# Patient Record
Sex: Female | Born: 1975
Health system: Southern US, Community
[De-identification: ages and names within clinical notes are randomized; demographics above are authoritative.]

## PROBLEM LIST (undated history)

## (undated) DIAGNOSIS — A159 Respiratory tuberculosis unspecified: Secondary | ICD-10-CM

## (undated) DIAGNOSIS — I10 Essential (primary) hypertension: Secondary | ICD-10-CM

## (undated) DIAGNOSIS — M1990 Unspecified osteoarthritis, unspecified site: Secondary | ICD-10-CM

## (undated) DIAGNOSIS — K219 Gastro-esophageal reflux disease without esophagitis: Secondary | ICD-10-CM

## (undated) DIAGNOSIS — D573 Sickle-cell trait: Secondary | ICD-10-CM

## (undated) DIAGNOSIS — E119 Type 2 diabetes mellitus without complications: Secondary | ICD-10-CM

## (undated) DIAGNOSIS — J45909 Unspecified asthma, uncomplicated: Secondary | ICD-10-CM

## (undated) DIAGNOSIS — G4730 Sleep apnea, unspecified: Secondary | ICD-10-CM

## (undated) DIAGNOSIS — E785 Hyperlipidemia, unspecified: Secondary | ICD-10-CM

## (undated) DIAGNOSIS — R42 Dizziness and giddiness: Secondary | ICD-10-CM

## (undated) HISTORY — DX: Sickle-cell trait: D57.3

## (undated) HISTORY — DX: Type 2 diabetes mellitus without complications: E11.9

## (undated) HISTORY — DX: Dizziness and giddiness: R42

## (undated) HISTORY — DX: Unspecified osteoarthritis, unspecified site: M19.90

## (undated) HISTORY — DX: Essential (primary) hypertension: I10

## (undated) HISTORY — PX: HAND SURGERY: SHX662

## (undated) HISTORY — DX: Unspecified asthma, uncomplicated: J45.909

## (undated) HISTORY — DX: Hyperlipidemia, unspecified: E78.5

---

## 2009-11-01 HISTORY — PX: LUMBAR DISC SURGERY: SHX700

## 2014-09-17 DIAGNOSIS — N926 Irregular menstruation, unspecified: Secondary | ICD-10-CM | POA: Insufficient documentation

## 2018-02-28 ENCOUNTER — Encounter: Payer: Medicare Other | Attending: Internal Medicine | Admitting: Dietician

## 2018-02-28 DIAGNOSIS — Z6841 Body Mass Index (BMI) 40.0 and over, adult: Secondary | ICD-10-CM | POA: Diagnosis not present

## 2018-02-28 DIAGNOSIS — E119 Type 2 diabetes mellitus without complications: Secondary | ICD-10-CM | POA: Insufficient documentation

## 2018-02-28 DIAGNOSIS — Z713 Dietary counseling and surveillance: Secondary | ICD-10-CM | POA: Insufficient documentation

## 2018-03-01 NOTE — Progress Notes (Signed)
Patient was seen on 02/28/18 for the first of a series of three diabetes self-management courses at the [facility].  Patient Education Plan per assessed needs and concerns is to attend three course education program for Diabetes Self Management Education.  The following learning objectives were met by the patient during this class:  Describe diabetes  State some common risk factors for diabetes  Defines the role of glucose and insulin  Identifies type of diabetes and pathophysiology  Describe the relationship between diabetes and cardiovascular risk  State the members of the Healthcare Team  States the rationale for glucose monitoring  State when to test glucose  State their individual Target Range  State the importance of logging glucose readings  Describe how to interpret glucose readings  Identifies A1C target  Explain the correlation between A1c and eAG values  State symptoms and treatment of high blood glucose  State symptoms and treatment of low blood glucose  Explain proper technique for glucose testing  Identifies proper sharps disposal  Handouts given during class include:  ADA Diabetes You Take Control   Carb Counting and Meal Planning book  Meal Plan Card  Meal planning worksheet  Low Sodium Flavoring Tips  Types of Fats  The diabetes portion plate  A1c to eAG Conversion Chart  Diabetes Recommended Care Schedule  Support Group  Diabetes Success Plan  Core Class Satisfaction Survey   Follow-Up Plan:  Attend core 2   

## 2018-03-02 ENCOUNTER — Encounter: Payer: Self-pay | Admitting: Dietician

## 2018-03-07 ENCOUNTER — Encounter: Payer: Medicare HMO | Attending: Internal Medicine | Admitting: Dietician

## 2018-03-07 DIAGNOSIS — Z713 Dietary counseling and surveillance: Secondary | ICD-10-CM | POA: Diagnosis not present

## 2018-03-07 DIAGNOSIS — E119 Type 2 diabetes mellitus without complications: Secondary | ICD-10-CM

## 2018-03-07 DIAGNOSIS — Z6841 Body Mass Index (BMI) 40.0 and over, adult: Secondary | ICD-10-CM | POA: Insufficient documentation

## 2018-03-07 NOTE — Progress Notes (Signed)
Patient was seen on 03/07/18 for the second of a series of three diabetes self-management courses at the [facility]. The following learning objectives were met by the patient during this class:   Describe the role of different macronutrients on glucose  Explain how carbohydrates affect blood glucose  State what foods contain the most carbohydrates  Demonstrate carbohydrate counting  Demonstrate how to read Nutrition Facts food label  Describe effects of various fats on heart health  Describe the importance of good nutrition for health and healthy eating strategies  Describe techniques for managing your shopping, cooking and meal planning  List strategies to follow meal plan when dining out  Describe the effects of alcohol on glucose and how to use it safely  Goals:  Follow Diabetes Meal Plan as instructed  Aim to spread carbs evenly throughout the day  Aim for 3 meals per day and snacks as needed Include lean protein foods to meals/snacks  Monitor glucose levels as instructed by your doctor   Follow-Up Plan:  Attend Core 3  Work towards following your personal food plan.   

## 2018-03-14 ENCOUNTER — Ambulatory Visit: Payer: Self-pay

## 2018-03-20 DIAGNOSIS — R6889 Other general symptoms and signs: Secondary | ICD-10-CM | POA: Diagnosis not present

## 2018-03-20 DIAGNOSIS — M545 Low back pain: Secondary | ICD-10-CM | POA: Diagnosis not present

## 2018-03-20 DIAGNOSIS — Z79899 Other long term (current) drug therapy: Secondary | ICD-10-CM | POA: Diagnosis not present

## 2018-03-20 DIAGNOSIS — G8929 Other chronic pain: Secondary | ICD-10-CM | POA: Diagnosis not present

## 2018-03-20 DIAGNOSIS — G9050 Complex regional pain syndrome I, unspecified: Secondary | ICD-10-CM | POA: Diagnosis not present

## 2018-03-21 DIAGNOSIS — R6889 Other general symptoms and signs: Secondary | ICD-10-CM | POA: Diagnosis not present

## 2018-03-24 DIAGNOSIS — R6889 Other general symptoms and signs: Secondary | ICD-10-CM | POA: Diagnosis not present

## 2018-03-24 DIAGNOSIS — E1165 Type 2 diabetes mellitus with hyperglycemia: Secondary | ICD-10-CM | POA: Diagnosis not present

## 2018-03-28 DIAGNOSIS — R6889 Other general symptoms and signs: Secondary | ICD-10-CM | POA: Diagnosis not present

## 2018-03-30 DIAGNOSIS — R6889 Other general symptoms and signs: Secondary | ICD-10-CM | POA: Diagnosis not present

## 2018-03-31 DIAGNOSIS — R6889 Other general symptoms and signs: Secondary | ICD-10-CM | POA: Diagnosis not present

## 2018-04-03 ENCOUNTER — Encounter: Payer: Self-pay | Admitting: Neurology

## 2018-04-03 DIAGNOSIS — G9050 Complex regional pain syndrome I, unspecified: Secondary | ICD-10-CM | POA: Diagnosis not present

## 2018-04-03 DIAGNOSIS — Z79899 Other long term (current) drug therapy: Secondary | ICD-10-CM | POA: Diagnosis not present

## 2018-04-03 DIAGNOSIS — R6889 Other general symptoms and signs: Secondary | ICD-10-CM | POA: Diagnosis not present

## 2018-04-03 DIAGNOSIS — G8929 Other chronic pain: Secondary | ICD-10-CM | POA: Diagnosis not present

## 2018-04-03 DIAGNOSIS — M545 Low back pain: Secondary | ICD-10-CM | POA: Diagnosis not present

## 2018-04-04 ENCOUNTER — Ambulatory Visit (INDEPENDENT_AMBULATORY_CARE_PROVIDER_SITE_OTHER): Payer: Medicare HMO | Admitting: Neurology

## 2018-04-04 ENCOUNTER — Encounter: Payer: Self-pay | Admitting: Neurology

## 2018-04-04 VITALS — BP 160/107 | HR 88 | Ht 63.0 in | Wt 270.0 lb

## 2018-04-04 DIAGNOSIS — T4275XA Adverse effect of unspecified antiepileptic and sedative-hypnotic drugs, initial encounter: Secondary | ICD-10-CM

## 2018-04-04 DIAGNOSIS — F513 Sleepwalking [somnambulism]: Secondary | ICD-10-CM

## 2018-04-04 DIAGNOSIS — R6889 Other general symptoms and signs: Secondary | ICD-10-CM | POA: Diagnosis not present

## 2018-04-04 DIAGNOSIS — G4719 Other hypersomnia: Secondary | ICD-10-CM

## 2018-04-04 DIAGNOSIS — Z9189 Other specified personal risk factors, not elsewhere classified: Secondary | ICD-10-CM

## 2018-04-04 DIAGNOSIS — R0683 Snoring: Secondary | ICD-10-CM | POA: Diagnosis not present

## 2018-04-04 DIAGNOSIS — F19982 Other psychoactive substance use, unspecified with psychoactive substance-induced sleep disorder: Secondary | ICD-10-CM

## 2018-04-04 DIAGNOSIS — R5383 Other fatigue: Secondary | ICD-10-CM

## 2018-04-04 DIAGNOSIS — Z6841 Body Mass Index (BMI) 40.0 and over, adult: Secondary | ICD-10-CM | POA: Diagnosis not present

## 2018-04-04 HISTORY — DX: Other fatigue: R53.83

## 2018-04-04 NOTE — Progress Notes (Signed)
[facility]INE CL[facility]IC   Provider:  Larey Seat, [place] D  Primary Care Physician:  Merrilee Seashore, MD   Referring Provider: Merrilee Seashore, MD    Chief Complaint  Patient presents with  . New Patient (Initial Visit)    Rm 11.     HPI:  Lindsay Ellis is a 42 y.o. female patient  , seen here as in a referral on 04-04-2018  from Dr. [name] for a sleep apnea work up. The patient is excessively daytime sleepy.   Lindsay Ellis presents today as a 42 year old African-American right-handed female currently wearing a hand brace wrist brace for the treatment of  RSD. She has struggled with diabetes and obesity for much of her adult life, and recently moved to [place] in October 2018.  She lived in [place] before for several years earlier she had undergone a sleep study and was diagnosed with obstructive sleep apnea, provided a CPAP machine but this got lost in the move.  Besides diabetes and morbid obesity she has problems with high cholesterol, joint pain aching muscles numbness dizziness and has been told that she snores.  She would like to resume CPAP treatment but will undergo with me to undergo a new sleep study for it.  Aside from her medical history I reviewed her medication list the patient is currently on a high dose of gabapentin she states she is taking she is taking 1500 mg 3 times a day a total of 4.5 g daily.  She is taking hydrochlorothiazide, meclizine, meloxicam as needed, Singulair as needed, baclofen 30 mg 3 times a day, Lipitor and Elavil.  Elavil is at 100 mg at bedtime.  It is supposed to help her sleep 2.  Aside from Symbicort she has also a pro-air inhaler as needed.  She reports that she has today an elevated blood pressure but this is not unusual for her.  She was diagnosed with essential hypertension, she believes that her blood pressure is elevated but in pain.  She also carries a sickle cell trait not the disease.  She has not used insulin in the past for  treatment of diabetes.  She reports frequent vertigo.  She relates her vertigo is also related to her blood pressure.  She denies any headaches associated with high blood pressures.   Sleep habits are as follows: She takes her evening medications at 8 Pm, and is asleep by 10 Pm, but has nocturia 3-5 times every night, has been woken air hungry, and frequent vomiting at night.  The patient usually retreats to her bedroom before 10 PM and with the help of medication finds herself ready to sleep soon, she sleeps on 2 pillows on a flat mattress, the bedroom is described as cool, quiet and dark.  She is often so drowsy that she even falls asleep on the commode, has no trouble to get back to sleep after her frequent bathroom breaks.  More trouble some of the arousals from air hunger weakness, the feeling of choking or suffocations. She is panicked after these arousals 2-3 times a week.  She cannot recall dreaming. She has been told she snores thunderously, she sleeps prone, but turns each night on her back- and snores and has apnea. .  She rises at 6. 45 AM to get her 2 children ready for school. She has custody of her 34 year old granddaughter, too. She is tired, she does not feel refreshed or restored in the mornings.  She denies any morning headaches,  and she has not been woken by headaches out of sleep.  She wakes with a parched, dry mouth.  She has been woken by palpitations, feeling clammy and diaphoretic, feeling panicked. She reports she takes naps in daytime as many as she can, as long as she can.  Often these are following the irresistible urge to sleep she did not schedule her naps or plan to nap.  A nap can take an hour.  She does not feel that power naps of less duration would refresher at all.  Sleep medical history and family sleep history: adopted. Was a sleep walker as a young adult and until recently.   Social history: 2 biological children and one grandchild in her custody, moved for [place] to [place]  in 10/ 2018.  She currently smokes about 5 to 6 cigarettes a day, has been a smoker since 20 years ago.  She does not drink alcohol, she does use caffeinated beverages.  She drinks 1 cup of coffee a day no sodas, no iced tea no energy drinks.   Review of Systems: Out of a complete 14 system review, the patient complains of only the following symptoms, and all other reviewed systems are negative.  How likely are you to doze in the following situations: 0 = not likely, 1 = slight chance, 2 = moderate chance, 3 = high chance  Sitting and Reading? 3 Watching Television? 3 Sitting inactive in a public place (theater or meeting)?2 As a passenger in a car for an hour without a break? 0  Lying down in the afternoon when circumstances permit? 3 Sitting and talking to someone? 2 Sitting quietly after lunch without alcohol? 2 In a car, while stopped for a few minutes in traffic? 0  Total = 15  She has no drivers licence.   Epworth score 15/ 24  , Fatigue severity score 56  , depression score 3/1 5   Social History   Socioeconomic History  . Marital status: Single    Spouse name: Not on file  . Number of children: Not on file  . Years of education: Not on file  . Highest education level: Not on file  Occupational History  . Not on file  Social Needs  . Financial resource strain: Not on file  . Food insecurity:    Worry: Not on file    Inability: Not on file  . Transportation needs:    Medical: Not on file    Non-medical: Not on file  Tobacco Use  . Smoking status: Current Every Day Smoker  . Smokeless tobacco: Never Used  Substance and Sexual Activity  . Alcohol use: Yes    Comment: occasionally  . Drug use: Never  . Sexual activity: Not on file  Lifestyle  . Physical activity:    Days per week: Not on file    Minutes per session: Not on file  . Stress: Not on file  Relationships  . Social connections:    Talks on phone: Not on file    Gets together: Not on file     Attends religious service: Not on file    Active member of club or organization: Not on file    Attends meetings of clubs or organizations: Not on file    Relationship status: Not on file  . Intimate partner violence:    Fear of current or ex partner: Not on file    Emotionally abused: Not on file    Physically abused: Not on file  Forced sexual activity: Not on file  Other Topics Concern  . Not on file  Social History Narrative  . Not on file    Family History  Problem Relation Age of Onset  . Lung cancer Mother     Past Medical History:  Diagnosis Date  . Asthma   . Diabetes mellitus without complication ([place])   . Hyperlipidemia   . Hypertension   . Osteoarthritis   . Sickle cell trait ([place])   . Vertigo     Past Surgical History:  Procedure Laterality Date  . CESAREAN SECTION    . LUMBAR DISC SURGERY  2011    Current Outpatient Medications  Medication Sig Dispense Refill  . amitriptyline (ELAVIL) 100 MG tablet Take 100 mg by mouth at bedtime.    Marland Kitchen atorvastatin (LIPITOR) 10 MG tablet Take 10 mg by mouth daily.    . baclofen (LIORESAL) 20 MG tablet Take 30 mg by mouth 3 (three) times daily.    . Calcium Carbonate-Vit D-Min (CALCIUM/VITAMIN D/MINERALS) 600-200 MG-UNIT TABS Take by mouth.    . ferrous sulfate 325 (65 FE) MG tablet Take 325 mg by mouth daily with breakfast.    . gabapentin (NEURONTIN) 800 MG tablet Take 1,500 mg by mouth 3 (three) times daily.    . hydrochlorothiazide (HYDRODIURIL) 12.5 MG tablet Take 12.5 mg by mouth daily.    . meclizine (ANTIVERT) 25 MG tablet Take 25 mg by mouth 3 (three) times daily as needed for dizziness.    . meloxicam (MOBIC) 15 MG tablet Take 15 mg by mouth daily.    . montelukast (SINGULAIR) 10 MG tablet Take 10 mg by mouth at bedtime.    . Multiple Vitamin (MULTIVITAMIN) tablet Take 1 tablet by mouth daily.    Marland Kitchen PROAIR HFA 108 (90 Base) MCG/ACT inhaler INL 2 PFS PO Q 4 H PRN  6  . SYMBICORT 160-4.5 MCG/ACT inhaler INL 2  PFS PO BID  6   No current facility-administered medications for this visit.     Allergies as of 04/04/2018 - Review Complete 04/04/2018  Allergen Reaction Noted  . Morphine and related  03/01/2018    Vitals: BP (!) 160/107   Pulse 88   Ht 5\' 3"  (1.6 m)   Wt 270 lb (122.5 kg)   BMI 47.83 kg/m  Last Weight:  Wt Readings from Last 1 Encounters:  04/04/18 270 lb (122.5 kg)   YNW:GNFA mass index is 47.83 kg/m.     Last Height:   Ht Readings from Last 1 Encounters:  04/04/18 5\' 3"  (1.6 m)    Physical exam:  General: The patient is awake, alert and appears not in acute distress. The patient is well groomed. Head: Normocephalic, atraumatic. Neck is supple. Mallampati 5  neck circumference:18. 5 - Nasal airflow . TMJ not  evident . Retrognathia is seen.  Cardiovascular:  Regular rate and rhythm , without  murmurs or carotid bruit, and without distended neck veins. Respiratory: Lungs are clear to auscultation. Skin:  Without evidence of edema, or rash Trunk: BMI is morbidly elevated.  Neurologic exam : The patient is awake and alert, oriented to place and time.   Attention span & concentration ability appears normal.  Speech is fluent,  without dysarthria, dysphonia or aphasia.  Mood and affect are appropriate.  Cranial nerves: Pupils are equal and briskly reactive to light.  Funduscopic exam without evidence of pallor or edema.  Extraocular movements  in vertical and horizontal planes intact and without nystagmus. Visual  fields by finger perimetry are intact. Hearing to finger rub intact. Facial sensation intact to fine touch.  Facial motor strength is symmetric and tongue and uvula move midline. Shoulder shrug was symmetrical.   Motor exam: Normal tone, muscle bulk and symmetric strength in all extremities. Sensory:  Fine touch, pinprick and vibration were tested in all extremities. Proprioception tested in the upper extremities was normal. Coordination: Rapid alternating  movements in the fingers/hands were deferred - RSD - dr Vira Blanco.  Gait and station: Patient walks without assistive device , wide based . Turns with 4-5 Steps.  Deep tendon reflexes: in the  upper and lower extremities are symmetric and intact.     Assessment:  After physical and neurologic examination, review of laboratory studies,  Personal review of imaging studies, reports of other /same  Imaging studies, results of polysomnography and / or neurophysiology testing and pre-existing records as far as provided in visit., my assessment is:   1) excessive daytime sleepiness,   attributed to untreated OSA, last sleep study at [facility]. Needs retesting by SPLIT study to regain another CPAP. Her other risk factor for EDS is in her medications, the patient takes a muscle relaxant medication, try cyclic antidepressants which also work on muscle relaxation and tension pain, Neurontin which helps with his chronic pain but is also sedative, and even tramadol which is a low-dose narcotic.  2) HTN, attributed to super obesity and pain. May be essential. Can improve under OSA therapy.   3) Sleep walking, often leading her to the kitchen , sleep eating. Promoted by current medications ?  By OSA related sleep fragmentation. Needs parasomnia montage. Marland Kitchen   4) DM , poorly controlled, related to BMI- DM just being diagnosed. Referred to nutrional services by Dr [name].     The patient was advised of the nature of the diagnosed disorder , the treatment options and the  risks for general health and wellness arising from not treating the condition.   I spent more than 45 minutes of face to face time with the patient.  Greater than 50% of time was spent in counseling and coordination of care. We have discussed the diagnosis and differential and I answered the patient's questions.    Plan:  Treatment plan and additional workup : risk for OSA and CSA=   Parasomnia montage -SPLIT night at [facility], 4 % .  [employer]  Sleep walking . Consider Dr. Migdalia Dk services for weight management.   Patient is not driving- Epworth 15 would preclude her forom driving.   Larey Seat, MD 0/03/1832, 58:25 AM  Certified in Neurology by ABPN Certified in [place] by [facility] [address] [place], [place] [address]

## 2018-04-05 DIAGNOSIS — R6889 Other general symptoms and signs: Secondary | ICD-10-CM | POA: Diagnosis not present

## 2018-04-13 DIAGNOSIS — M546 Pain in thoracic spine: Secondary | ICD-10-CM | POA: Diagnosis not present

## 2018-04-13 DIAGNOSIS — M545 Low back pain: Secondary | ICD-10-CM | POA: Diagnosis not present

## 2018-04-13 DIAGNOSIS — R6889 Other general symptoms and signs: Secondary | ICD-10-CM | POA: Diagnosis not present

## 2018-04-13 DIAGNOSIS — M542 Cervicalgia: Secondary | ICD-10-CM | POA: Diagnosis not present

## 2018-04-13 DIAGNOSIS — M25551 Pain in right hip: Secondary | ICD-10-CM | POA: Diagnosis not present

## 2018-04-17 DIAGNOSIS — M546 Pain in thoracic spine: Secondary | ICD-10-CM | POA: Diagnosis not present

## 2018-04-17 DIAGNOSIS — M25551 Pain in right hip: Secondary | ICD-10-CM | POA: Diagnosis not present

## 2018-04-17 DIAGNOSIS — M542 Cervicalgia: Secondary | ICD-10-CM | POA: Diagnosis not present

## 2018-04-17 DIAGNOSIS — M545 Low back pain: Secondary | ICD-10-CM | POA: Diagnosis not present

## 2018-04-17 DIAGNOSIS — R6889 Other general symptoms and signs: Secondary | ICD-10-CM | POA: Diagnosis not present

## 2018-04-19 DIAGNOSIS — Z0100 Encounter for examination of eyes and vision without abnormal findings: Secondary | ICD-10-CM | POA: Diagnosis not present

## 2018-04-20 DIAGNOSIS — R6889 Other general symptoms and signs: Secondary | ICD-10-CM | POA: Diagnosis not present

## 2018-04-21 DIAGNOSIS — M25551 Pain in right hip: Secondary | ICD-10-CM | POA: Diagnosis not present

## 2018-04-21 DIAGNOSIS — R6889 Other general symptoms and signs: Secondary | ICD-10-CM | POA: Diagnosis not present

## 2018-04-21 DIAGNOSIS — M546 Pain in thoracic spine: Secondary | ICD-10-CM | POA: Diagnosis not present

## 2018-04-21 DIAGNOSIS — M545 Low back pain: Secondary | ICD-10-CM | POA: Diagnosis not present

## 2018-04-21 DIAGNOSIS — M542 Cervicalgia: Secondary | ICD-10-CM | POA: Diagnosis not present

## 2018-04-24 DIAGNOSIS — M546 Pain in thoracic spine: Secondary | ICD-10-CM | POA: Diagnosis not present

## 2018-04-24 DIAGNOSIS — M545 Low back pain: Secondary | ICD-10-CM | POA: Diagnosis not present

## 2018-04-24 DIAGNOSIS — M542 Cervicalgia: Secondary | ICD-10-CM | POA: Diagnosis not present

## 2018-04-24 DIAGNOSIS — R6889 Other general symptoms and signs: Secondary | ICD-10-CM | POA: Diagnosis not present

## 2018-04-24 DIAGNOSIS — M25551 Pain in right hip: Secondary | ICD-10-CM | POA: Diagnosis not present

## 2018-04-25 ENCOUNTER — Encounter: Payer: Medicare HMO | Attending: Internal Medicine | Admitting: Dietician

## 2018-04-25 ENCOUNTER — Encounter: Payer: Self-pay | Admitting: Dietician

## 2018-04-25 DIAGNOSIS — Z6841 Body Mass Index (BMI) 40.0 and over, adult: Secondary | ICD-10-CM | POA: Insufficient documentation

## 2018-04-25 DIAGNOSIS — Z713 Dietary counseling and surveillance: Secondary | ICD-10-CM | POA: Insufficient documentation

## 2018-04-25 DIAGNOSIS — E119 Type 2 diabetes mellitus without complications: Secondary | ICD-10-CM | POA: Diagnosis not present

## 2018-04-25 DIAGNOSIS — R6889 Other general symptoms and signs: Secondary | ICD-10-CM | POA: Diagnosis not present

## 2018-04-25 NOTE — Progress Notes (Signed)
Patient was seen on 04/25/18 for the third of a series of three diabetes self-management courses at the [facility].   Lindsay Ellis the amount of activity recommended for healthy living . Describe activities suitable for individual needs . Identify ways to regularly incorporate activity into daily life . Identify barriers to activity and ways to over come these barriers  Identify diabetes medications being personally used and their primary action for lowering glucose and possible side effects . Describe role of stress on blood glucose and develop strategies to address psychosocial issues . Identify diabetes complications and ways to prevent them  Explain how to manage diabetes during illness . Evaluate success in meeting personal goal . Establish 2-3 goals that they will plan to diligently work on  Goals:   I will test my glucose at least 2 times a day, 5 days a week  I will look at patterns in my record book at least 15 days a month  To help manage stress I will  meditate at least 3 times a week  Your patient has identified these potential barriers to change:  Stress  Your patient has identified their diabetes self-care support plan as  Family Support   Plan:  Attend Support Group as desired

## 2018-04-26 DIAGNOSIS — M546 Pain in thoracic spine: Secondary | ICD-10-CM | POA: Diagnosis not present

## 2018-04-26 DIAGNOSIS — M542 Cervicalgia: Secondary | ICD-10-CM | POA: Diagnosis not present

## 2018-04-26 DIAGNOSIS — R6889 Other general symptoms and signs: Secondary | ICD-10-CM | POA: Diagnosis not present

## 2018-04-26 DIAGNOSIS — M25551 Pain in right hip: Secondary | ICD-10-CM | POA: Diagnosis not present

## 2018-04-26 DIAGNOSIS — M545 Low back pain: Secondary | ICD-10-CM | POA: Diagnosis not present

## 2018-04-28 DIAGNOSIS — M542 Cervicalgia: Secondary | ICD-10-CM | POA: Diagnosis not present

## 2018-04-28 DIAGNOSIS — R6889 Other general symptoms and signs: Secondary | ICD-10-CM | POA: Diagnosis not present

## 2018-04-28 DIAGNOSIS — M25551 Pain in right hip: Secondary | ICD-10-CM | POA: Diagnosis not present

## 2018-04-28 DIAGNOSIS — M545 Low back pain: Secondary | ICD-10-CM | POA: Diagnosis not present

## 2018-04-28 DIAGNOSIS — M546 Pain in thoracic spine: Secondary | ICD-10-CM | POA: Diagnosis not present

## 2018-05-01 DIAGNOSIS — M542 Cervicalgia: Secondary | ICD-10-CM | POA: Diagnosis not present

## 2018-05-01 DIAGNOSIS — M545 Low back pain: Secondary | ICD-10-CM | POA: Diagnosis not present

## 2018-05-01 DIAGNOSIS — M546 Pain in thoracic spine: Secondary | ICD-10-CM | POA: Diagnosis not present

## 2018-05-01 DIAGNOSIS — M25551 Pain in right hip: Secondary | ICD-10-CM | POA: Diagnosis not present

## 2018-05-01 DIAGNOSIS — R6889 Other general symptoms and signs: Secondary | ICD-10-CM | POA: Diagnosis not present

## 2018-05-02 DIAGNOSIS — M546 Pain in thoracic spine: Secondary | ICD-10-CM | POA: Diagnosis not present

## 2018-05-02 DIAGNOSIS — M25551 Pain in right hip: Secondary | ICD-10-CM | POA: Diagnosis not present

## 2018-05-02 DIAGNOSIS — R6889 Other general symptoms and signs: Secondary | ICD-10-CM | POA: Diagnosis not present

## 2018-05-02 DIAGNOSIS — M545 Low back pain: Secondary | ICD-10-CM | POA: Diagnosis not present

## 2018-05-02 DIAGNOSIS — M542 Cervicalgia: Secondary | ICD-10-CM | POA: Diagnosis not present

## 2018-05-05 DIAGNOSIS — M542 Cervicalgia: Secondary | ICD-10-CM | POA: Diagnosis not present

## 2018-05-05 DIAGNOSIS — R6889 Other general symptoms and signs: Secondary | ICD-10-CM | POA: Diagnosis not present

## 2018-05-05 DIAGNOSIS — M546 Pain in thoracic spine: Secondary | ICD-10-CM | POA: Diagnosis not present

## 2018-05-05 DIAGNOSIS — M545 Low back pain: Secondary | ICD-10-CM | POA: Diagnosis not present

## 2018-05-05 DIAGNOSIS — M25551 Pain in right hip: Secondary | ICD-10-CM | POA: Diagnosis not present

## 2018-05-08 ENCOUNTER — Other Ambulatory Visit: Payer: Self-pay | Admitting: Neurology

## 2018-05-08 ENCOUNTER — Telehealth: Payer: Self-pay

## 2018-05-08 DIAGNOSIS — M25551 Pain in right hip: Secondary | ICD-10-CM | POA: Diagnosis not present

## 2018-05-08 DIAGNOSIS — F19982 Other psychoactive substance use, unspecified with psychoactive substance-induced sleep disorder: Secondary | ICD-10-CM

## 2018-05-08 DIAGNOSIS — R6889 Other general symptoms and signs: Secondary | ICD-10-CM | POA: Diagnosis not present

## 2018-05-08 DIAGNOSIS — M546 Pain in thoracic spine: Secondary | ICD-10-CM | POA: Diagnosis not present

## 2018-05-08 DIAGNOSIS — R0683 Snoring: Secondary | ICD-10-CM

## 2018-05-08 DIAGNOSIS — M542 Cervicalgia: Secondary | ICD-10-CM | POA: Diagnosis not present

## 2018-05-08 DIAGNOSIS — M545 Low back pain: Secondary | ICD-10-CM | POA: Diagnosis not present

## 2018-05-08 DIAGNOSIS — G4719 Other hypersomnia: Secondary | ICD-10-CM

## 2018-05-08 NOTE — Telephone Encounter (Signed)
[employer] denied in lab sleep study, need HST order 

## 2018-05-09 DIAGNOSIS — M159 Polyosteoarthritis, unspecified: Secondary | ICD-10-CM | POA: Diagnosis not present

## 2018-05-09 DIAGNOSIS — G9050 Complex regional pain syndrome I, unspecified: Secondary | ICD-10-CM | POA: Diagnosis not present

## 2018-05-09 DIAGNOSIS — R6889 Other general symptoms and signs: Secondary | ICD-10-CM | POA: Diagnosis not present

## 2018-05-12 DIAGNOSIS — M545 Low back pain: Secondary | ICD-10-CM | POA: Diagnosis not present

## 2018-05-12 DIAGNOSIS — M25551 Pain in right hip: Secondary | ICD-10-CM | POA: Diagnosis not present

## 2018-05-12 DIAGNOSIS — M542 Cervicalgia: Secondary | ICD-10-CM | POA: Diagnosis not present

## 2018-05-12 DIAGNOSIS — R6889 Other general symptoms and signs: Secondary | ICD-10-CM | POA: Diagnosis not present

## 2018-05-12 DIAGNOSIS — M546 Pain in thoracic spine: Secondary | ICD-10-CM | POA: Diagnosis not present

## 2018-05-15 DIAGNOSIS — M25551 Pain in right hip: Secondary | ICD-10-CM | POA: Diagnosis not present

## 2018-05-15 DIAGNOSIS — M542 Cervicalgia: Secondary | ICD-10-CM | POA: Diagnosis not present

## 2018-05-15 DIAGNOSIS — R6889 Other general symptoms and signs: Secondary | ICD-10-CM | POA: Diagnosis not present

## 2018-05-15 DIAGNOSIS — M546 Pain in thoracic spine: Secondary | ICD-10-CM | POA: Diagnosis not present

## 2018-05-15 DIAGNOSIS — M545 Low back pain: Secondary | ICD-10-CM | POA: Diagnosis not present

## 2018-05-17 DIAGNOSIS — M545 Low back pain: Secondary | ICD-10-CM | POA: Diagnosis not present

## 2018-05-17 DIAGNOSIS — M25551 Pain in right hip: Secondary | ICD-10-CM | POA: Diagnosis not present

## 2018-05-17 DIAGNOSIS — M546 Pain in thoracic spine: Secondary | ICD-10-CM | POA: Diagnosis not present

## 2018-05-17 DIAGNOSIS — R6889 Other general symptoms and signs: Secondary | ICD-10-CM | POA: Diagnosis not present

## 2018-05-17 DIAGNOSIS — M542 Cervicalgia: Secondary | ICD-10-CM | POA: Diagnosis not present

## 2018-05-19 DIAGNOSIS — R6889 Other general symptoms and signs: Secondary | ICD-10-CM | POA: Diagnosis not present

## 2018-05-19 DIAGNOSIS — M545 Low back pain: Secondary | ICD-10-CM | POA: Diagnosis not present

## 2018-05-19 DIAGNOSIS — M546 Pain in thoracic spine: Secondary | ICD-10-CM | POA: Diagnosis not present

## 2018-05-19 DIAGNOSIS — M542 Cervicalgia: Secondary | ICD-10-CM | POA: Diagnosis not present

## 2018-05-19 DIAGNOSIS — M25551 Pain in right hip: Secondary | ICD-10-CM | POA: Diagnosis not present

## 2018-05-24 DIAGNOSIS — M25551 Pain in right hip: Secondary | ICD-10-CM | POA: Diagnosis not present

## 2018-05-24 DIAGNOSIS — M542 Cervicalgia: Secondary | ICD-10-CM | POA: Diagnosis not present

## 2018-05-24 DIAGNOSIS — M546 Pain in thoracic spine: Secondary | ICD-10-CM | POA: Diagnosis not present

## 2018-05-24 DIAGNOSIS — M545 Low back pain: Secondary | ICD-10-CM | POA: Diagnosis not present

## 2018-05-24 DIAGNOSIS — R6889 Other general symptoms and signs: Secondary | ICD-10-CM | POA: Diagnosis not present

## 2018-05-26 DIAGNOSIS — M546 Pain in thoracic spine: Secondary | ICD-10-CM | POA: Diagnosis not present

## 2018-05-26 DIAGNOSIS — M542 Cervicalgia: Secondary | ICD-10-CM | POA: Diagnosis not present

## 2018-05-26 DIAGNOSIS — M25551 Pain in right hip: Secondary | ICD-10-CM | POA: Diagnosis not present

## 2018-05-26 DIAGNOSIS — R6889 Other general symptoms and signs: Secondary | ICD-10-CM | POA: Diagnosis not present

## 2018-05-26 DIAGNOSIS — M545 Low back pain: Secondary | ICD-10-CM | POA: Diagnosis not present

## 2018-05-29 ENCOUNTER — Ambulatory Visit (INDEPENDENT_AMBULATORY_CARE_PROVIDER_SITE_OTHER): Payer: Medicare HMO | Admitting: Neurology

## 2018-05-29 DIAGNOSIS — F19982 Other psychoactive substance use, unspecified with psychoactive substance-induced sleep disorder: Secondary | ICD-10-CM

## 2018-05-29 DIAGNOSIS — R0683 Snoring: Secondary | ICD-10-CM

## 2018-05-29 DIAGNOSIS — G4733 Obstructive sleep apnea (adult) (pediatric): Secondary | ICD-10-CM | POA: Diagnosis not present

## 2018-05-29 DIAGNOSIS — G4750 Parasomnia, unspecified: Secondary | ICD-10-CM

## 2018-05-29 DIAGNOSIS — G4719 Other hypersomnia: Secondary | ICD-10-CM

## 2018-05-29 DIAGNOSIS — R6889 Other general symptoms and signs: Secondary | ICD-10-CM | POA: Diagnosis not present

## 2018-05-31 DIAGNOSIS — M25551 Pain in right hip: Secondary | ICD-10-CM | POA: Diagnosis not present

## 2018-05-31 DIAGNOSIS — R6889 Other general symptoms and signs: Secondary | ICD-10-CM | POA: Diagnosis not present

## 2018-05-31 DIAGNOSIS — M542 Cervicalgia: Secondary | ICD-10-CM | POA: Diagnosis not present

## 2018-05-31 DIAGNOSIS — M545 Low back pain: Secondary | ICD-10-CM | POA: Diagnosis not present

## 2018-05-31 DIAGNOSIS — M546 Pain in thoracic spine: Secondary | ICD-10-CM | POA: Diagnosis not present

## 2018-06-01 DIAGNOSIS — M542 Cervicalgia: Secondary | ICD-10-CM | POA: Diagnosis not present

## 2018-06-01 DIAGNOSIS — M545 Low back pain: Secondary | ICD-10-CM | POA: Diagnosis not present

## 2018-06-01 DIAGNOSIS — M546 Pain in thoracic spine: Secondary | ICD-10-CM | POA: Diagnosis not present

## 2018-06-01 DIAGNOSIS — R6889 Other general symptoms and signs: Secondary | ICD-10-CM | POA: Diagnosis not present

## 2018-06-01 DIAGNOSIS — M25551 Pain in right hip: Secondary | ICD-10-CM | POA: Diagnosis not present

## 2018-06-02 DIAGNOSIS — R6889 Other general symptoms and signs: Secondary | ICD-10-CM | POA: Diagnosis not present

## 2018-06-02 DIAGNOSIS — I10 Essential (primary) hypertension: Secondary | ICD-10-CM | POA: Diagnosis not present

## 2018-06-02 DIAGNOSIS — E1165 Type 2 diabetes mellitus with hyperglycemia: Secondary | ICD-10-CM | POA: Diagnosis not present

## 2018-06-06 DIAGNOSIS — G4750 Parasomnia, unspecified: Secondary | ICD-10-CM | POA: Insufficient documentation

## 2018-06-06 DIAGNOSIS — R0683 Snoring: Secondary | ICD-10-CM | POA: Insufficient documentation

## 2018-06-06 DIAGNOSIS — G4733 Obstructive sleep apnea (adult) (pediatric): Secondary | ICD-10-CM | POA: Insufficient documentation

## 2018-06-06 NOTE — Procedures (Signed)
[facility] [place], [place] [address] NAME: Lindsay Ellis                                                                 DOB: 07/14/1976 MEDICAL RECORD NUMBER  [id number]                                               DOS:  05/31/2018 REFERRING PHYSICIAN: Merrilee Seashore STUDY PERFORMED: Home Sleep Study on apnea link HISTORY:   Lindsay Ellis is reportedly excessively daytime sleepy. Lindsay Ellis presents as a 42 year old African-American right-handed female currently wearing a hand brace wrist brace for the treatment of RSD. She has struggled with diabetes and obesity for much of her adult life, and recently moved to [place] in October 2018.  She lived in [place] and there had undergone a sleep study , was diagnosed with obstructive sleep apnea, was provided a CPAP machine but lost the machine during the move. Besides diabetes and morbid obesity she has high cholesterol, joint pain, aching muscles, numbness, dizziness, and has been told that she snores.  She would like to resume CPAP treatment.  She has essential hypertension- her blood pressure is elevated when in pain. She also carries a sickle cell trait - not the disease. She reports sleep walking, sleep eating, frequent vertigo - her vertigo may also relate to her blood pressure. She denies any headaches. Epworth Sleepiness score endorsed at 15/ 24 points, Fatigue Severity Score 56/63 points.   BMI: 47.8  STUDY RESULTS:  Total Recording Time: 7 hours 34 minutes, valid test time 6h and 28 minutes. Total Apnea/Hypopnea Index (AHI):  65.5/h; RDI: 67.5/h. Average Oxygen Saturation:  91 %; Lowest Oxygen Desaturation: 77 %. Total Time in Oxygen Saturation below 89 %: 62.0 minutes =18%. Average Heart Rate:  93 bpm (between 49 and 122 bpm). IMPRESSION:  Severe Sleep apnea, Thunderous snoring, and prolonged hypoxemia with tachy bradycardia response.  RECOMMENDATION: I would prefer an attended  titration to CPAP/ BiPAP, allowing to change modality, interface, and to add oxygen if needed. This degree of apnea and hypoxemia is not treated with a dental device.  I certify that I have reviewed the raw data recording prior to the issuance of this report in accordance with the standards of the American Academy of Sleep Medicine (AASM). Larey Seat, M.D.   06-06-2018    Medical Director of [facility] at [facility], accredited by the AASM. Diplomat of the ABPN and ABSM.

## 2018-06-06 NOTE — Addendum Note (Signed)
Addended by: Larey Seat on: 06/06/2018 06:59 PM   Modules accepted: Orders

## 2018-06-07 DIAGNOSIS — M159 Polyosteoarthritis, unspecified: Secondary | ICD-10-CM | POA: Diagnosis not present

## 2018-06-07 DIAGNOSIS — R6889 Other general symptoms and signs: Secondary | ICD-10-CM | POA: Diagnosis not present

## 2018-06-07 DIAGNOSIS — G9050 Complex regional pain syndrome I, unspecified: Secondary | ICD-10-CM | POA: Diagnosis not present

## 2018-06-07 DIAGNOSIS — G8929 Other chronic pain: Secondary | ICD-10-CM | POA: Diagnosis not present

## 2018-06-07 DIAGNOSIS — Z79899 Other long term (current) drug therapy: Secondary | ICD-10-CM | POA: Diagnosis not present

## 2018-06-08 ENCOUNTER — Telehealth: Payer: Self-pay

## 2018-06-08 ENCOUNTER — Telehealth: Payer: Self-pay | Admitting: Neurology

## 2018-06-08 DIAGNOSIS — G4733 Obstructive sleep apnea (adult) (pediatric): Secondary | ICD-10-CM

## 2018-06-08 DIAGNOSIS — G4750 Parasomnia, unspecified: Secondary | ICD-10-CM

## 2018-06-08 DIAGNOSIS — T4275XA Adverse effect of unspecified antiepileptic and sedative-hypnotic drugs, initial encounter: Secondary | ICD-10-CM

## 2018-06-08 DIAGNOSIS — R5383 Other fatigue: Secondary | ICD-10-CM

## 2018-06-08 DIAGNOSIS — F513 Sleepwalking [somnambulism]: Secondary | ICD-10-CM

## 2018-06-08 NOTE — Addendum Note (Signed)
Addended by: Larey Seat on: 06/08/2018 09:45 AM   Modules accepted: Orders

## 2018-06-08 NOTE — Telephone Encounter (Signed)
-----   Message from Larey Seat, MD sent at 06/06/2018  6:58 PM EDT ----- IMPRESSION: Severe Sleep apnea, Thunderous snoring, and  prolonged hypoxemia with tachy bradycardia response.  HST can not evaluate for parasomnia.   RECOMMENDATION: I would prefer an attended titration to CPAP/  BiPAP, allowing to change modality, interface, and to add oxygen  if needed. This degree of apnea and hypoxemia is not treated with  a dental device.

## 2018-06-08 NOTE — Telephone Encounter (Signed)
Insurance will deny the CPAP request. Pt doesn't meet the criteria for in lab titration study.  You can try to bring pt back into lab after auto titration and low O2 from ONO.

## 2018-06-08 NOTE — Telephone Encounter (Signed)
Insurance states they will have to treat the apnea first. Then if patient continues to have issues, we can bring in for a in lab sleep study.

## 2018-06-08 NOTE — Telephone Encounter (Signed)
I called Lindsay Ellis. I advised Lindsay Ellis that Dr. Brett Fairy reviewed their sleep study results and found that Lindsay Ellis has severe sleep apnea. Dr. Brett Fairy recommends that Lindsay Ellis starts auto CPAP. I reviewed PAP compliance expectations with the Lindsay Ellis. Lindsay Ellis is agreeable to starting a CPAP. I advised Lindsay Ellis that an order will be sent to a DME, [facility], and [facility] will call the Lindsay Ellis within about one week after they file with the Lindsay Ellis's insurance. [facility] will show the Lindsay Ellis how to use the machine, fit for masks, and troubleshoot the CPAP if needed. A follow up appt was made for insurance purposes with Dr. Brett Fairy on Nov 13,2019. Lindsay Ellis verbalized understanding to arrive 15 minutes early and bring their CPAP. A letter with all of this information in it will be mailed to the Lindsay Ellis as a reminder. I verified with the Lindsay Ellis that the address we have on file is correct. Lindsay Ellis verbalized understanding of results. Lindsay Ellis had no questions at this time but was encouraged to call back if questions arise.

## 2018-06-09 DIAGNOSIS — E1165 Type 2 diabetes mellitus with hyperglycemia: Secondary | ICD-10-CM | POA: Diagnosis not present

## 2018-06-09 DIAGNOSIS — J4520 Mild intermittent asthma, uncomplicated: Secondary | ICD-10-CM | POA: Diagnosis not present

## 2018-06-09 DIAGNOSIS — J4540 Moderate persistent asthma, uncomplicated: Secondary | ICD-10-CM | POA: Diagnosis not present

## 2018-06-09 DIAGNOSIS — R6889 Other general symptoms and signs: Secondary | ICD-10-CM | POA: Diagnosis not present

## 2018-06-09 DIAGNOSIS — G9050 Complex regional pain syndrome I, unspecified: Secondary | ICD-10-CM | POA: Diagnosis not present

## 2018-06-15 DIAGNOSIS — M25551 Pain in right hip: Secondary | ICD-10-CM | POA: Diagnosis not present

## 2018-06-15 DIAGNOSIS — M545 Low back pain: Secondary | ICD-10-CM | POA: Diagnosis not present

## 2018-06-15 DIAGNOSIS — R6889 Other general symptoms and signs: Secondary | ICD-10-CM | POA: Diagnosis not present

## 2018-06-15 DIAGNOSIS — M546 Pain in thoracic spine: Secondary | ICD-10-CM | POA: Diagnosis not present

## 2018-06-15 DIAGNOSIS — M542 Cervicalgia: Secondary | ICD-10-CM | POA: Diagnosis not present

## 2018-06-22 DIAGNOSIS — M545 Low back pain: Secondary | ICD-10-CM | POA: Diagnosis not present

## 2018-06-22 DIAGNOSIS — M546 Pain in thoracic spine: Secondary | ICD-10-CM | POA: Diagnosis not present

## 2018-06-22 DIAGNOSIS — R6889 Other general symptoms and signs: Secondary | ICD-10-CM | POA: Diagnosis not present

## 2018-06-22 DIAGNOSIS — M25551 Pain in right hip: Secondary | ICD-10-CM | POA: Diagnosis not present

## 2018-06-22 DIAGNOSIS — M542 Cervicalgia: Secondary | ICD-10-CM | POA: Diagnosis not present

## 2018-06-23 DIAGNOSIS — M25551 Pain in right hip: Secondary | ICD-10-CM | POA: Diagnosis not present

## 2018-06-23 DIAGNOSIS — M546 Pain in thoracic spine: Secondary | ICD-10-CM | POA: Diagnosis not present

## 2018-06-23 DIAGNOSIS — M545 Low back pain: Secondary | ICD-10-CM | POA: Diagnosis not present

## 2018-06-23 DIAGNOSIS — R6889 Other general symptoms and signs: Secondary | ICD-10-CM | POA: Diagnosis not present

## 2018-06-23 DIAGNOSIS — M542 Cervicalgia: Secondary | ICD-10-CM | POA: Diagnosis not present

## 2018-06-26 DIAGNOSIS — G4733 Obstructive sleep apnea (adult) (pediatric): Secondary | ICD-10-CM | POA: Diagnosis not present

## 2018-06-29 DIAGNOSIS — M546 Pain in thoracic spine: Secondary | ICD-10-CM | POA: Diagnosis not present

## 2018-06-29 DIAGNOSIS — M25551 Pain in right hip: Secondary | ICD-10-CM | POA: Diagnosis not present

## 2018-06-29 DIAGNOSIS — M545 Low back pain: Secondary | ICD-10-CM | POA: Diagnosis not present

## 2018-06-29 DIAGNOSIS — M542 Cervicalgia: Secondary | ICD-10-CM | POA: Diagnosis not present

## 2018-06-29 DIAGNOSIS — R6889 Other general symptoms and signs: Secondary | ICD-10-CM | POA: Diagnosis not present

## 2018-07-05 DIAGNOSIS — M25551 Pain in right hip: Secondary | ICD-10-CM | POA: Diagnosis not present

## 2018-07-05 DIAGNOSIS — R6889 Other general symptoms and signs: Secondary | ICD-10-CM | POA: Diagnosis not present

## 2018-07-05 DIAGNOSIS — M545 Low back pain: Secondary | ICD-10-CM | POA: Diagnosis not present

## 2018-07-05 DIAGNOSIS — M542 Cervicalgia: Secondary | ICD-10-CM | POA: Diagnosis not present

## 2018-07-05 DIAGNOSIS — M546 Pain in thoracic spine: Secondary | ICD-10-CM | POA: Diagnosis not present

## 2018-07-06 DIAGNOSIS — M546 Pain in thoracic spine: Secondary | ICD-10-CM | POA: Diagnosis not present

## 2018-07-06 DIAGNOSIS — R6889 Other general symptoms and signs: Secondary | ICD-10-CM | POA: Diagnosis not present

## 2018-07-06 DIAGNOSIS — M25551 Pain in right hip: Secondary | ICD-10-CM | POA: Diagnosis not present

## 2018-07-06 DIAGNOSIS — M545 Low back pain: Secondary | ICD-10-CM | POA: Diagnosis not present

## 2018-07-06 DIAGNOSIS — M542 Cervicalgia: Secondary | ICD-10-CM | POA: Diagnosis not present

## 2018-07-10 DIAGNOSIS — J4520 Mild intermittent asthma, uncomplicated: Secondary | ICD-10-CM | POA: Diagnosis not present

## 2018-07-10 DIAGNOSIS — M545 Low back pain: Secondary | ICD-10-CM | POA: Diagnosis not present

## 2018-07-10 DIAGNOSIS — Z79899 Other long term (current) drug therapy: Secondary | ICD-10-CM | POA: Diagnosis not present

## 2018-07-10 DIAGNOSIS — G8929 Other chronic pain: Secondary | ICD-10-CM | POA: Diagnosis not present

## 2018-07-10 DIAGNOSIS — R6889 Other general symptoms and signs: Secondary | ICD-10-CM | POA: Diagnosis not present

## 2018-07-13 DIAGNOSIS — M25551 Pain in right hip: Secondary | ICD-10-CM | POA: Diagnosis not present

## 2018-07-13 DIAGNOSIS — M545 Low back pain: Secondary | ICD-10-CM | POA: Diagnosis not present

## 2018-07-13 DIAGNOSIS — M546 Pain in thoracic spine: Secondary | ICD-10-CM | POA: Diagnosis not present

## 2018-07-13 DIAGNOSIS — R6889 Other general symptoms and signs: Secondary | ICD-10-CM | POA: Diagnosis not present

## 2018-07-13 DIAGNOSIS — M542 Cervicalgia: Secondary | ICD-10-CM | POA: Diagnosis not present

## 2018-07-20 DIAGNOSIS — M546 Pain in thoracic spine: Secondary | ICD-10-CM | POA: Diagnosis not present

## 2018-07-20 DIAGNOSIS — M545 Low back pain: Secondary | ICD-10-CM | POA: Diagnosis not present

## 2018-07-20 DIAGNOSIS — R6889 Other general symptoms and signs: Secondary | ICD-10-CM | POA: Diagnosis not present

## 2018-07-20 DIAGNOSIS — M25551 Pain in right hip: Secondary | ICD-10-CM | POA: Diagnosis not present

## 2018-07-20 DIAGNOSIS — M542 Cervicalgia: Secondary | ICD-10-CM | POA: Diagnosis not present

## 2018-07-21 DIAGNOSIS — M546 Pain in thoracic spine: Secondary | ICD-10-CM | POA: Diagnosis not present

## 2018-07-21 DIAGNOSIS — M542 Cervicalgia: Secondary | ICD-10-CM | POA: Diagnosis not present

## 2018-07-21 DIAGNOSIS — M25551 Pain in right hip: Secondary | ICD-10-CM | POA: Diagnosis not present

## 2018-07-21 DIAGNOSIS — R6889 Other general symptoms and signs: Secondary | ICD-10-CM | POA: Diagnosis not present

## 2018-07-21 DIAGNOSIS — M545 Low back pain: Secondary | ICD-10-CM | POA: Diagnosis not present

## 2018-07-27 DIAGNOSIS — G4733 Obstructive sleep apnea (adult) (pediatric): Secondary | ICD-10-CM | POA: Diagnosis not present

## 2018-08-09 DIAGNOSIS — J4520 Mild intermittent asthma, uncomplicated: Secondary | ICD-10-CM | POA: Diagnosis not present

## 2018-08-11 DIAGNOSIS — Z23 Encounter for immunization: Secondary | ICD-10-CM | POA: Diagnosis not present

## 2018-08-11 DIAGNOSIS — E1165 Type 2 diabetes mellitus with hyperglycemia: Secondary | ICD-10-CM | POA: Diagnosis not present

## 2018-08-11 DIAGNOSIS — I10 Essential (primary) hypertension: Secondary | ICD-10-CM | POA: Diagnosis not present

## 2018-08-11 DIAGNOSIS — R6889 Other general symptoms and signs: Secondary | ICD-10-CM | POA: Diagnosis not present

## 2018-08-11 DIAGNOSIS — E782 Mixed hyperlipidemia: Secondary | ICD-10-CM | POA: Diagnosis not present

## 2018-08-26 DIAGNOSIS — G4733 Obstructive sleep apnea (adult) (pediatric): Secondary | ICD-10-CM | POA: Diagnosis not present

## 2018-08-30 DIAGNOSIS — J4520 Mild intermittent asthma, uncomplicated: Secondary | ICD-10-CM | POA: Diagnosis not present

## 2018-09-13 ENCOUNTER — Ambulatory Visit (INDEPENDENT_AMBULATORY_CARE_PROVIDER_SITE_OTHER): Payer: Medicare HMO | Admitting: Neurology

## 2018-09-13 ENCOUNTER — Encounter: Payer: Self-pay | Admitting: Neurology

## 2018-09-13 VITALS — BP 153/104 | HR 84 | Ht 63.0 in | Wt 274.0 lb

## 2018-09-13 DIAGNOSIS — G9050 Complex regional pain syndrome I, unspecified: Secondary | ICD-10-CM

## 2018-09-13 DIAGNOSIS — G4733 Obstructive sleep apnea (adult) (pediatric): Secondary | ICD-10-CM

## 2018-09-13 DIAGNOSIS — G4719 Other hypersomnia: Secondary | ICD-10-CM

## 2018-09-13 DIAGNOSIS — Z9114 Patient's other noncompliance with medication regimen: Secondary | ICD-10-CM

## 2018-09-13 DIAGNOSIS — R6889 Other general symptoms and signs: Secondary | ICD-10-CM | POA: Diagnosis not present

## 2018-09-13 DIAGNOSIS — G4734 Idiopathic sleep related nonobstructive alveolar hypoventilation: Secondary | ICD-10-CM | POA: Diagnosis not present

## 2018-09-13 NOTE — Progress Notes (Addendum)
SLEEP MEDICINE CLINIC   Provider:  Larey Seat, MD    Primary Care Physician:  Merrilee Seashore, MD   Referring Provider: Merrilee Seashore, MD    Chief Complaint  Patient presents with  . Follow-up    pt with son, rm 42. pt here for initial cpap follow up. patient finds that she falls asleep before putting the machine on and she will wake up and place it on as soon as she realizes. DME [facility]. states that when she does wear it she finds that she feels better with using it.     HPI:  Lindsay Ellis is a 42 y.o. female patient  , seen here as in a referral on 04-04-2018  from Dr. [name] for a sleep apnea work up. The patient is excessively daytime sleepy.  09-13-2018 , revisit with Lindsay Ellis bedroom, a 42 year old female patient who presented originally with excessive daytime sleepiness and was followed with a home sleep study on apnea link on 31 May 2018.  She had endorsed the Epworth sleepiness score of 15 out of 24 points, fatigue severity 56 out of 63 points BMI was 48, the home sleep study revealed severe sleep apnea with an AHI of 65.5/h RDI was even slightly higher and indicating loud snoring, she did have 62 minutes of oxygen desaturation more than 80% of the total sleep time recorded.  Heart rate varied between bradycardia and tachycardia.  Based on the responses I would have preferred to have an attended CPAP or BiPAP titration, this device tolerate this degree of apnea is usually not treatable with a dental device, her insurance denied an attended sleep study therefore we had only the options of doing a auto CPAP.  She has been 43% compliant for the last 30 days, AutoSet is encompassing a pressure window between 5 and 18 cmH2O with 3 cm EPR, residual AHI is positive at 2.6 apneas per hour no central apneas are emerging, 95th percentile pressure is 16 cmH2O. The patient reports that she is so fatigued she often falls asleep before she has put the CPAP on.  I would  very much for like for her to develop a ritual at night so that she at least uses CPAP for 4 hours, the reduced AHI is a very good outcome.       Lindsay Ellis presents today as a 41 year old African-American right-handed female currently wearing a hand brace wrist brace for the treatment of  RSD. She has struggled with diabetes and obesity for much of her adult life, and recently moved to [place] in October 2018.  She lived in [place] before for several years earlier she had undergone a sleep study and was diagnosed with obstructive sleep apnea, provided a CPAP machine but this got lost in the move.  Besides diabetes and morbid obesity she has problems with high cholesterol, joint pain aching muscles numbness dizziness and has been told that she snores.  She would like to resume CPAP treatment but will undergo with me to undergo a new sleep study for it.  Aside from her medical history I reviewed her medication list the patient is currently on a high dose of gabapentin she states she is taking she is taking 1500 mg 3 times a day a total of 4.5 g daily.  She is taking hydrochlorothiazide, meclizine, meloxicam as needed, Singulair as needed, baclofen 30 mg 3 times a day, Lipitor and Elavil.  Elavil is at 100 mg at bedtime.  It is supposed to  help her sleep 2.  Aside from Symbicort she has also a pro-air inhaler as needed.  She reports that she has today an elevated blood pressure but this is not unusual for her.  She was diagnosed with essential hypertension, she believes that her blood pressure is elevated but in pain.  She also carries a sickle cell trait not the disease.  She has not used insulin in the past for treatment of diabetes.  She reports frequent vertigo.  She relates her vertigo is also related to her blood pressure.  She denies any headaches associated with high blood pressures.   Sleep habits are as follows: She takes her evening medications at 8 Pm, and is asleep by 10 Pm, but has  nocturia 3-5 times every night, has been woken air hungry, and frequent vomiting at night.  The patient usually retreats to her bedroom before 10 PM and with the help of medication finds herself ready to sleep soon, she sleeps on 2 pillows on a flat mattress, the bedroom is described as cool, quiet and dark.  She is often so drowsy that she even falls asleep on the commode, has no trouble to get back to sleep after her frequent bathroom breaks.  More trouble some of the arousals from air hunger weakness, the feeling of choking or suffocations. She is panicked after these arousals 2-3 times a week.  She cannot recall dreaming. She has been told she snores thunderously, she sleeps prone, but turns each night on her back- and snores and has apnea. .  She rises at 6. 45 AM to get her 2 children ready for school. She has custody of her 78 year old granddaughter, too. She is tired, she does not feel refreshed or restored in the mornings.  She denies any morning headaches, and she has not been woken by headaches out of sleep.  She wakes with a parched, dry mouth.  She has been woken by palpitations, feeling clammy and diaphoretic, feeling panicked. She reports she takes naps in daytime as many as she can, as long as she can.  Often these are following the irresistible urge to sleep she did not schedule her naps or plan to nap.  A nap can take an hour.  She does not feel that power naps of less duration would refresher at all.  Sleep medical history and family sleep history: adopted. Was a sleep walker as a young adult and until recently.   Social history: 2 biological children and one grandchild in her custody, moved for [place] to [place] in 10/ 2018.  She currently smokes about 5 to 6 cigarettes a day, has been a smoker since 20 years ago.  She does not drink alcohol, she does use caffeinated beverages.  She drinks 1 cup of coffee a day no sodas, no iced tea no energy drinks.   Review of Systems: Out of a complete 14  system review, the patient complains of only the following symptoms, and all other reviewed systems are negative.  How likely are you to doze in the following situations: 0 = not likely, 1 = slight chance, 2 = moderate chance, 3 = high chance  Sitting and Reading? 3 Watching Television? 3 Sitting inactive in a public place (theater or meeting)?2 As a passenger in a car for an hour without a break? 0  Lying down in the afternoon when circumstances permit? 3 Sitting and talking to someone? 2 Sitting quietly after lunch without alcohol? 2 In a car, while  stopped for a few minutes in traffic? 0  Total = 15  She has no drivers licence.   Epworth score 15/ 24  , Fatigue severity score 56  , depression score 3/1 5   Social History   Socioeconomic History  . Marital status: Single    Spouse name: Not on file  . Number of children: Not on file  . Years of education: Not on file  . Highest education level: Not on file  Occupational History  . Not on file  Social Needs  . Financial resource strain: Not on file  . Food insecurity:    Worry: Not on file    Inability: Not on file  . Transportation needs:    Medical: Not on file    Non-medical: Not on file  Tobacco Use  . Smoking status: Current Every Day Smoker  . Smokeless tobacco: Never Used  Substance and Sexual Activity  . Alcohol use: Yes    Comment: occasionally  . Drug use: Never  . Sexual activity: Not on file  Lifestyle  . Physical activity:    Days per week: Not on file    Minutes per session: Not on file  . Stress: Not on file  Relationships  . Social connections:    Talks on phone: Not on file    Gets together: Not on file    Attends religious service: Not on file    Active member of club or organization: Not on file    Attends meetings of clubs or organizations: Not on file    Relationship status: Not on file  . Intimate partner violence:    Fear of current or ex partner: Not on file    Emotionally abused:  Not on file    Physically abused: Not on file    Forced sexual activity: Not on file  Other Topics Concern  . Not on file  Social History Narrative  . Not on file    Family History  Problem Relation Age of Onset  . Lung cancer Mother     Past Medical History:  Diagnosis Date  . Asthma   . Diabetes mellitus without complication ([place])   . Hyperlipidemia   . Hypertension   . Osteoarthritis   . Sickle cell trait ([place])   . Vertigo     Past Surgical History:  Procedure Laterality Date  . CESAREAN SECTION    . LUMBAR DISC SURGERY  2011    Current Outpatient Medications  Medication Sig Dispense Refill  . amitriptyline (ELAVIL) 100 MG tablet Take 100 mg by mouth at bedtime.    Marland Kitchen atorvastatin (LIPITOR) 10 MG tablet Take 10 mg by mouth daily.    . baclofen (LIORESAL) 20 MG tablet Take 30 mg by mouth 3 (three) times daily.    . Calcium Carbonate-Vit D-Min (CALCIUM/VITAMIN D/MINERALS) 600-200 MG-UNIT TABS Take by mouth.    . ferrous sulfate 325 (65 FE) MG tablet Take 325 mg by mouth daily with breakfast.    . gabapentin (NEURONTIN) 800 MG tablet Take 1,500 mg by mouth 3 (three) times daily.    . hydrochlorothiazide (HYDRODIURIL) 12.5 MG tablet Take 12.5 mg by mouth daily.    . meclizine (ANTIVERT) 25 MG tablet Take 25 mg by mouth 3 (three) times daily as needed for dizziness.    . meloxicam (MOBIC) 15 MG tablet Take 15 mg by mouth daily.    . metFORMIN (GLUCOPHAGE-XR) 500 MG 24 hr tablet 2 tablets 2 (two) times daily.    Marland Kitchen  montelukast (SINGULAIR) 10 MG tablet Take 10 mg by mouth at bedtime.    . Multiple Vitamin (MULTIVITAMIN) tablet Take 1 tablet by mouth daily.    Marland Kitchen PROAIR HFA 108 (90 Base) MCG/ACT inhaler INL 2 PFS PO Q 4 H PRN  6  . SYMBICORT 160-4.5 MCG/ACT inhaler INL 2 PFS PO BID  6   No current facility-administered medications for this visit.     Allergies as of 09/13/2018 - Review Complete 09/13/2018  Allergen Reaction Noted  . Morphine and related  03/01/2018     Vitals: BP (!) 153/104   Pulse 84   Ht 5\' 3"  (1.6 m)   Wt 274 lb (124.3 kg)   BMI 48.54 kg/m  Last Weight:  Wt Readings from Last 1 Encounters:  09/13/18 274 lb (124.3 kg)   KNL:ZJQB mass index is 48.54 kg/m.     Last Height:   Ht Readings from Last 1 Encounters:  09/13/18 5\' 3"  (1.6 m)    Physical exam:  General: The patient is awake, alert and appears not in acute distress. The patient is well groomed. Head: Normocephalic, atraumatic. Neck is supple. Mallampati 5  neck circumference:18. 5 - Nasal airflow . TMJ not  evident . Retrognathia is seen.  Cardiovascular:  Regular rate and rhythm , without  murmurs or carotid bruit, and without distended neck veins. Respiratory: Lungs are clear to auscultation. Skin:  Without evidence of edema, or rash Trunk: BMI is morbidly elevated.  Neurologic exam : The patient is awake and alert, oriented to place and time.  Speech is fluent,  without dysarthria, dysphonia or aphasia.  Mood and affect are appropriate.  Cranial nerves: Pupils are equal and briskly reactive to light.  Visual fields by finger perimetry are intact. Facial sensation intact to fine touch. Facial motor strength is symmetric and tongue and uvula move midline. Shoulder shrug was symmetrical.   Motor exam: Normal tone, muscle bulk and symmetric strength in all extremities. Sensory:  Fine touch, pinprick and vibration were tested in all extremities. Proprioception tested in the upper extremities was normal. Coordination: Rapid alternating movements in the fingers/hands were deferred - RSD - dr Vira Blanco.  Gait and station: Patient walks without assistive device , Deep tendon reflexes: in the  upper and lower extremities are symmetric and intact.     Assessment:  After physical and neurologic examination, review of laboratory studies,  Personal review of imaging studies, reports of other /same  Imaging studies, results of polysomnography and / or neurophysiology  testing and pre-existing records as far as provided in visit., my assessment is:   Severe OSA - and hypoxemia, on non-narcoioc pain mediation. what I would like for her to do is to see my nurse practitioner after another 30 days of compliant CPAP use if she then still maintains a very high degree of daytime sleepiness we may have to do additional testing.  Her Epworth score today was again 15 points but given the low compliance with CPAP I cannot state that the CPAP therapy has failed.   1) excessive daytime sleepiness,   attributed to untreated OSA, last sleep study at [facility]. Needs retesting by SPLIT study to regain another CPAP.   Her other risk factor for EDS is in her medications, the patient takes a muscle relaxant medication, try cyclic antidepressants which also work on muscle relaxation and tension pain, Neurontin which helps with his chronic pain but is also sedative- tramadol is d/c/.    The patient was advised  of the nature of the diagnosed disorder , the treatment options and the  risks for general health and wellness arising from not treating the condition.   I spent more than 15 minutes of face to face time with the patient.  Greater than 50% of time was spent in counseling and coordination of care. We have discussed the diagnosis and differential and I answered the patient's questions.    Plan:  Treatment plan and additional workup :    Needs to improve compliance, CPAP works, but needs to be used daily. what I would like for her to do is to see my nurse practitioner after another 30 days of compliant CPAP use if she then still maintains a very high degree of daytime sleepiness we may have to do additional testing.    Her Epworth score today was again 15 points but given the low compliance with CPAP I cannot state that the CPAP therapy has failed. Patient is not driving.  Rv with Np in 4-8 weeks for new compliance evaluation and Epworth.    Addendum 09-20-2018   "This  patient wants to restart CPAP compliantly but will not qualify for supplies based on poor compliance.  DME asked [place] to repeat sleep study, this time attended sleep study with titration. I ordered an attended sleep study, hope to get insurance approval.   Larey Seat, MD    Larey Seat, MD 11/94/1740, 8:14 PM  Certified in Neurology by ABPN Certified in Sleep Medicine by [facility] [address], [place], [place] [address]

## 2018-09-13 NOTE — Patient Instructions (Signed)
CPAP Hypersomnia Hypersomnia is when you feel extremely tired during the day even though you're getting plenty of sleep at night. You may need to take naps during the day, and you may also be extremely difficult to wake up when you are sleeping. What are the causes? The cause of your hypersomnia may not be known. Hypersomnia may be caused by:  Medicines.  Sleep disorders, such as narcolepsy.  Trauma or injury to your head or nervous system.  Using drugs or alcohol.  Tumors.  Medical conditions, such as depression or hypothyroidism.  Genetics.  What are the signs or symptoms? The main symptoms of hypersomnia include:  Feeling extremely tired throughout the day.  Being very difficult to wake up.  Sleeping for longer and longer periods.  Taking naps throughout the day.  Other symptoms may include:  Feeling: ? Restless. ? Annoyed. ? Anxious. ? Low energy.  Having difficulty: ? Remembering. ? Speaking. ? Thinking.  Losing your appetite.  Experiencing hallucinations.  How is this diagnosed? Hypersomnia may be diagnosed by:  Medical history and physical exam. This will include a sleep history.  Completing sleep logs.  Tests may also be done, such as: ? Polysomnography. ? Multiple sleep latency test (MSLT).  How is this treated? There is no cure for hypersomnia, but treatment can be very effective in helping manage the condition. Treatment may include:  Lifestyle and sleeping strategies to help cope with the condition.  Stimulant medicines.  Treating any underlying causes of hypersomnia.  Follow these instructions at home:  Take medicines only as directed by your health care provider.  Schedule short naps for when you feel sleepiest during the day. Tell your employer or teachers that you have hypersomnia. You may be able to adjust your schedule to include time for naps.  Avoid drinking alcohol or caffeinated beverages.  Do not eat a heavy meal before  bedtime. Eat at about the same times every day.  Do not drive or operate heavy machinery if you are sleepy.  Do not swim or go out on the water without a life jacket.  If possible, adjust your schedule so that you do not have to work or be active at night.  Keep all follow-up visits as directed by your health care provider. This is important. Contact a health care provider if:  You have new symptoms.  Your symptoms get worse. Get help right away if: You have serious thoughts of hurting yourself or someone else. This information is not intended to replace advice given to you by your health care provider. Make sure you discuss any questions you have with your health care provider. Document Released: 10/08/2002 Document Revised: 03/25/2016 Document Reviewed: 05/23/2014 Elsevier Interactive Patient Education  2018 [employer].  CPAP and BiPAP Information CPAP and BiPAP are methods of helping a person breathe with the use of air pressure. CPAP stands for "continuous positive airway pressure." BiPAP stands for "bi-level positive airway pressure." In both methods, air is blown through your nose or mouth and into your air passages to help you breathe well. CPAP and BiPAP use different amounts of pressure to blow air. With CPAP, the amount of pressure stays the same while you breathe in and out. With BiPAP, the amount of pressure is increased when you breathe in (inhale) so that you can take larger breaths. Your health care provider will recommend whether CPAP or BiPAP would be more helpful for you. Why are CPAP and BiPAP treatments used? CPAP or BiPAP can be  helpful if you have:  Sleep apnea.  Chronic obstructive pulmonary disease (COPD).  Heart failure.  Medical conditions that weaken the muscles of the chest including muscular dystrophy, or neurological diseases such as amyotrophic lateral sclerosis (ALS).  Other problems that cause breathing to be weak, abnormal, or difficult.  CPAP  is most commonly used for obstructive sleep apnea (OSA) to keep the airways from collapsing when the muscles relax during sleep. How is CPAP or BiPAP administered? Both CPAP and BiPAP are provided by a small machine with a flexible plastic tube that attaches to a plastic mask. You wear the mask. Air is blown through the mask into your nose or mouth. The amount of pressure that is used to blow the air can be adjusted on the machine. Your health care provider will determine the pressure setting that should be used based on your individual needs. When should CPAP or BiPAP be used? In most cases, the mask only needs to be worn during sleep. Generally, the mask needs to be worn throughout the night and during any daytime naps. People with certain medical conditions may also need to wear the mask at other times when they are awake. Follow instructions from your health care provider about when to use the machine. What are some tips for using the mask?  Because the mask needs to be snug, some people feel trapped or closed-in (claustrophobic) when first using the mask. If you feel this way, you may need to get used to the mask. One way to do this is by holding the mask loosely over your nose or mouth and then gradually applying the mask more snugly. You can also gradually increase the amount of time that you use the mask.  Masks are available in various types and sizes. Some fit over your mouth and nose while others fit over just your nose. If your mask does not fit well, talk with your health care provider about getting a different one.  If you are using a mask that fits over your nose and you tend to breathe through your mouth, a chin strap may be applied to help keep your mouth closed.  The CPAP and BiPAP machines have alarms that may sound if the mask comes off or develops a leak.  If you have trouble with the mask, it is very important that you talk with your health care provider about finding a way to  make the mask easier to tolerate. Do not stop using the mask. Stopping the use of the mask could have a negative impact on your health. What are some tips for using the machine?  Place your CPAP or BiPAP machine on a secure table or stand near an electrical outlet.  Know where the on/off switch is located on the machine.  Follow instructions from your health care provider about how to set the pressure on your machine and when you should use it.  Do not eat or drink while the CPAP or BiPAP machine is on. Food or fluids could get pushed into your lungs by the pressure of the CPAP or BiPAP.  Do not smoke. Tobacco smoke residue can damage the machine.  For home use, CPAP and BiPAP machines can be rented or purchased through home health care companies. Many different brands of machines are available. Renting a machine before purchasing may help you find out which particular machine works well for you.  Keep the CPAP or BiPAP machine and attachments clean. Ask your health care provider  for specific instructions. Get help right away if:  You have redness or open areas around your nose or mouth where the mask fits.  You have trouble using the CPAP or BiPAP machine.  You cannot tolerate wearing the CPAP or BiPAP mask.  You have pain, discomfort, and bloating in your abdomen. Summary  CPAP and BiPAP are methods of helping a person breathe with the use of air pressure.  Both CPAP and BiPAP are provided by a small machine with a flexible plastic tube that attaches to a plastic mask.  If you have trouble with the mask, it is very important that you talk with your health care provider about finding a way to make the mask easier to tolerate. This information is not intended to replace advice given to you by your health care provider. Make sure you discuss any questions you have with your health care provider. Document Released: 07/16/2004 Document Revised: 09/06/2016 Document Reviewed:  09/06/2016 Elsevier Interactive Patient Education  2017 [employer].

## 2018-09-13 NOTE — Addendum Note (Signed)
Addended by: Larey Seat on: 09/13/2018 03:38 PM   Modules accepted: Orders

## 2018-09-19 ENCOUNTER — Telehealth: Payer: Self-pay | Admitting: Neurology

## 2018-09-19 DIAGNOSIS — R0683 Snoring: Secondary | ICD-10-CM

## 2018-09-19 DIAGNOSIS — G4733 Obstructive sleep apnea (adult) (pediatric): Secondary | ICD-10-CM

## 2018-09-19 DIAGNOSIS — Z9189 Other specified personal risk factors, not elsewhere classified: Secondary | ICD-10-CM

## 2018-09-19 DIAGNOSIS — G4734 Idiopathic sleep related nonobstructive alveolar hypoventilation: Secondary | ICD-10-CM

## 2018-09-19 DIAGNOSIS — G4719 Other hypersomnia: Secondary | ICD-10-CM

## 2018-09-19 DIAGNOSIS — F19982 Other psychoactive substance use, unspecified with psychoactive substance-induced sleep disorder: Secondary | ICD-10-CM

## 2018-09-19 NOTE — Telephone Encounter (Signed)
Received a notification from [place] stating the patient will have to repeat OV and sleep studiy in lab to restart the process for the machine and for medicare to pay for machine since she was non compliant.   Dr Dohmeier can you addend office note from 09/13/18 stating repeating the sleep study to requalify the patient for CPAP per medicare guidelines.  Order placed for split night for the patient. Once the patient is scheduled [facility] is asking that we notify them.

## 2018-09-20 ENCOUNTER — Other Ambulatory Visit: Payer: Self-pay | Admitting: Neurology

## 2018-09-20 ENCOUNTER — Telehealth: Payer: Self-pay

## 2018-09-20 DIAGNOSIS — F19982 Other psychoactive substance use, unspecified with psychoactive substance-induced sleep disorder: Secondary | ICD-10-CM

## 2018-09-20 DIAGNOSIS — G4719 Other hypersomnia: Secondary | ICD-10-CM

## 2018-09-20 DIAGNOSIS — R0683 Snoring: Secondary | ICD-10-CM

## 2018-09-20 DIAGNOSIS — G4733 Obstructive sleep apnea (adult) (pediatric): Secondary | ICD-10-CM

## 2018-09-20 NOTE — Telephone Encounter (Signed)
Addendum written, 09-13-2018 , Larey Seat, MD

## 2018-09-20 NOTE — Telephone Encounter (Signed)
Called the patient just to make sure she was aware that insurance is requiring we restart that process since she was considered noncompliant at the first inial office visit with using the CPAP. Advised the patient that we will use the novemeber visit as the office visit and will mention that we have to repeat sleep study for insurance purposes. Advised the patient that we were being told by [facility] that in lab study was required however the insurance is denying that at this time. Informed her that we are working on that for her and will get her scheduled for which study insurance approves. informed her she would complete the study again and then at that time is restarts the 31-90 day mark of starting the machine. Informed the patient that jan 7 th  Would need to be pushed out to allow time to complete the process. Informed her we would reschedule once she has completed the sleep study again. Pt verbalized understanding.

## 2018-09-20 NOTE — Addendum Note (Signed)
Addended by: Larey Seat on: 09/20/2018 05:31 PM   Modules accepted: Orders

## 2018-09-20 NOTE — Telephone Encounter (Signed)
Can we get in lab.

## 2018-09-20 NOTE — Telephone Encounter (Signed)
[employer] Denied in lab sleep study, need HST order

## 2018-09-20 NOTE — Telephone Encounter (Signed)
Reached out to [facility] and verifying if this would be accepted because when they informed us of having to repeat the testing they informed us that it would have to be in lab not HST. Will wait to hear back from [facility]

## 2018-09-26 DIAGNOSIS — G4733 Obstructive sleep apnea (adult) (pediatric): Secondary | ICD-10-CM | POA: Diagnosis not present

## 2018-09-30 DIAGNOSIS — J4520 Mild intermittent asthma, uncomplicated: Secondary | ICD-10-CM | POA: Diagnosis not present

## 2018-10-16 ENCOUNTER — Ambulatory Visit (INDEPENDENT_AMBULATORY_CARE_PROVIDER_SITE_OTHER): Payer: Medicare HMO | Admitting: Neurology

## 2018-10-16 DIAGNOSIS — F19982 Other psychoactive substance use, unspecified with psychoactive substance-induced sleep disorder: Secondary | ICD-10-CM

## 2018-10-16 DIAGNOSIS — G4733 Obstructive sleep apnea (adult) (pediatric): Secondary | ICD-10-CM

## 2018-10-16 DIAGNOSIS — R6889 Other general symptoms and signs: Secondary | ICD-10-CM | POA: Diagnosis not present

## 2018-10-16 DIAGNOSIS — G4719 Other hypersomnia: Secondary | ICD-10-CM

## 2018-10-16 DIAGNOSIS — R0683 Snoring: Secondary | ICD-10-CM

## 2018-10-16 DIAGNOSIS — I495 Sick sinus syndrome: Secondary | ICD-10-CM

## 2018-10-17 DIAGNOSIS — R6889 Other general symptoms and signs: Secondary | ICD-10-CM | POA: Diagnosis not present

## 2018-10-19 ENCOUNTER — Encounter (INDEPENDENT_AMBULATORY_CARE_PROVIDER_SITE_OTHER): Payer: Medicare HMO

## 2018-10-20 DIAGNOSIS — I495 Sick sinus syndrome: Secondary | ICD-10-CM | POA: Insufficient documentation

## 2018-10-20 NOTE — Addendum Note (Signed)
Addended by: Larey Seat on: 10/20/2018 12:07 PM   Modules accepted: Orders

## 2018-10-20 NOTE — Procedures (Signed)
[place], [place] [address] NAME:   Lindsay Ellis                                                            DOB: [birth year] MEDICAL RECORD No:    [id number]                                    DOS:  10/16/2018 REFERRING PHYSICIAN: Merrilee Seashore, MD STUDY PERFORMED: Home Sleep Test on Gweneth Dimitri 09-13-2018 Revisit with [name], a 42 year old female patient, who presented originally with excessive daytime sleepiness and was followed with a home sleep study on apnea link on 31 May 2018.  She had endorsed the Epworth sleepiness score of 15 out of 24 points, fatigue severity 56 out of 63 points; BMI was 48. The home sleep study revealed severe sleep apnea with an AHI of 65.5/h RDI was even slightly higher and indicating loud snoring, she did have 62 minutes of oxygen desaturation more than 80% of the total sleep time recorded.  Heart rate varied between bradycardia and tachycardia. Based on the responses, I would have preferred to have an attended CPAP or BiPAP titration, this degree of apnea is usually not treatable with a dental device, but her insurance denied an attended sleep study therefore we had only the options of ordering an auto CPAP.  She has been only 43% compliant for the last 30 days, AutoSet is encompassing a pressure window between 5 and 18 cmH2O with 3 cm EPR, residual AHI is positive at 2.6 apneas per hour no central apneas are emerging, 95th percentile pressure is 16 cmH2O. The patient reports that she is so fatigued she often falls asleep before she has put the CPAP on.  I would very much for like for her to develop a routine at night so that she at least uses CPAP compliantly for 4 hours, the reduced AHI is encouraging. Epworth Sleepiness Score endorsed at 15/24 points. BMI 48kg/m2   STUDY RESULTS:   Total Recording Time: 10 h 38 min: Valid Sleep Time: 6 h 29 m. Total Apnea/Hypopnea Index (AHI): 65.9 /h; RDI: 66.1 /h; REM AHI: 50.1 /h Average Oxygen  Saturation:  95 %: Lowest Oxygen Saturation: 87 %  Total Time in Oxygen Saturation below 89 %: 0.2 minutes  Average Heart Rate:  82 bpm (between 37 and 180 bpm) - tachy-bradycardia.  IMPRESSION: Severe Sleep apnea is confirmed at AHI of 65.9/h, and not REM sleep associated. The heart rate is regular but tachy-bradycardia is noted. RECOMMENDATION: I will urge the patient to use the CPAP machine, which has been successfully reducing her AHI. Once she has done so 60-90 days, we may have to look at other reasons for excessive daytime sleepiness should the high Epworth score persist.  I certify that I have reviewed the raw data recording prior to the issuance of this report in accordance with the standards of the American Academy of Sleep Medicine (AASM). Larey Seat, M.D.  10-20-2018      Medical Director of [facility] at [facility], accredited by the AASM. Diplomat of the ABPN and ABSM.

## 2018-10-23 ENCOUNTER — Telehealth: Payer: Self-pay | Admitting: Neurology

## 2018-10-23 NOTE — Telephone Encounter (Signed)
-----   Message from Larey Seat, MD sent at 10/20/2018 12:07 PM EST ----- Cc Dr [name],  IMPRESSION: Severe Sleep apnea is confirmed at AHI of 65.9/h, and the apnea degree is not REM sleep associated ( more likely a central apnea or cheyne stokes respiration) . The heart rate is regular, but tachy-bradycardia is noted. RECOMMENDATION: I will urge the patient to use the CPAP machine, which has been successfully reducing her AHI. Once she has done so 60-90 days, we may have to look at other reasons for excessive daytime sleepiness should the high Epworth score persist.  I certify that I have reviewed the raw data recording prior to the issuance of this report in accordance with the standards of the American Academy of Sleep Medicine (AASM). Larey Seat, M.D.  10-20-2018

## 2018-10-23 NOTE — Telephone Encounter (Signed)
Called patient to discuss sleep study results. No answer at this time. LVM for the patient to call back.   

## 2018-10-26 DIAGNOSIS — G4733 Obstructive sleep apnea (adult) (pediatric): Secondary | ICD-10-CM | POA: Diagnosis not present

## 2018-10-26 NOTE — Telephone Encounter (Signed)
Called the patient back and advised the SSR. Advised this was all repeated for insurance purposes starting that process over. Patient currently still has the CPAP machine. Will send order to [facility] advising that sleep study has been completed and make sure the settings are where they are needed. Encouraged the patient to make sure to use the CPAP compliantly over 4 hrs each night and scheduled the patient for a follow up office visit March 10,2020 at 12:45 pm. Pt verbalized understanding. Pt had no questions at this time but was encouraged to call back if questions arise.

## 2018-10-26 NOTE — Telephone Encounter (Signed)
Made 2nd attempt to contact the patient. No answer. LVM

## 2018-10-26 NOTE — Telephone Encounter (Signed)
Pt returned Rn's call °

## 2018-10-30 DIAGNOSIS — J4520 Mild intermittent asthma, uncomplicated: Secondary | ICD-10-CM | POA: Diagnosis not present

## 2018-11-07 ENCOUNTER — Ambulatory Visit: Payer: Medicare HMO | Admitting: Neurology

## 2018-11-13 DIAGNOSIS — G4733 Obstructive sleep apnea (adult) (pediatric): Secondary | ICD-10-CM | POA: Diagnosis not present

## 2018-11-26 DIAGNOSIS — G4733 Obstructive sleep apnea (adult) (pediatric): Secondary | ICD-10-CM | POA: Diagnosis not present

## 2018-11-30 DIAGNOSIS — J4520 Mild intermittent asthma, uncomplicated: Secondary | ICD-10-CM | POA: Diagnosis not present

## 2018-12-27 DIAGNOSIS — G4733 Obstructive sleep apnea (adult) (pediatric): Secondary | ICD-10-CM | POA: Diagnosis not present

## 2018-12-30 DIAGNOSIS — J4520 Mild intermittent asthma, uncomplicated: Secondary | ICD-10-CM | POA: Diagnosis not present

## 2019-01-08 NOTE — Progress Notes (Deleted)
[facility]  PATIENT: Lindsay Ellis DOB: [birth year]   REASON FOR VISIT: Follow-up for severe obstructive sleep apnea confirmed by sleep test 10/06/2018 here for initial CPAP HISTORY FROM:    HISTORY OF PRESENT ILLNESS: 11/13/19CDNatalie Ellis is a 43 y.o. female patient  , seen here as in a referral on 04-04-2018  from Dr. [name] for a sleep apnea work up. The patient is excessively daytime sleepy.  09-13-2018 , revisit with [name] bedroom, a 43 year old female patient who presented originally with excessive daytime sleepiness and was followed with a home sleep study on apnea link on 31 May 2018.  She had endorsed the Epworth sleepiness score of 15 out of 24 points, fatigue severity 56 out of 63 points BMI was 48, the home sleep study revealed severe sleep apnea with an AHI of 65.5/h RDI was even slightly higher and indicating loud snoring, she did have 62 minutes of oxygen desaturation more than 80% of the total sleep time recorded.  Heart rate varied between bradycardia and tachycardia.  Based on the responses I would have preferred to have an attended CPAP or BiPAP titration, this device tolerate this degree of apnea is usually not treatable with a dental device, her insurance denied an attended sleep study therefore we had only the options of doing a auto CPAP.  She has been 43% compliant for the last 30 days, AutoSet is encompassing a pressure window between 5 and 18 cmH2O with 3 cm EPR, residual AHI is positive at 2.6 apneas per hour no central apneas are emerging, 95th percentile pressure is 16 cmH2O. The patient reports that she is so fatigued she often falls asleep before she has put the CPAP on.  I would very much for like for her to develop a ritual at night so that she at least uses CPAP for 4 hours, the reduced AHI is a very good outcome.      REVIEW OF SYSTEMS: Full 14 system review of systems performed and notable only for those listed, all others  are neg:  Constitutional: neg  Cardiovascular: neg Ear/Nose/Throat: neg  Skin: neg Eyes: neg Respiratory: neg Gastroitestinal: neg  Hematology/Lymphatic: neg  Endocrine: neg Musculoskeletal:neg Allergy/Immunology: neg Neurological: neg Psychiatric: neg Sleep : neg   ALLERGIES: Allergies  Allergen Reactions  . Morphine And Related     HOME MEDICATIONS: Outpatient Medications Prior to Visit  Medication Sig Dispense Refill  . amitriptyline (ELAVIL) 100 MG tablet Take 100 mg by mouth at bedtime.    Marland Kitchen atorvastatin (LIPITOR) 10 MG tablet Take 10 mg by mouth daily.    . baclofen (LIORESAL) 20 MG tablet Take 30 mg by mouth 3 (three) times daily.    . Calcium Carbonate-Vit D-Min (CALCIUM/VITAMIN D/MINERALS) 600-200 MG-UNIT TABS Take by mouth.    . ferrous sulfate 325 (65 FE) MG tablet Take 325 mg by mouth daily with breakfast.    . gabapentin (NEURONTIN) 800 MG tablet Take 1,500 mg by mouth 3 (three) times daily.    . hydrochlorothiazide (HYDRODIURIL) 12.5 MG tablet Take 12.5 mg by mouth daily.    . meclizine (ANTIVERT) 25 MG tablet Take 25 mg by mouth 3 (three) times daily as needed for dizziness.    . meloxicam (MOBIC) 15 MG tablet Take 15 mg by mouth daily.    . metFORMIN (GLUCOPHAGE-XR) 500 MG 24 hr tablet 2 tablets 2 (two) times daily.    . montelukast (SINGULAIR) 10 MG tablet Take 10 mg by mouth at bedtime.    Marland Kitchen  Multiple Vitamin (MULTIVITAMIN) tablet Take 1 tablet by mouth daily.    Marland Kitchen PROAIR HFA 108 (90 Base) MCG/ACT inhaler INL 2 PFS PO Q 4 H PRN  6  . SYMBICORT 160-4.5 MCG/ACT inhaler INL 2 PFS PO BID  6   No facility-administered medications prior to visit.     PAST MEDICAL HISTORY: Past Medical History:  Diagnosis Date  . Asthma   . Diabetes mellitus without complication ([place])   . Hyperlipidemia   . Hypertension   . Osteoarthritis   . Sickle cell trait ([place])   . Vertigo     PAST SURGICAL HISTORY: Past Surgical History:  Procedure Laterality Date  . CESAREAN  SECTION    . LUMBAR DISC SURGERY  2011    FAMILY HISTORY: Family History  Problem Relation Age of Onset  . Lung cancer Mother     SOCIAL HISTORY: Social History   Socioeconomic History  . Marital status: Single    Spouse name: Not on file  . Number of children: Not on file  . Years of education: Not on file  . Highest education level: Not on file  Occupational History  . Not on file  Social Needs  . Financial resource strain: Not on file  . Food insecurity:    Worry: Not on file    Inability: Not on file  . Transportation needs:    Medical: Not on file    Non-medical: Not on file  Tobacco Use  . Smoking status: Current Every Day Smoker  . Smokeless tobacco: Never Used  Substance and Sexual Activity  . Alcohol use: Yes    Comment: occasionally  . Drug use: Never  . Sexual activity: Not on file  Lifestyle  . Physical activity:    Days per week: Not on file    Minutes per session: Not on file  . Stress: Not on file  Relationships  . Social connections:    Talks on phone: Not on file    Gets together: Not on file    Attends religious service: Not on file    Active member of club or organization: Not on file    Attends meetings of clubs or organizations: Not on file    Relationship status: Not on file  . Intimate partner violence:    Fear of current or ex partner: Not on file    Emotionally abused: Not on file    Physically abused: Not on file    Forced sexual activity: Not on file  Other Topics Concern  . Not on file  Social History Narrative  . Not on file     PHYSICAL EXAM  There were no vitals filed for this visit. There is no height or weight on file to calculate BMI.  Generalized: Well developed, in no acute distress  Head: normocephalic and atraumatic,. Oropharynx benign  Neck: Supple, no carotid bruits  Cardiac: Regular rate rhythm, no murmur  Musculoskeletal: No deformity   Neurological examination   Mentation: Alert oriented to time,  place, history taking. Attention span and concentration appropriate. Recent and remote memory intact.  Follows all commands speech and language fluent.   Cranial nerve II-XII: Fundoscopic exam reveals sharp disc margins.Pupils were equal round reactive to light extraocular movements were full, visual field were full on confrontational test. Facial sensation and strength were normal. hearing was intact to finger rubbing bilaterally. Uvula tongue midline. head turning and shoulder shrug were normal and symmetric.Tongue protrusion into cheek strength was normal. Motor: normal bulk  and tone, full strength in the BUE, BLE, fine finger movements normal, no pronator drift. No focal weakness Sensory: normal and symmetric to light touch, pinprick, and  Vibration, proprioception  Coordination: finger-nose-finger, heel-to-shin bilaterally, no dysmetria Reflexes: Brachioradialis 2/2, biceps 2/2, triceps 2/2, patellar 2/2, Achilles 2/2, plantar responses were flexor bilaterally. Gait and Station: Rising up from seated position without assistance, normal stance,  moderate stride, good arm swing, smooth turning, able to perform tiptoe, and heel walking without difficulty. Tandem gait is steady  DIAGNOSTIC DATA (LABS, IMAGING, TESTING) - I reviewed patient records, labs, notes, testing and imaging myself where available.  No results found for: WBC, HGB, HCT, MCV, PLT No results found for: NA, K, CL, CO2, GLUCOSE, BUN, CREATININE, CALCIUM, PROT, ALBUMIN, AST, ALT, ALKPHOS, BILITOT, GFRNONAA, GFRAA No results found for: CHOL, HDL, LDLCALC, LDLDIRECT, TRIG, CHOLHDL No results found for: HGBA1C No results found for: VITAMINB12 No results found for: TSH  ***  ASSESSMENT AND PLAN  43 y.o. year old female  has a past medical history of Asthma, Diabetes mellitus without complication ([place]), Hyperlipidemia, Hypertension, Osteoarthritis, Sickle cell trait ([place]), and Vertigo. here with ***    Rayburn Ma, [facility], APRN  [facility] [address] [place], [place] [address] [phone]

## 2019-01-09 ENCOUNTER — Ambulatory Visit: Payer: Self-pay | Admitting: Nurse Practitioner

## 2019-01-24 ENCOUNTER — Telehealth: Payer: Self-pay

## 2019-01-24 NOTE — Telephone Encounter (Signed)
Unable to get in contact with the patient due to the number on file not working.

## 2019-01-25 ENCOUNTER — Other Ambulatory Visit: Payer: Self-pay

## 2019-01-25 ENCOUNTER — Ambulatory Visit (INDEPENDENT_AMBULATORY_CARE_PROVIDER_SITE_OTHER): Payer: Medicare HMO | Admitting: Nurse Practitioner

## 2019-01-25 ENCOUNTER — Encounter: Payer: Self-pay | Admitting: Nurse Practitioner

## 2019-01-25 DIAGNOSIS — Z9989 Dependence on other enabling machines and devices: Secondary | ICD-10-CM | POA: Diagnosis not present

## 2019-01-25 DIAGNOSIS — G4733 Obstructive sleep apnea (adult) (pediatric): Secondary | ICD-10-CM | POA: Diagnosis not present

## 2019-01-25 DIAGNOSIS — R6889 Other general symptoms and signs: Secondary | ICD-10-CM | POA: Diagnosis not present

## 2019-01-25 NOTE — Progress Notes (Signed)
Virtual Visit via Telephone Note  I connected with Lindsay Ellis on 01/25/19 at  1:15 PM EDT by telephone and verified that I am speaking with the correct person using two identifiers.   I discussed the limitations, risks, security and privacy concerns of performing an evaluation and management service by telephone and the availability of in person appointments. I also discussed with the patient that there may be a patient responsible charge related to this service. The patient expressed understanding and agreed to proceed.  Patient reached at home, provider is in the office.   History of Present Illness: [name], 43 year old female is called for telephone virtual visit with recently diagnosed severe obstructive sleep apnea AHI of 65.9 per sleep study done on 10/16/2018.Marland Kitchen  She tells me that she has a leak with her mask however otherwise she has tolerated CPAP well and she is trying to be compliant.  CPAP data dated 12/25/2018-01/23/2019 shows compliance greater than 4 hours at 77%.  Average usage 5 hours 13 minutes.  Set pressure 5 to 18 cm EPR level 3.  Leak 95th percentile 27.6.  AHI 2.7 she feels refreshed when she wakes up in the morning and she no longer has daytime drowsiness.    Observations/Objective: Alert answers questions appropriately.   Assessment and Plan: Recently diagnosed severe obstructive sleep apnea by sleep study on 10/16/2018.CPAP data dated 12/25/2018-01/23/2019 shows compliance greater than 4 hours at 77%.  Average usage 5 hours 13 minutes.  Set pressure 5 to 18 cm EPR level 3.  Leak 95th percentile 27.6.  AHI 2.7 we will ask for mask refit due to leak from [facility] her DME provider.   Follow Up Instructions: She will follow-up in 4 months for repeat compliance.    I discussed the assessment and treatment plan with the patient. The patient was provided an opportunity to ask questions and all were answered. The patient agreed with the plan and demonstrated an understanding of  the instructions.   The patient was advised to call back or seek an in-person evaluation if the symptoms worsen or if the condition fails to improve as anticipated.  I provided 15 minutes of non-face-to-face time during this encounter.   Dennie Bible, NP

## 2019-01-29 DIAGNOSIS — J4520 Mild intermittent asthma, uncomplicated: Secondary | ICD-10-CM | POA: Diagnosis not present

## 2019-02-25 DIAGNOSIS — G4733 Obstructive sleep apnea (adult) (pediatric): Secondary | ICD-10-CM | POA: Diagnosis not present

## 2019-03-01 DIAGNOSIS — J4520 Mild intermittent asthma, uncomplicated: Secondary | ICD-10-CM | POA: Diagnosis not present

## 2019-03-07 DIAGNOSIS — M79675 Pain in left toe(s): Secondary | ICD-10-CM | POA: Diagnosis not present

## 2019-03-07 DIAGNOSIS — I10 Essential (primary) hypertension: Secondary | ICD-10-CM | POA: Diagnosis not present

## 2019-03-07 DIAGNOSIS — M79672 Pain in left foot: Secondary | ICD-10-CM | POA: Diagnosis not present

## 2019-03-07 DIAGNOSIS — S99922A Unspecified injury of left foot, initial encounter: Secondary | ICD-10-CM | POA: Diagnosis not present

## 2019-03-07 DIAGNOSIS — R6889 Other general symptoms and signs: Secondary | ICD-10-CM | POA: Diagnosis not present

## 2019-03-07 DIAGNOSIS — Z7189 Other specified counseling: Secondary | ICD-10-CM | POA: Diagnosis not present

## 2019-03-07 DIAGNOSIS — E782 Mixed hyperlipidemia: Secondary | ICD-10-CM | POA: Diagnosis not present

## 2019-03-07 DIAGNOSIS — E1165 Type 2 diabetes mellitus with hyperglycemia: Secondary | ICD-10-CM | POA: Diagnosis not present

## 2019-03-27 DIAGNOSIS — G4733 Obstructive sleep apnea (adult) (pediatric): Secondary | ICD-10-CM | POA: Diagnosis not present

## 2019-03-31 DIAGNOSIS — J4520 Mild intermittent asthma, uncomplicated: Secondary | ICD-10-CM | POA: Diagnosis not present

## 2019-04-27 DIAGNOSIS — G4733 Obstructive sleep apnea (adult) (pediatric): Secondary | ICD-10-CM | POA: Diagnosis not present

## 2019-05-01 DIAGNOSIS — J4520 Mild intermittent asthma, uncomplicated: Secondary | ICD-10-CM | POA: Diagnosis not present

## 2019-05-03 DIAGNOSIS — E1165 Type 2 diabetes mellitus with hyperglycemia: Secondary | ICD-10-CM | POA: Diagnosis not present

## 2019-05-03 DIAGNOSIS — I10 Essential (primary) hypertension: Secondary | ICD-10-CM | POA: Diagnosis not present

## 2019-05-03 DIAGNOSIS — R6889 Other general symptoms and signs: Secondary | ICD-10-CM | POA: Diagnosis not present

## 2019-05-03 DIAGNOSIS — Z0000 Encounter for general adult medical examination without abnormal findings: Secondary | ICD-10-CM | POA: Diagnosis not present

## 2019-05-03 DIAGNOSIS — E782 Mixed hyperlipidemia: Secondary | ICD-10-CM | POA: Diagnosis not present

## 2019-05-03 DIAGNOSIS — Z7189 Other specified counseling: Secondary | ICD-10-CM | POA: Diagnosis not present

## 2019-05-09 DIAGNOSIS — E782 Mixed hyperlipidemia: Secondary | ICD-10-CM | POA: Diagnosis not present

## 2019-05-09 DIAGNOSIS — R6889 Other general symptoms and signs: Secondary | ICD-10-CM | POA: Diagnosis not present

## 2019-05-09 DIAGNOSIS — G9050 Complex regional pain syndrome I, unspecified: Secondary | ICD-10-CM | POA: Diagnosis not present

## 2019-05-09 DIAGNOSIS — M79675 Pain in left toe(s): Secondary | ICD-10-CM | POA: Diagnosis not present

## 2019-05-09 DIAGNOSIS — Z6841 Body Mass Index (BMI) 40.0 and over, adult: Secondary | ICD-10-CM | POA: Diagnosis not present

## 2019-05-09 DIAGNOSIS — I10 Essential (primary) hypertension: Secondary | ICD-10-CM | POA: Diagnosis not present

## 2019-05-09 DIAGNOSIS — E1165 Type 2 diabetes mellitus with hyperglycemia: Secondary | ICD-10-CM | POA: Diagnosis not present

## 2019-05-09 DIAGNOSIS — J4520 Mild intermittent asthma, uncomplicated: Secondary | ICD-10-CM | POA: Diagnosis not present

## 2019-05-09 DIAGNOSIS — D573 Sickle-cell trait: Secondary | ICD-10-CM | POA: Diagnosis not present

## 2019-05-09 DIAGNOSIS — Z0000 Encounter for general adult medical examination without abnormal findings: Secondary | ICD-10-CM | POA: Diagnosis not present

## 2019-05-21 ENCOUNTER — Ambulatory Visit (INDEPENDENT_AMBULATORY_CARE_PROVIDER_SITE_OTHER): Payer: Medicare HMO | Admitting: Podiatry

## 2019-05-21 ENCOUNTER — Other Ambulatory Visit: Payer: Self-pay

## 2019-05-21 ENCOUNTER — Encounter: Payer: Self-pay | Admitting: Podiatry

## 2019-05-21 ENCOUNTER — Other Ambulatory Visit: Payer: Self-pay | Admitting: Podiatry

## 2019-05-21 ENCOUNTER — Ambulatory Visit (INDEPENDENT_AMBULATORY_CARE_PROVIDER_SITE_OTHER): Payer: Medicare HMO

## 2019-05-21 VITALS — BP 130/87 | HR 88 | Temp 97.9°F | Resp 16

## 2019-05-21 DIAGNOSIS — S90122A Contusion of left lesser toe(s) without damage to nail, initial encounter: Secondary | ICD-10-CM | POA: Diagnosis not present

## 2019-05-21 DIAGNOSIS — Q828 Other specified congenital malformations of skin: Secondary | ICD-10-CM

## 2019-05-21 DIAGNOSIS — E1149 Type 2 diabetes mellitus with other diabetic neurological complication: Secondary | ICD-10-CM

## 2019-05-21 DIAGNOSIS — M779 Enthesopathy, unspecified: Secondary | ICD-10-CM | POA: Diagnosis not present

## 2019-05-21 DIAGNOSIS — S90129A Contusion of unspecified lesser toe(s) without damage to nail, initial encounter: Secondary | ICD-10-CM

## 2019-05-21 DIAGNOSIS — S99922A Unspecified injury of left foot, initial encounter: Secondary | ICD-10-CM

## 2019-05-21 DIAGNOSIS — B351 Tinea unguium: Secondary | ICD-10-CM | POA: Diagnosis not present

## 2019-05-21 DIAGNOSIS — M79671 Pain in right foot: Secondary | ICD-10-CM

## 2019-05-21 DIAGNOSIS — R6889 Other general symptoms and signs: Secondary | ICD-10-CM | POA: Diagnosis not present

## 2019-05-21 MED ORDER — DICLOFENAC SODIUM 1 % TD GEL: 2.0000 g | Freq: Four times a day (QID) | TRANSDERMAL | 2 refills | Status: DC

## 2019-05-21 MED ORDER — CICLOPIROX 8 % EX SOLN: Freq: Every day | CUTANEOUS | 2 refills | Status: DC

## 2019-05-21 NOTE — Patient Instructions (Signed)
Diabetes Mellitus and Foot Care Foot care is an important part of your health, especially when you have diabetes. Diabetes may cause you to have problems because of poor blood flow (circulation) to your feet and legs, which can cause your skin to:  Become thinner and drier.  Break more easily.  Heal more slowly.  Peel and crack. You may also have nerve damage (neuropathy) in your legs and feet, causing decreased feeling in them. This means that you may not notice minor injuries to your feet that could lead to more serious problems. Noticing and addressing any potential problems early is the best way to prevent future foot problems. How to care for your feet Foot hygiene  Wash your feet daily with warm water and mild soap. Do not use hot water. Then, pat your feet and the areas between your toes until they are completely dry. Do not soak your feet as this can dry your skin.  Trim your toenails straight across. Do not dig under them or around the cuticle. File the edges of your nails with an emery board or nail file.  Apply a moisturizing lotion or petroleum jelly to the skin on your feet and to dry, brittle toenails. Use lotion that does not contain alcohol and is unscented. Do not apply lotion between your toes. Shoes and socks  Wear clean socks or stockings every day. Make sure they are not too tight. Do not wear knee-high stockings since they may decrease blood flow to your legs.  Wear shoes that fit properly and have enough cushioning. Always look in your shoes before you put them on to be sure there are no objects inside.  To break in new shoes, wear them for just a few hours a day. This prevents injuries on your feet. Wounds, scrapes, corns, and calluses  Check your feet daily for blisters, cuts, bruises, sores, and redness. If you cannot see the bottom of your feet, use a mirror or ask someone for help.  Do not cut corns or calluses or try to remove them with medicine.  If you  find a minor scrape, cut, or break in the skin on your feet, keep it and the skin around it clean and dry. You may clean these areas with mild soap and water. Do not clean the area with peroxide, alcohol, or iodine.  If you have a wound, scrape, corn, or callus on your foot, look at it several times a day to make sure it is healing and not infected. Check for: ? Redness, swelling, or pain. ? Fluid or blood. ? Warmth. ? Pus or a bad smell. General instructions  Do not cross your legs. This may decrease blood flow to your feet.  Do not use heating pads or hot water bottles on your feet. They may burn your skin. If you have lost feeling in your feet or legs, you may not know this is happening until it is too late.  Protect your feet from hot and cold by wearing shoes, such as at the beach or on hot pavement.  Schedule a complete foot exam at least once a year (annually) or more often if you have foot problems. If you have foot problems, report any cuts, sores, or bruises to your health care provider immediately. Contact a health care provider if:  You have a medical condition that increases your risk of infection and you have any cuts, sores, or bruises on your feet.  You have an injury that is not   healing.  You have redness on your legs or feet.  You feel burning or tingling in your legs or feet.  You have pain or cramps in your legs and feet.  Your legs or feet are numb.  Your feet always feel cold.  You have pain around a toenail. Get help right away if:  You have a wound, scrape, corn, or callus on your foot and: ? You have pain, swelling, or redness that gets worse. ? You have fluid or blood coming from the wound, scrape, corn, or callus. ? Your wound, scrape, corn, or callus feels warm to the touch. ? You have pus or a bad smell coming from the wound, scrape, corn, or callus. ? You have a fever. ? You have a red line going up your leg. Summary  Check your feet every day  for cuts, sores, red spots, swelling, and blisters.  Moisturize feet and legs daily.  Wear shoes that fit properly and have enough cushioning.  If you have foot problems, report any cuts, sores, or bruises to your health care provider immediately.  Schedule a complete foot exam at least once a year (annually) or more often if you have foot problems. This information is not intended to replace advice given to you by your health care provider. Make sure you discuss any questions you have with your health care provider. Document Released: 10/15/2000 Document Revised: 11/30/2017 Document Reviewed: 11/19/2016 Elsevier Patient Education  2020 [employer]  

## 2019-05-21 NOTE — Progress Notes (Signed)
Subjective:    Patient ID: Lindsay Ellis, female    DOB: [birth year], 43 y.o.   MRN: [id number]  HPI 43 year old female presents the office today for concerns of bilateral foot pain.  Majority pain is to the right foot submetatarsal 1 over the area of the callus.  She does try to trim it herself.  Also she states that she had her left fifth toe about 2 months ago and she still gets some discomfort and she points to the lateral fifth metatarsal head.  She thinks the pain is worsened or still continue because she is compensating because of the right foot pain.  She states that she is diabetic she is previously been diagnosed with neuropathy.  Her last A1c she reports was 6.2.  She is also interested in nail fungus treatment.  She is tried over-the-counter treatment without any significant improvement.  No open sores.   Review of Systems  All other systems reviewed and are negative.  Past Medical History:  Diagnosis Date  . Asthma   . Diabetes mellitus without complication ([place])   . Hyperlipidemia   . Hypertension   . Osteoarthritis   . Sickle cell trait ([place])   . Vertigo     Past Surgical History:  Procedure Laterality Date  . CESAREAN SECTION    . LUMBAR DISC SURGERY  2011     Current Outpatient Medications:  .  amitriptyline (ELAVIL) 100 MG tablet, Take 100 mg by mouth at bedtime., Disp: , Rfl:  .  atorvastatin (LIPITOR) 10 MG tablet, Take 10 mg by mouth daily., Disp: , Rfl:  .  baclofen (LIORESAL) 20 MG tablet, Take 30 mg by mouth 3 (three) times daily., Disp: , Rfl:  .  Calcium Carbonate-Vit D-Min (CALCIUM/VITAMIN D/MINERALS) 600-200 MG-UNIT TABS, Take by mouth., Disp: , Rfl:  .  ferrous sulfate 325 (65 FE) MG tablet, Take 325 mg by mouth daily with breakfast., Disp: , Rfl:  .  gabapentin (NEURONTIN) 800 MG tablet, Take 1,500 mg by mouth 3 (three) times daily., Disp: , Rfl:  .  hydrochlorothiazide (HYDRODIURIL) 12.5 MG tablet, Take 12.5 mg by mouth daily., Disp: , Rfl:  .   meclizine (ANTIVERT) 25 MG tablet, Take 25 mg by mouth 3 (three) times daily as needed for dizziness., Disp: , Rfl:  .  meloxicam (MOBIC) 15 MG tablet, Take 15 mg by mouth daily., Disp: , Rfl:  .  metFORMIN (GLUCOPHAGE-XR) 500 MG 24 hr tablet, 2 tablets 2 (two) times daily., Disp: , Rfl:  .  montelukast (SINGULAIR) 10 MG tablet, Take 10 mg by mouth at bedtime., Disp: , Rfl:  .  Multiple Vitamin (MULTIVITAMIN) tablet, Take 1 tablet by mouth daily., Disp: , Rfl:  .  PROAIR HFA 108 (90 Base) MCG/ACT inhaler, INL 2 PFS PO Q 4 H PRN, Disp: , Rfl: 6 .  SYMBICORT 160-4.5 MCG/ACT inhaler, INL 2 PFS PO BID, Disp: , Rfl: 6 .  ciclopirox (PENLAC) 8 % solution, Apply topically at bedtime. Apply over nail and surrounding skin. Apply daily over previous coat. After seven (7) days, may remove with alcohol and continue cycle., Disp: 6.6 mL, Rfl: 2 .  diclofenac sodium (VOLTAREN) 1 % GEL, Apply 2 g topically 4 (four) times daily. Rub into affected area of foot 2 to 4 times daily, Disp: 100 g, Rfl: 2  Allergies  Allergen Reactions  . Morphine And Related         Objective:   Physical Exam  General: AAO x3, NAD  Dermatological: Nails are mildly hypertrophic, dystrophic with yellow-brown discoloration.  No pain in the nails there is no surrounding redness or drainage or any signs of infection.  On the right foot submetatarsal 1 is a thick hyperkeratotic lesion without any underlying ulceration drainage or signs of infection.  No open lesions.  Vascular: Dorsalis Pedis artery and Posterior Tibial artery pedal pulses are 2/4 bilateral with immedate capillary fill time.  There is no pain with calf compression, swelling, warmth, erythema.   Neruologic: Sensation decreased with Semmes Weinstein monofilament  Musculoskeletal: Mild discomfort of the lateral aspect of metatarsal head.  There is no pain to the dorsal, plantar aspect metatarsal.  No pain to the digit.  Adductovarus is present.  Hammertoes are present.   Muscular strength 5/5 in all groups tested bilateral.  Plantarflexed first ray  Gait: Unassisted, Nonantalgic.      Assessment & Plan:  43 year old female with type 2 diabetes with neuropathy; left foot injury, capsulitis fifth MPJ; hyperkeratotic lesion right foot; onychomycosis -Treatment options discussed including all alternatives, risks, and complications -Etiology of symptoms were discussed -X-rays were obtained and reviewed with the patient. No evidence of acute fracture or stress fracture of the left foot. -Debrided the hyperkeratotic lesion right foot without any complications or bleeding -Regards left foot pain dispensed a gel offloading pad.  Voltaren gel as needed.  Consider steroid injection if needed. -Prescribed Penlac for nail fungus -Discussed the importance of daily foot inspection -Measured for diabetic shoes today with Liliane Channel.   Return in about 3 months (around 08/21/2019).  Trula Slade DPM

## 2019-05-27 DIAGNOSIS — G4733 Obstructive sleep apnea (adult) (pediatric): Secondary | ICD-10-CM | POA: Diagnosis not present

## 2019-05-31 DIAGNOSIS — J4520 Mild intermittent asthma, uncomplicated: Secondary | ICD-10-CM | POA: Diagnosis not present

## 2019-06-27 DIAGNOSIS — G4733 Obstructive sleep apnea (adult) (pediatric): Secondary | ICD-10-CM | POA: Diagnosis not present

## 2019-07-01 DIAGNOSIS — J4520 Mild intermittent asthma, uncomplicated: Secondary | ICD-10-CM | POA: Diagnosis not present

## 2019-08-01 DIAGNOSIS — J4520 Mild intermittent asthma, uncomplicated: Secondary | ICD-10-CM | POA: Diagnosis not present

## 2019-08-21 ENCOUNTER — Ambulatory Visit (INDEPENDENT_AMBULATORY_CARE_PROVIDER_SITE_OTHER): Payer: Medicare HMO | Admitting: Podiatry

## 2019-08-21 ENCOUNTER — Other Ambulatory Visit: Payer: Self-pay

## 2019-08-21 DIAGNOSIS — M79675 Pain in left toe(s): Secondary | ICD-10-CM

## 2019-08-21 DIAGNOSIS — Q828 Other specified congenital malformations of skin: Secondary | ICD-10-CM | POA: Diagnosis not present

## 2019-08-21 DIAGNOSIS — R6889 Other general symptoms and signs: Secondary | ICD-10-CM | POA: Diagnosis not present

## 2019-08-21 DIAGNOSIS — B351 Tinea unguium: Secondary | ICD-10-CM

## 2019-08-21 DIAGNOSIS — M79674 Pain in right toe(s): Secondary | ICD-10-CM

## 2019-08-21 DIAGNOSIS — E1149 Type 2 diabetes mellitus with other diabetic neurological complication: Secondary | ICD-10-CM | POA: Diagnosis not present

## 2019-08-21 NOTE — Patient Instructions (Signed)
Keep the bandage on for 24 hours. At that time, remove and clean with soap and water. If it hurts or burns before 24 hours go ahead and remove the bandage and wash with soap and water. Keep the area clean. If there is any blistering cover with antibiotic ointment and a bandage. Monitor for any redness, drainage, or other signs of infection. Call the office if any are to occur. If you have any questions, please call the office at [phone].  

## 2019-08-21 NOTE — Progress Notes (Signed)
Subjective: 43 y.o. returns the office today for painful, elongated, thickened toenails which she cannot trim herself as well as for callus on the right foot submetatarsal 1. Denies any redness or drainage around the nails. Denies any acute changes since last appointment and no new complaints today. Denies any systemic complaints such as fevers, chills, nausea, vomiting.   Awaiting diabetic shoes.  They are apparently on backorder.  Last morning blood sugar she reports of 115.  Last A1c 6.5.  PCP: Merrilee Seashore, MD  Objective: AAO 3, NAD DP/PT pulses palpable, CRT less than 3 seconds Nails hypertrophic, dystrophic, elongated, brittle, discolored 10. There is tenderness overlying the nails 1-5 bilaterally. There is no surrounding erythema or drainage along the nail sites. Thick hyperkeratotic lesion right foot submetatarsal 1.  No ongoing ulceration or signs of infection. Plantarflexed first ray No open lesions or pre-ulcerative lesions are identified. No other areas of tenderness bilateral lower extremities. No overlying edema, erythema, increased warmth. No pain with calf compression, swelling, warmth, erythema.  Assessment: Patient presents with symptomatic onychomycosis; hyperkeratotic lesion  Plan: -Treatment options including alternatives, risks, complications were discussed -Nails sharply debrided 10 without complication/bleeding. -Discussed daily foot inspection. If there are any changes, to call the office immediately.  -Hyperkeratotic lesion sharp debrided x1 without any complications or bleeding.  Areas cleaned with alcohol. Pad was placed followed by salicylic acid and a bandage.  Post procedure instructions discussed.  Monitor for any signs or symptoms of infection. -Follow-up in 3 months or sooner if any problems are to arise. In the meantime, encouraged to call the office with any questions, concerns, changes symptoms.  Celesta Gentile, DPM

## 2019-08-31 DIAGNOSIS — J4520 Mild intermittent asthma, uncomplicated: Secondary | ICD-10-CM | POA: Diagnosis not present

## 2019-09-18 DIAGNOSIS — R6889 Other general symptoms and signs: Secondary | ICD-10-CM | POA: Diagnosis not present

## 2019-09-18 DIAGNOSIS — H04123 Dry eye syndrome of bilateral lacrimal glands: Secondary | ICD-10-CM | POA: Diagnosis not present

## 2019-09-18 DIAGNOSIS — E119 Type 2 diabetes mellitus without complications: Secondary | ICD-10-CM | POA: Diagnosis not present

## 2019-09-18 DIAGNOSIS — H5203 Hypermetropia, bilateral: Secondary | ICD-10-CM | POA: Diagnosis not present

## 2019-09-18 DIAGNOSIS — Z7984 Long term (current) use of oral hypoglycemic drugs: Secondary | ICD-10-CM | POA: Diagnosis not present

## 2019-09-18 DIAGNOSIS — H524 Presbyopia: Secondary | ICD-10-CM | POA: Diagnosis not present

## 2019-09-18 DIAGNOSIS — H52223 Regular astigmatism, bilateral: Secondary | ICD-10-CM | POA: Diagnosis not present

## 2019-10-01 DIAGNOSIS — J4520 Mild intermittent asthma, uncomplicated: Secondary | ICD-10-CM | POA: Diagnosis not present

## 2019-10-01 DIAGNOSIS — Z23 Encounter for immunization: Secondary | ICD-10-CM | POA: Diagnosis not present

## 2019-10-01 DIAGNOSIS — R6889 Other general symptoms and signs: Secondary | ICD-10-CM | POA: Diagnosis not present

## 2019-10-31 DIAGNOSIS — J4520 Mild intermittent asthma, uncomplicated: Secondary | ICD-10-CM | POA: Diagnosis not present

## 2019-11-14 DIAGNOSIS — R6889 Other general symptoms and signs: Secondary | ICD-10-CM | POA: Diagnosis not present

## 2019-11-14 DIAGNOSIS — E782 Mixed hyperlipidemia: Secondary | ICD-10-CM | POA: Diagnosis not present

## 2019-11-14 DIAGNOSIS — I10 Essential (primary) hypertension: Secondary | ICD-10-CM | POA: Diagnosis not present

## 2019-11-14 DIAGNOSIS — E1165 Type 2 diabetes mellitus with hyperglycemia: Secondary | ICD-10-CM | POA: Diagnosis not present

## 2019-11-21 DIAGNOSIS — Z23 Encounter for immunization: Secondary | ICD-10-CM | POA: Diagnosis not present

## 2019-11-21 DIAGNOSIS — R6889 Other general symptoms and signs: Secondary | ICD-10-CM | POA: Diagnosis not present

## 2019-11-21 DIAGNOSIS — E1165 Type 2 diabetes mellitus with hyperglycemia: Secondary | ICD-10-CM | POA: Diagnosis not present

## 2019-11-21 DIAGNOSIS — D573 Sickle-cell trait: Secondary | ICD-10-CM | POA: Diagnosis not present

## 2019-11-21 DIAGNOSIS — Z6841 Body Mass Index (BMI) 40.0 and over, adult: Secondary | ICD-10-CM | POA: Diagnosis not present

## 2019-11-21 DIAGNOSIS — M545 Low back pain: Secondary | ICD-10-CM | POA: Diagnosis not present

## 2019-11-21 DIAGNOSIS — I10 Essential (primary) hypertension: Secondary | ICD-10-CM | POA: Diagnosis not present

## 2019-11-21 DIAGNOSIS — G9050 Complex regional pain syndrome I, unspecified: Secondary | ICD-10-CM | POA: Diagnosis not present

## 2019-11-21 DIAGNOSIS — E782 Mixed hyperlipidemia: Secondary | ICD-10-CM | POA: Diagnosis not present

## 2019-11-22 ENCOUNTER — Other Ambulatory Visit: Payer: Self-pay

## 2019-11-22 ENCOUNTER — Encounter: Payer: Self-pay | Admitting: Podiatry

## 2019-11-22 ENCOUNTER — Ambulatory Visit (INDEPENDENT_AMBULATORY_CARE_PROVIDER_SITE_OTHER): Payer: Medicare HMO | Admitting: Orthotics

## 2019-11-22 ENCOUNTER — Ambulatory Visit (INDEPENDENT_AMBULATORY_CARE_PROVIDER_SITE_OTHER): Payer: Medicare HMO | Admitting: Podiatry

## 2019-11-22 DIAGNOSIS — B351 Tinea unguium: Secondary | ICD-10-CM

## 2019-11-22 DIAGNOSIS — M7751 Other enthesopathy of right foot: Secondary | ICD-10-CM | POA: Diagnosis not present

## 2019-11-22 DIAGNOSIS — E1149 Type 2 diabetes mellitus with other diabetic neurological complication: Secondary | ICD-10-CM | POA: Diagnosis not present

## 2019-11-22 DIAGNOSIS — M79675 Pain in left toe(s): Secondary | ICD-10-CM | POA: Diagnosis not present

## 2019-11-22 DIAGNOSIS — R6889 Other general symptoms and signs: Secondary | ICD-10-CM | POA: Diagnosis not present

## 2019-11-22 DIAGNOSIS — M79674 Pain in right toe(s): Secondary | ICD-10-CM

## 2019-11-22 DIAGNOSIS — M7752 Other enthesopathy of left foot: Secondary | ICD-10-CM | POA: Diagnosis not present

## 2019-11-22 DIAGNOSIS — Q828 Other specified congenital malformations of skin: Secondary | ICD-10-CM

## 2019-11-22 DIAGNOSIS — M779 Enthesopathy, unspecified: Secondary | ICD-10-CM

## 2019-11-22 MED ORDER — CICLOPIROX 8 % EX SOLN: Freq: Every day | CUTANEOUS | 2 refills | Status: DC

## 2019-11-22 MED ORDER — DICLOFENAC SODIUM 1 % EX GEL: 2.0000 g | Freq: Four times a day (QID) | CUTANEOUS | 2 refills | Status: DC

## 2019-11-22 NOTE — Progress Notes (Signed)

## 2019-11-23 NOTE — Progress Notes (Signed)
Subjective: 44 y.o. returns the office today for painful, elongated, thickened toenails which she cannot trim herself as well as for callus on the right foot submetatarsal 1. Denies any redness or drainage around the nails.  Also presents to pick up diabetic shoes.  Denies any acute changes since last appointment and no new complaints today. Denies any systemic complaints such as fevers, chills, nausea, vomiting.   PCP: Merrilee Seashore, MD-last seen May 09, 2019  Objective: AAO 3, NAD DP/PT pulses palpable, CRT less than 3 seconds Nails hypertrophic, dystrophic, elongated, brittle, discolored 10. There is tenderness overlying the nails 1-5 bilaterally. There is no surrounding erythema or drainage along the nail sites. Thick hyperkeratotic lesion right foot submetatarsal 1.  No ongoing ulceration or signs of infection. Plantarflexed first ray No open lesions or pre-ulcerative lesions are identified. No other areas of tenderness bilateral lower extremities. No overlying edema, erythema, increased warmth. No pain with calf compression, swelling, warmth, erythema.  Assessment: Patient presents with symptomatic onychomycosis; hyperkeratotic lesion  Plan: -Treatment options including alternatives, risks, complications were discussed -Nails sharply debrided 10 without complication/bleeding. -Discussed daily foot inspection. If there are any changes, to call the office immediately.  -Hyperkeratotic lesion sharp debrided x1 without any complications or bleeding. -Diabetic shoes were dispensed with Liliane Channel. -Follow-up in 3 months or sooner if any problems are to arise. In the meantime, encouraged to call the office with any questions, concerns, changes symptoms.  Celesta Gentile, DPM

## 2020-02-21 ENCOUNTER — Ambulatory Visit: Payer: Medicare HMO | Admitting: Podiatry

## 2020-05-08 DIAGNOSIS — E1165 Type 2 diabetes mellitus with hyperglycemia: Secondary | ICD-10-CM | POA: Diagnosis not present

## 2020-05-08 DIAGNOSIS — I10 Essential (primary) hypertension: Secondary | ICD-10-CM | POA: Diagnosis not present

## 2020-05-08 DIAGNOSIS — R6889 Other general symptoms and signs: Secondary | ICD-10-CM | POA: Diagnosis not present

## 2020-05-08 DIAGNOSIS — Z0000 Encounter for general adult medical examination without abnormal findings: Secondary | ICD-10-CM | POA: Diagnosis not present

## 2020-05-15 DIAGNOSIS — R6889 Other general symptoms and signs: Secondary | ICD-10-CM | POA: Diagnosis not present

## 2020-05-15 DIAGNOSIS — E782 Mixed hyperlipidemia: Secondary | ICD-10-CM | POA: Diagnosis not present

## 2020-05-15 DIAGNOSIS — D573 Sickle-cell trait: Secondary | ICD-10-CM | POA: Diagnosis not present

## 2020-05-15 DIAGNOSIS — E1165 Type 2 diabetes mellitus with hyperglycemia: Secondary | ICD-10-CM | POA: Diagnosis not present

## 2020-05-15 DIAGNOSIS — Z0000 Encounter for general adult medical examination without abnormal findings: Secondary | ICD-10-CM | POA: Diagnosis not present

## 2020-05-15 DIAGNOSIS — M5441 Lumbago with sciatica, right side: Secondary | ICD-10-CM | POA: Diagnosis not present

## 2020-05-30 DIAGNOSIS — J4520 Mild intermittent asthma, uncomplicated: Secondary | ICD-10-CM | POA: Diagnosis not present

## 2020-10-02 ENCOUNTER — Other Ambulatory Visit: Payer: Self-pay

## 2020-10-02 ENCOUNTER — Encounter: Payer: Self-pay | Admitting: Neurology

## 2020-10-02 ENCOUNTER — Ambulatory Visit (INDEPENDENT_AMBULATORY_CARE_PROVIDER_SITE_OTHER): Payer: Medicare HMO | Admitting: Neurology

## 2020-10-02 VITALS — BP 132/81 | HR 86 | Ht 62.0 in | Wt 242.0 lb

## 2020-10-02 DIAGNOSIS — Z91199 Patient's noncompliance with other medical treatment and regimen due to unspecified reason: Secondary | ICD-10-CM

## 2020-10-02 DIAGNOSIS — R438 Other disturbances of smell and taste: Secondary | ICD-10-CM | POA: Insufficient documentation

## 2020-10-02 DIAGNOSIS — U099 Post covid-19 condition, unspecified: Secondary | ICD-10-CM | POA: Insufficient documentation

## 2020-10-02 DIAGNOSIS — Z9114 Patient's other noncompliance with medication regimen: Secondary | ICD-10-CM | POA: Diagnosis not present

## 2020-10-02 DIAGNOSIS — R439 Unspecified disturbances of smell and taste: Secondary | ICD-10-CM | POA: Diagnosis not present

## 2020-10-02 DIAGNOSIS — R053 Chronic cough: Secondary | ICD-10-CM | POA: Diagnosis not present

## 2020-10-02 DIAGNOSIS — G4719 Other hypersomnia: Secondary | ICD-10-CM | POA: Diagnosis not present

## 2020-10-02 DIAGNOSIS — G4733 Obstructive sleep apnea (adult) (pediatric): Secondary | ICD-10-CM

## 2020-10-02 HISTORY — DX: Patient's noncompliance with other medical treatment and regimen due to unspecified reason: Z91.199

## 2020-10-02 NOTE — Patient Instructions (Signed)
COVID-19 Loss of smell, also known as ANOSMIA, is one of the most common symptoms of COVID-19.   [facility] Professor of Otolaryngology Daphane Shepherd, MD, and Assistant Professor Annitta Jersey, MD, have investigated several treatments for persistent anosmia (loss of smell), with a special interest in viral-related anosmia.     As the number of total, confirmed COVID-19 cases increases so does the number of people suffering from disease-related anosmia, making anosmia a significant public health problem.     Rehabilitation of anosmia :   Smell daily one sample of the following categories; and look at a picture of the object - f. Example at a picture of a lemon while smelling a lemon oil.   A floral scent  - such as rose, lavender, or vanilla - all would qualify for these.  A spice scent - nutmeg, anise, coffee, cinnamon-  A citrus smell - lemon, lime, orange  A menthol or minty scent, or eucalyptus.   Goal is to re-associate scent ( and eventually taste ) to the image.  The success is gradual, and this form of rehabilitation may take 12 month to succeed.   Another complication is PAROSMIA - smelling something that is not present, often an unpleasant smell of burning rubber or spoiled , foul smells.   This can be reduced by using a carbamazepine at 100 mg po bid. This antiepileptic medication slows the nerve conduction and allows the reduction in abnormal smell sensation.    Larey Seat, MD         COVID-19 is a respiratory infection that is caused by a virus called severe acute respiratory syndrome coronavirus 2 (SARS-CoV-2). The disease is also known as coronavirus disease or novel coronavirus. In some people, the virus may not cause any symptoms. In others, it may cause a serious infection. The infection can get worse quickly and can lead to complications, such as:  Pneumonia, or infection of the lungs.  Acute respiratory distress syndrome or ARDS. This  is a condition in which fluid build-up in the lungs prevents the lungs from filling with air and passing oxygen into the blood.  Acute respiratory failure. This is a condition in which there is not enough oxygen passing from the lungs to the body or when carbon dioxide is not passing from the lungs out of the body.  Sepsis or septic shock. This is a serious bodily reaction to an infection.  Blood clotting problems.  Secondary infections due to bacteria or fungus.  Organ failure. This is when your body's organs stop working. The virus that causes COVID-19 is contagious. This means that it can spread from person to person through droplets from coughs and sneezes (respiratory secretions). What are the causes? This illness is caused by a virus. You may catch the virus by:  Breathing in droplets from an infected person. Droplets can be spread by a person breathing, speaking, singing, coughing, or sneezing.  Touching something, like a table or a doorknob, that was exposed to the virus (contaminated) and then touching your mouth, nose, or eyes. What increases the risk? Risk for infection You are more likely to be infected with this virus if you:  Are within 6 feet (2 meters) of a person with COVID-19.  Provide care for or live with a person who is infected with COVID-19.  Spend time in crowded indoor spaces or live in shared housing. Risk for serious illness You are more likely to become seriously ill from the virus if you:  Are 44 years of age or older. The higher your age, the more you are at risk for serious illness.  Live in a nursing home or long-term care facility.  Have cancer.  Have a long-term (chronic) disease such as: ? Chronic lung disease, including chronic obstructive pulmonary disease or asthma. ? A long-term disease that lowers your body's ability to fight infection (immunocompromised). ? Heart disease, including heart failure, a condition in which the arteries that  lead to the heart become narrow or blocked (coronary artery disease), a disease which makes the heart muscle thick, weak, or stiff (cardiomyopathy). ? Diabetes. ? Chronic kidney disease. ? Sickle cell disease, a condition in which red blood cells have an abnormal "sickle" shape. ? Liver disease.  Are obese. What are the signs or symptoms? Symptoms of this condition can range from mild to severe. Symptoms may appear any time from 2 to 14 days after being exposed to the virus. They include:  A fever or chills.  A cough.  Difficulty breathing.  Headaches, body aches, or muscle aches.  Runny or stuffy (congested) nose.  A sore throat.  New loss of taste or smell. Some people may also have stomach problems, such as nausea, vomiting, or diarrhea. Other people may not have any symptoms of COVID-19. How is this diagnosed? This condition may be diagnosed based on:  Your signs and symptoms, especially if: ? You live in an area with a COVID-19 outbreak. ? You recently traveled to or from an area where the virus is common. ? You provide care for or live with a person who was diagnosed with COVID-19. ? You were exposed to a person who was diagnosed with COVID-19.  A physical exam.  Lab tests, which may include: ? Taking a sample of fluid from the back of your nose and throat (nasopharyngeal fluid), your nose, or your throat using a swab. ? A sample of mucus from your lungs (sputum). ? Blood tests.  Imaging tests, which may include, X-rays, CT scan, or ultrasound. How is this treated? At present, there is no medicine to treat COVID-19. Medicines that treat other diseases are being used on a trial basis to see if they are effective against COVID-19. Your health care provider will talk with you about ways to treat your symptoms. For most people, the infection is mild and can be managed at home with rest, fluids, and over-the-counter medicines. Treatment for a serious infection usually  takes places in a hospital intensive care unit (ICU). It may include one or more of the following treatments. These treatments are given until your symptoms improve.  Receiving fluids and medicines through an IV.  Supplemental oxygen. Extra oxygen is given through a tube in the nose, a face mask, or a hood.  Positioning you to lie on your stomach (prone position). This makes it easier for oxygen to get into the lungs.  Continuous positive airway pressure (CPAP) or bi-level positive airway pressure (BPAP) machine. This treatment uses mild air pressure to keep the airways open. A tube that is connected to a motor delivers oxygen to the body.  Ventilator. This treatment moves air into and out of the lungs by using a tube that is placed in your windpipe.  Tracheostomy. This is a procedure to create a hole in the neck so that a breathing tube can be inserted.  Extracorporeal membrane oxygenation (ECMO). This procedure gives the lungs a chance to recover by taking over the functions of the heart and lungs. It  supplies oxygen to the body and removes carbon dioxide. Follow these instructions at home: Lifestyle  If you are sick, stay home except to get medical care. Your health care provider will tell you how long to stay home. Call your health care provider before you go for medical care.  Rest at home as told by your health care provider.  Do not use any products that contain nicotine or tobacco, such as cigarettes, e-cigarettes, and chewing tobacco. If you need help quitting, ask your health care provider.  Return to your normal activities as told by your health care provider. Ask your health care provider what activities are safe for you. General instructions  Take over-the-counter and prescription medicines only as told by your health care provider.  Drink enough fluid to keep your urine pale yellow.  Keep all follow-up visits as told by your health care provider. This is important. How  is this prevented?  There is no vaccine to help prevent COVID-19 infection. However, there are steps you can take to protect yourself and others from this virus. To protect yourself:   Do not travel to areas where COVID-19 is a risk. The areas where COVID-19 is reported change often. To identify high-risk areas and travel restrictions, check the CDC travel website: FatFares.com.br  If you live in, or must travel to, an area where COVID-19 is a risk, take precautions to avoid infection. ? Stay away from people who are sick. ? Wash your hands often with soap and water for 20 seconds. If soap and water are not available, use an alcohol-based hand sanitizer. ? Avoid touching your mouth, face, eyes, or nose. ? Avoid going out in public, follow guidance from your state and local health authorities. ? If you must go out in public, wear a cloth face covering or face mask. Make sure your mask covers your nose and mouth. ? Avoid crowded indoor spaces. Stay at least 6 feet (2 meters) away from others. ? Disinfect objects and surfaces that are frequently touched every day. This may include:  Counters and tables.  Doorknobs and light switches.  Sinks and faucets.  Electronics, such as phones, remote controls, keyboards, computers, and tablets. To protect others: If you have symptoms of COVID-19, take steps to prevent the virus from spreading to others.  If you think you have a COVID-19 infection, contact your health care provider right away. Tell your health care team that you think you may have a COVID-19 infection.  Stay home. Leave your house only to seek medical care. Do not use public transport.  Do not travel while you are sick.  Wash your hands often with soap and water for 20 seconds. If soap and water are not available, use alcohol-based hand sanitizer.  Stay away from other members of your household. Let healthy household members care for children and pets, if possible. If  you have to care for children or pets, wash your hands often and wear a mask. If possible, stay in your own room, separate from others. Use a different bathroom.  Make sure that all people in your household wash their hands well and often.  Cough or sneeze into a tissue or your sleeve or elbow. Do not cough or sneeze into your hand or into the air.  Wear a cloth face covering or face mask. Make sure your mask covers your nose and mouth. Where to find more information  Centers for Disease Control and Prevention: PurpleGadgets.be  World Health Organization: https://www.castaneda.info/ Contact a  health care provider if:  You live in or have traveled to an area where COVID-19 is a risk and you have symptoms of the infection.  You have had contact with someone who has COVID-19 and you have symptoms of the infection. Get help right away if:  You have trouble breathing.  You have pain or pressure in your chest.  You have confusion.  You have bluish lips and fingernails.  You have difficulty waking from sleep.  You have symptoms that get worse. These symptoms may represent a serious problem that is an emergency. Do not wait to see if the symptoms will go away. Get medical help right away. Call your local emergency services (911 in the [place] Do not drive yourself to the hospital. Let the emergency medical personnel know if you think you have COVID-19. Summary  COVID-19 is a respiratory infection that is caused by a virus. It is also known as coronavirus disease or novel coronavirus. It can cause serious infections, such as pneumonia, acute respiratory distress syndrome, acute respiratory failure, or sepsis.  The virus that causes COVID-19 is contagious. This means that it can spread from person to person through droplets from breathing, speaking, singing, coughing, or sneezing.  You are more likely to develop a serious illness if you are 82 years of  age or older, have a weak immune system, live in a nursing home, or have chronic disease.  There is no medicine to treat COVID-19. Your health care provider will talk with you about ways to treat your symptoms.  Take steps to protect yourself and others from infection. Wash your hands often and disinfect objects and surfaces that are frequently touched every day. Stay away from people who are sick and wear a mask if you are sick. This information is not intended to replace advice given to you by your health care provider. Make sure you discuss any questions you have with your health care provider. Document Revised: 08/17/2019 Document Reviewed: 11/23/2018 Elsevier Patient Education  [place]. CPAP and BPAP Information CPAP and BPAP are methods of helping a person breathe with the use of air pressure. CPAP stands for "continuous positive airway pressure." BPAP stands for "bi-level positive airway pressure." In both methods, air is blown through your nose or mouth and into your air passages to help you breathe well. CPAP and BPAP use different amounts of pressure to blow air. With CPAP, the amount of pressure stays the same while you breathe in and out. With BPAP, the amount of pressure is increased when you breathe in (inhale) so that you can take larger breaths. Your health care provider will recommend whether CPAP or BPAP would be more helpful for you. Why are CPAP and BPAP treatments used? CPAP or BPAP can be helpful if you have:  Sleep apnea.  Chronic obstructive pulmonary disease (COPD).  Heart failure.  Medical conditions that weaken the muscles of the chest including muscular dystrophy, or neurological diseases such as amyotrophic lateral sclerosis (ALS).  Other problems that cause breathing to be weak, abnormal, or difficult. CPAP is most commonly used for obstructive sleep apnea (OSA) to keep the airways from collapsing when the muscles relax during sleep. How is CPAP or  BPAP administered? Both CPAP and BPAP are provided by a small machine with a flexible plastic tube that attaches to a plastic mask. You wear the mask. Air is blown through the mask into your nose or mouth. The amount of pressure that is used to blow  the air can be adjusted on the machine. Your health care provider will determine the pressure setting that should be used based on your individual needs. When should CPAP or BPAP be used? In most cases, the mask only needs to be worn during sleep. Generally, the mask needs to be worn throughout the night and during any daytime naps. People with certain medical conditions may also need to wear the mask at other times when they are awake. Follow instructions from your health care provider about when to use the machine. What are some tips for using the mask?   Because the mask needs to be snug, some people feel trapped or closed-in (claustrophobic) when first using the mask. If you feel this way, you may need to get used to the mask. One way to do this is by holding the mask loosely over your nose or mouth and then gradually applying the mask more snugly. You can also gradually increase the amount of time that you use the mask.  Masks are available in various types and sizes. Some fit over your mouth and nose while others fit over just your nose. If your mask does not fit well, talk with your health care provider about getting a different one.  If you are using a mask that fits over your nose and you tend to breathe through your mouth, a chin strap may be applied to help keep your mouth closed.  The CPAP and BPAP machines have alarms that may sound if the mask comes off or develops a leak.  If you have trouble with the mask, it is very important that you talk with your health care provider about finding a way to make the mask easier to tolerate. Do not stop using the mask. Stopping the use of the mask could have a negative impact on your health. What are  some tips for using the machine?  Place your CPAP or BPAP machine on a secure table or stand near an electrical outlet.  Know where the on/off switch is located on the machine.  Follow instructions from your health care provider about how to set the pressure on your machine and when you should use it.  Do not eat or drink while the CPAP or BPAP machine is on. Food or fluids could get pushed into your lungs by the pressure of the CPAP or BPAP.  Do not smoke. Tobacco smoke residue can damage the machine.  For home use, CPAP and BPAP machines can be rented or purchased through home health care companies. Many different brands of machines are available. Renting a machine before purchasing may help you find out which particular machine works well for you.  Keep the CPAP or BPAP machine and attachments clean. Ask your health care provider for specific instructions. Get help right away if:  You have redness or open areas around your nose or mouth where the mask fits.  You have trouble using the CPAP or BPAP machine.  You cannot tolerate wearing the CPAP or BPAP mask.  You have pain, discomfort, and bloating in your abdomen. Summary  CPAP and BPAP are methods of helping a person breathe with the use of air pressure.  Both CPAP and BPAP are provided by a small machine with a flexible plastic tube that attaches to a plastic mask.  If you have trouble with the mask, it is very important that you talk with your health care provider about finding a way to make the mask easier to  tolerate. This information is not intended to replace advice given to you by your health care provider. Make sure you discuss any questions you have with your health care provider. Document Revised: 02/07/2019 Document Reviewed: 09/06/2016 Elsevier Patient Education  [place].

## 2020-10-02 NOTE — Progress Notes (Signed)
SLEEP MEDICINE CLINIC   Provider:  Larey Seat, MD    Primary Care Physician:  Merrilee Seashore, MD   Referring Provider: Merrilee Seashore, MD    Chief Complaint  Patient presents with  . Follow-up    pt alone, rm 10. presents today as a follow up visit. she had not been using machine until recently. she received new mask and filters and restarted recently and states she tolerates using the CPAP. DME [employer] [facility]. last SS 2019. She was able to get supplies and has started using the machine with no problems. she will need to remain compliant for insurance to continue to cover supplies in future.     HPI: this is a delayed Rv or thsi establsihed patient, last seen in January 2020 by Np Cecille Rubin, who is now retired.   Lindsay Ellis is a 44 y.o. female patient ,was  seen here as upon referral on 04-04-2018  from Dr. [name] for a sleep apnea work up. The patient was  excessively daytime sleepy. She is an Electrical engineer patient. She presents today also having undergone 2 sleep tests home sleep test on her watch pat was initially done on 05/29/2018 at the time her AHI was 65.5/h lowest point of oxygen saturation was 77% and her time in oxygen saturation was almost 20% of the night 62 minutes.  The impression was that of severe sleep apnea overlapping with hypoxemia and family was snoring she also had tachybradycardia.  The home sleep test was repeated in December due to low compliance with CPAP and she could not get supplies I did use the machine only 43% of the time.  For the time she had used the machine however with an AutoSet pressure between 5 minimum and 18 cm maximum pressure with 3 cm EPR her residual AHI was only 2.6/h which speaks for a very good control so again this home sleep test was repeated In Order to Get Her Compliance Reinstated and to get supplies the AHI was again 65.9/h and that night oxygen saturation at the lowest was now 87% so that seems to have been an  improvement and she did have much less long oxygen desats.  She states she contracted COVID-19 in March 2020, leading to difficulties again with CPAP use, chronic dry hacking cough, exacerbation of wheezing and shortness of breath.  In short she became noncompliant again.  Her risk factors are also ongoing tobacco use and a BMI of 44.26.   I have no doubt that she still has sleep apnea but according to Medicare guidelines I will yet have to do another sleep test because it can be older than 6 months in order to give her new supplies.  She did not use her CPAP machine for the last week or so and it showed an AHI of 1.6 for that short.  Of use 10 out of 14 days average user time was 5 hours 1 minute, the pressure was still between 5 and 18 with 3 cm EPR there were no central apneas emerging and her 95th percentile pressure need was 13.7 cm water.  There are some large air leaks intermittently this may be related to losing contact with the mask which is now over 70-year-old female just not feeling well.  She is using a nasal mask and 30.  The patient has changed modifications of her diet further and she has implemented an exercise regimen as much as she can do with shortness of breath.  She lost approximately  15 pounds over the autumn months.  Her current medication list was updated.  Her blood pressure is well controlled and in the office her oxygen saturation were 96% at rest.  She does have conditions of diabetes type 2 with hyperglycemia and without long-term use of insulin, she does have diabetic nephropathy associated with diabetes type 2 she does have severe obstructive sleep apnea overlapping with would be perceived as COPD, essential hypertension, mixed hyperlipidemia and also intermittent asthma.  Addendum the patient is now fully vaccinated.  She takes cyclobenzaprine this has helped her to sleep helps for tension and muscle spasms.  She has not had persistent vertigo complaints.  These were present when she  initially recovered from covid.  She has lost part of her senses of smell and taste, remaining dysphonic.              09-13-2018 , revisit with Lindsay Ellis, a 44 year old female patient who presented originally with excessive daytime sleepiness and was followed with a home sleep study on apnea link on 31 May 2018.  She had endorsed the Epworth sleepiness score of 15 out of 24 points, fatigue severity 56 out of 63 points BMI was 48, the home sleep study revealed severe sleep apnea with an AHI of 65.5/h RDI was even slightly higher and indicating loud snoring, she did have 62 minutes of oxygen desaturation more than 80% of the total sleep time recorded.  Heart rate varied between bradycardia and tachycardia.  Based on the responses I would have preferred to have an attended CPAP or BiPAP titration, this device tolerate this degree of apnea is usually not treatable with a dental device, her insurance denied an attended sleep study therefore we had only the options of doing a auto CPAP.  She has been 43% compliant for the last 30 days, AutoSet is encompassing a pressure window between 5 and 18 cmH2O with 3 cm EPR, residual AHI is positive at 2.6 apneas per hour no central apneas are emerging, 95th percentile pressure is 16 cmH2O. The patient reports that she is so fatigued she often falls asleep before she has put the CPAP on.  I would very much for like for her to develop a ritual at night so that she at least uses CPAP for 4 hours, the reduced AHI is a very good outcome.       Lindsay Ellis presents today as a 44 year old African-American right-handed female currently wearing a hand brace wrist brace for the treatment of  RSD. She has struggled with diabetes and obesity for much of her adult life, and recently moved to [place] in October 2018.  She lived in [place] before for several years earlier she had undergone a sleep study and was diagnosed with obstructive sleep apnea,  provided a CPAP machine but this got lost in the move.  Besides diabetes and morbid obesity she has problems with high cholesterol, joint pain aching muscles numbness dizziness and has been told that she snores.  She would like to resume CPAP treatment but will undergo with me to undergo a new sleep study for it.  Aside from her medical history I reviewed her medication list the patient is currently on a high dose of gabapentin she states she is taking she is taking 1500 mg 3 times a day a total of 4.5 g daily.  She is taking hydrochlorothiazide, meclizine, meloxicam as needed, Singulair as needed, baclofen 30 mg 3 times a day, Lipitor and Elavil.  Elavil  is at 100 mg at bedtime.  It is supposed to help her sleep 2.  Aside from Symbicort she has also a pro-air inhaler as needed.  She reports that she has today an elevated blood pressure but this is not unusual for her.  She was diagnosed with essential hypertension, she believes that her blood pressure is elevated but in pain.  She also carries a sickle cell trait not the disease.  She has not used insulin in the past for treatment of diabetes.  She reports frequent vertigo.  She relates her vertigo is also related to her blood pressure.  She denies any headaches associated with high blood pressures.   Sleep habits are as follows: She takes her evening medications at 8 Pm, and is asleep by 10 Pm, but has nocturia 3-5 times every night, has been woken air hungry, and frequent vomiting at night.  The patient usually retreats to her Ellis before 10 PM and with the help of medication finds herself ready to sleep soon, she sleeps on 2 pillows on a flat mattress, the Ellis is described as cool, quiet and dark.  She is often so drowsy that she even falls asleep on the commode, has no trouble to get back to sleep after her frequent bathroom breaks.  More trouble some of the arousals from air hunger weakness, the feeling of choking or suffocations. She is panicked  after these arousals 2-3 times a week.  She cannot recall dreaming. She has been told she snores thunderously, she sleeps prone, but turns each night on her back- and snores and has apnea. .  She rises at 6. 45 AM to get her 2 children ready for school. She has custody of her 55 year old granddaughter, too. She is tired, she does not feel refreshed or restored in the mornings.  She denies any morning headaches, and she has not been woken by headaches out of sleep.  She wakes with a parched, dry mouth.  She has been woken by palpitations, feeling clammy and diaphoretic, feeling panicked. She reports she takes naps in daytime as many as she can, as long as she can.  Often these are following the irresistible urge to sleep she did not schedule her naps or plan to nap.  A nap can take an hour.  She does not feel that power naps of less duration would refresher at all.  Sleep medical history and family sleep history: adopted. Was a sleep walker as a young adult and until recently.   Social history: 2 biological children and one grandchild in her custody, moved for [place] to [place] in 10/ 2018.  She currently smokes about 5 to 6 cigarettes a day, has been a smoker since 20 years ago.  She does not drink alcohol, she does use caffeinated beverages.  She drinks 1 cup of coffee a day no sodas, no iced tea no energy drinks.   Review of Systems: Out of a complete 14 system review, the patient complains of only the following symptoms, and all other reviewed systems are negative.  How likely are you to doze in the following situations: 0 = not likely, 1 = slight chance, 2 = moderate chance, 3 = high chance  Sitting and Reading? 3 Watching Television? 3 Sitting inactive in a public place (theater or meeting)?2 As a passenger in a car for an hour without a break? 0  Lying down in the afternoon when circumstances permit? 3 Sitting and talking to someone? 2 Sitting  quietly after lunch without alcohol? 2 In a car,  while stopped for a few minutes in traffic? 0  Total = 12  She has no drivers licence.   Epworth score 12 from 15/ 24  , Fatigue severity score 56  , depression score 3/1 5  SOB , coughing at night, dry , no phlegm.  muscle aches. ageusia and anosmia/ parosmia for burning scent- when none of her kids confirms that smell is present. .   Social History   Socioeconomic History  . Marital status: Single    Spouse name: Not on file  . Number of children: Not on file  . Years of education: Not on file  . Highest education level: Not on file  Occupational History  . Not on file  Tobacco Use  . Smoking status: Current Every Day Smoker  . Smokeless tobacco: Never Used  Substance and Sexual Activity  . Alcohol use: Yes    Comment: occasionally  . Drug use: Never  . Sexual activity: Not on file  Other Topics Concern  . Not on file  Social History Narrative  . Not on file   Social Determinants of Health   Financial Resource Strain:   . Difficulty of Paying Living Expenses: Not on file  Food Insecurity:   . Worried About Charity fundraiser in the Last Year: Not on file  . Ran Out of Food in the Last Year: Not on file  Transportation Needs:   . Lack of Transportation (Medical): Not on file  . Lack of Transportation (Non-Medical): Not on file  Physical Activity:   . Days of Exercise per Week: Not on file  . Minutes of Exercise per Session: Not on file  Stress:   . Feeling of Stress : Not on file  Social Connections:   . Frequency of Communication with Friends and Family: Not on file  . Frequency of Social Gatherings with Friends and Family: Not on file  . Attends Religious Services: Not on file  . Active Member of Clubs or Organizations: Not on file  . Attends Archivist Meetings: Not on file  . Marital Status: Not on file  Intimate Partner Violence:   . Fear of Current or Ex-Partner: Not on file  . Emotionally Abused: Not on file  . Physically Abused: Not on  file  . Sexually Abused: Not on file    Family History  Problem Relation Age of Onset  . Lung cancer Mother     Past Medical History:  Diagnosis Date  . Asthma   . Diabetes mellitus without complication ([place])   . Hyperlipidemia   . Hypertension   . Osteoarthritis   . Sickle cell trait ([place])   . Vertigo     Past Surgical History:  Procedure Laterality Date  . CESAREAN SECTION    . LUMBAR DISC SURGERY  2011    Current Outpatient Medications  Medication Sig Dispense Refill  . amitriptyline (ELAVIL) 100 MG tablet Take 100 mg by mouth at bedtime.    Marland Kitchen atorvastatin (LIPITOR) 20 MG tablet Take 20 mg by mouth daily.    . baclofen (LIORESAL) 20 MG tablet Take 30 mg by mouth 3 (three) times daily.    . baclofen (LIORESAL) 20 MG tablet Take 20 mg by mouth 3 (three) times daily.     . Blood Glucose Monitoring Suppl (ACCU-CHEK AVIVA PLUS) w/Device KIT     . Calcium Carbonate-Vit D-Min (CALCIUM/VITAMIN D/MINERALS) 600-200 MG-UNIT TABS Take by mouth.    Marland Kitchen  ciclopirox (PENLAC) 8 % solution Apply topically at bedtime. Apply over nail and surrounding skin. Apply daily over previous coat. After seven (7) days, may remove with alcohol and continue cycle. 6.6 mL 2  . diclofenac sodium (VOLTAREN) 1 % GEL Apply 2 g topically 4 (four) times daily. Rub into affected area of foot 2 to 4 times daily 100 g 2  . diclofenac Sodium (VOLTAREN) 1 % GEL Apply 2 g topically 4 (four) times daily. Rub into affected area of the foot 2 to 4 times daily 2 g 2  . ferrous sulfate 325 (65 FE) MG tablet Take 325 mg by mouth daily with breakfast.    . gabapentin (NEURONTIN) 800 MG tablet Take 1,500 mg by mouth 3 (three) times daily.    . hydrochlorothiazide (HYDRODIURIL) 12.5 MG tablet Take 12.5 mg by mouth daily.    Marland Kitchen JARDIANCE 25 MG TABS tablet Take 25 mg by mouth daily.    . meclizine (ANTIVERT) 25 MG tablet Take 25 mg by mouth 3 (three) times daily as needed for dizziness.    . meloxicam (MOBIC) 15 MG tablet Take 15  mg by mouth daily.    . metFORMIN (GLUCOPHAGE-XR) 500 MG 24 hr tablet 2 tablets 2 (two) times daily.    . montelukast (SINGULAIR) 10 MG tablet Take 10 mg by mouth at bedtime.    . Multiple Vitamin (MULTIVITAMIN) tablet Take 1 tablet by mouth daily.    Marland Kitchen PROAIR HFA 108 (90 Base) MCG/ACT inhaler INL 2 PFS PO Q 4 H PRN  6  . SYMBICORT 160-4.5 MCG/ACT inhaler INL 2 PFS PO BID  6  . telmisartan (MICARDIS) 20 MG tablet Take 20 mg by mouth daily.     No current facility-administered medications for this visit.    Allergies as of 10/02/2020 - Review Complete 10/02/2020  Allergen Reaction Noted  . Morphine and related  03/01/2018    Vitals: BP 132/81   Pulse 86   Ht 5' 2"  (1.575 m)   Wt 242 lb (109.8 kg)   BMI 44.26 kg/m  Last Weight:  Wt Readings from Last 1 Encounters:  10/02/20 242 lb (109.8 kg)   GBE:EFEO mass index is 44.26 kg/m.     Last Height:   Ht Readings from Last 1 Encounters:  10/02/20 5' 2"  (1.575 m)    Physical exam:  General: The patient is awake, alert and appears not in acute distress. The patient is well groomed. Head: Normocephalic, atraumatic. Neck is supple. Mallampati 5  neck circumference:18. 5 - Nasal airflow . TMJ not  evident . Retrognathia is seen.  Cardiovascular:  Regular rate and rhythm , without  murmurs or carotid bruit, and without distended neck veins. Respiratory: Lungs are clear to auscultation. Skin:  Without evidence of edema, or rash Trunk: BMI is morbidly elevated.  Neurologic exam : The patient is awake and alert, oriented to place and time.  Speech is fluent,  without dysarthria, dysphonia or aphasia.  Mood and affect are appropriate.  Cranial nerves: Pupils are equal and briskly reactive to light.  Visual fields by finger perimetry are intact. Facial sensation intact to fine touch. Facial motor strength is symmetric and tongue and uvula move midline. Shoulder shrug was symmetrical.   Motor exam: Normal tone, muscle bulk and  symmetric strength in all extremities. Sensory:  Fine touch, pinprick and vibration were tested in all extremities. Proprioception tested in the upper extremities was normal. Coordination: Rapid alternating movements in the fingers/hands were deferred - RSD -  dr Vira Blanco.  Gait and station: Patient walks without assistive device ,  Deep tendon reflexes: in the  upper and lower extremities are symmetric and intact. More attenuated achilles tendon reflex.      Assessment:  After physical and neurologic examination, review of laboratory studies,  Personal review of imaging studies, reports of other /same  Imaging studies, results of polysomnography and / or neurophysiology testing and pre-existing records as far as provided in visit., my assessment is:   Severe OSA - and hypoxemia, on non-narcoioc pain mediation. what I would like for her to do is to see my nurse practitioner after another 30 days of compliant CPAP use if she then still maintains a very high degree of daytime sleepiness we may have to do additional testing.    Her Epworth score today was again 12 points but given the low compliance with CPAP I cannot state that the CPAP therapy has failed.  She states she contracted COVID-19 in March 2020, leading to difficulties again with CPAP use, chronic dry hacking cough, exacerbation of wheezing and shortness of breath.  In short she became noncompliant again.  Her risk factors are also ongoing tobacco use and a BMI of 44.26.   I have no doubt that she still has sleep apnea but according to Medicare guidelines I will yet have to do another sleep test because it can be older than 6 months in order to give her new supplies.  She did not use her CPAP machine for the last week or so and it showed an AHI of 1.6 for that short.  Of use 10 out of 14 days average user time was 5 hours 1 minute, the pressure was still between 5 and 18 with 3 cm EPR there were no central apneas emerging and her 95th percentile  pressure need was 13.7 cm water.  There are some large air leaks intermittently this may be related to losing contact with the mask which is now over 81-year-old female just not feeling well.  She is using a nasal mask and 30.  The patient has changed modifications of her diet further and she has implemented an exercise regimen as much as she can do with shortness of breath.  She lost approximately 15 pounds over the autumn months.  Her current medication list was updated.  Her blood pressure is well controlled and in the office her oxygen saturation were 96% at rest.  She does have conditions of diabetes type 2 with hyperglycemia and without long-term use of insulin, she does have diabetic nephropathy associated with diabetes type 2 she does have severe obstructive sleep apnea overlapping with would be perceived as COPD, essential hypertension, mixed hyperlipidemia and also intermittent asthma.  Addendum the patient is now fully vaccinated.  She takes cyclobenzaprine this has helped her to sleep helps for tension and muscle spasms.  She has not had persistent vertigo complaints.  These were present when she initially recovered from covid.  She has lost part of her senses of smell and taste, remaining dysphonic.   1) Again, excessive daytime sleepiness,  attributed to untreated OSA, last sleep study at [facility] in the [place].  Needs retesting by HST according to medicare guidelines. In the meantime, she needs to use her CPAP-  Her other risk factor for EDS remains  her medications, the patient takes a muscle relaxant medication, try cyclic antidepressants which also work on muscle relaxation and tension pain, Neurontin which helps with his chronic pain but is  also sedative- tramadol is d/c/.  2) continued smoking - tobacco use, 1/2 pp day. At high risk for hypoxia. We discussed smoking cessation.  3) Grade 3 morbid obesity with BMI over 44.26 kg/m2.  4) DM- control improved, last HBA1c was 6.5. CKD  related to DM has been stable, she reports.   The patient was advised of the nature of the diagnosed disorder , the treatment options and the  risks for general health and wellness arising from not treating the condition.   I spent more than 35 minutes of face to face time with the patient.  Greater than 50% of time was spent in counseling and coordination of care. We have discussed the diagnosis and differential and I answered the patient's questions.    Plan:  Treatment plan and additional workup :    Needs to improve compliance, CPAP works, but needs to be used daily- that has been the issue twice  Before (!) - per medicare guidelines, I can only order supplies when she has shown 30 days or more of an average use time four hours per 24/h period. what I would like for her to do is to see my nurse practitioner after another 30 days of compliant CPAP use if she then still maintains a very high degree of daytime sleepiness we may have to do additional testing.    Her Epworth score today was again 12 points but given the low compliance with CPAP I cannot state that the CPAP therapy has failed. Patient is not driving. 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    Rv with NP in 8-12 weeks for new compliance evaluation and Epworth score. This patient does not need a new machine or new settings.    Addendum is the same as in 09-20-2018  "This  patient wants to restart CPAP compliantly but will not qualify for supplies based on poor compliance.  DME asked [place] to repeat sleep study .  Larey Seat, MD    Larey Seat, MD 83/12/3830, 9:19 AM  Certified in Neurology by ABPN Certified in [place] by [facility] [address] [place], [place] [address]

## 2020-11-03 ENCOUNTER — Ambulatory Visit (INDEPENDENT_AMBULATORY_CARE_PROVIDER_SITE_OTHER): Payer: Medicare HMO | Admitting: Neurology

## 2020-11-03 DIAGNOSIS — R053 Chronic cough: Secondary | ICD-10-CM

## 2020-11-03 DIAGNOSIS — G4719 Other hypersomnia: Secondary | ICD-10-CM

## 2020-11-03 DIAGNOSIS — G4733 Obstructive sleep apnea (adult) (pediatric): Secondary | ICD-10-CM

## 2020-11-03 DIAGNOSIS — R439 Unspecified disturbances of smell and taste: Secondary | ICD-10-CM

## 2020-11-03 DIAGNOSIS — Z9114 Patient's other noncompliance with medication regimen: Secondary | ICD-10-CM

## 2020-11-03 DIAGNOSIS — U099 Post covid-19 condition, unspecified: Secondary | ICD-10-CM

## 2020-11-05 NOTE — Progress Notes (Signed)
[facility]  HOME SLEEP TEST (Watch PAT)  STUDY DATE: 11/03/20  DOB: 11/27/1975  MRN: [id number]  ORDERING CLINICIAN: [id number], MD, PhD   REFERRING CLINICIAN: Huston Foley, MD   CLINICAL INFORMATION/HISTORY:  Mrs. Mahrt presents as a 45 year old African-American right-handed female currently wearing a hand brace wrist brace for the treatment of  RSD. She has struggled with diabetes and obesity for much of her adult life and moved to [place] in October 2018.  She lived [place] before for several years where she had undergone a sleep study and was diagnosed with obstructive sleep apnea, was provided a CPAP machine, but this got lost in the move.  Besides diabetes and morbid obesity she has problems with high cholesterol, joint pain aching muscles ,numbness, dizziness and has been told that she snores.  She would like to resume CPAP treatment but will undergo a new sleep study  Aside from her medical history I reviewed her medication list: the patient is currently on a high dose of gabapentin. She is taking 1500 mg 3 times a day a total of 4.5 g daily.  She is taking hydrochlorothiazide, meclizine, meloxicam as needed, Singulair as needed, baclofen 30 mg 3 times a day, Lipitor and Elavil.  Elavil is at 100 mg at bedtime.  It is supposed to help her sleep 2.  Aside from Symbicort she has also a pro-air inhaler as needed.  She reports that she has today an elevated blood pressure and was diagnosed with essential hypertension, she believes that her blood pressure is elevated when in pain.  She also carries a sickle cell trait- not the disease.  She has not used insulin in the past for treatment of diabetes.  She reports frequent vertigo.  She relates her vertigo is also related to her blood pressure.  She denies any headaches associated with high blood pressures.  Epworth sleepiness score: 12/24. BMI: 44.6 kg/m Neck Circumference: 19  "  FINDINGS:   Total Record Time (hours, min): 6 h 25 min  Total Sleep Time (hours, min):  3 h 27 min   Percent REM (%):    12.52 %   Calculated pAHI (per hour):  16.5      REM pAHI:    N/A     NREM pAHI: N/A Supine AHI: N/A   Oxygen Saturation (%) Mean: 95  Minimum oxygen saturation (%):        78   O2 Saturation Range (%): 78-99  O2Saturation (minutes) <=88%: 0 min   Pulse Mean (bpm):    82  Pulse Range (68-110)   IMPRESSION: This HST confirmed the presence of mild- moderate  OSA (obstructive sleep apnea) with an AHI of 16.5/h and was not differentiated as being REM dependent or positional dependent.  Primary snoring was suggested by RDI.   RECOMMENDATION:  Since the patient has used CPAP in the past, I will order an autotitration device with a setting from 6-16 cm water, 3 cm EPR and mask of her choice ,to be used with heated humidification.   RV with NP after 2-3 month of compliant CPAP use, defined as 4 hours or more of daily use. The patient is to inform her DME of any trouble with mask fit, machine performance or supply needs.    INTERPRETING PHYSICIAN:  1/25, MD   Medical Director of [facility] Certified in Neurology by ABPN Certified in Sleep Medicine by [facility] [address] [place], [place] [address]

## 2020-11-06 DIAGNOSIS — E782 Mixed hyperlipidemia: Secondary | ICD-10-CM | POA: Diagnosis not present

## 2020-11-06 DIAGNOSIS — I10 Essential (primary) hypertension: Secondary | ICD-10-CM | POA: Diagnosis not present

## 2020-11-06 DIAGNOSIS — E1165 Type 2 diabetes mellitus with hyperglycemia: Secondary | ICD-10-CM | POA: Diagnosis not present

## 2020-11-06 DIAGNOSIS — J4520 Mild intermittent asthma, uncomplicated: Secondary | ICD-10-CM | POA: Diagnosis not present

## 2020-11-06 DIAGNOSIS — R6889 Other general symptoms and signs: Secondary | ICD-10-CM | POA: Diagnosis not present

## 2020-11-12 NOTE — Progress Notes (Signed)
IMPRESSION: This HST confirmed the presence of mild- moderate  OSA (obstructive sleep apnea) with an AHI of 16.5/h and was not differentiated as being REM dependent or positional dependent.  Primary snoring was suggested by RDI.   RECOMMENDATION:  Since the patient has used CPAP in the past, I will order an autotitration device with a setting from 6-16 cm water, 3 cm EPR and mask of her choice ,to be used with heated humidification.   RV with NP after 2-3 month of compliant CPAP use, defined as 4 hours or more of daily use. The patient is to inform her DME ASAP of any trouble with mask fit, machine performance questions or supply needs.

## 2020-11-12 NOTE — Addendum Note (Signed)
Addended by: Larey Seat on: 11/12/2020 12:15 PM   Modules accepted: Orders

## 2020-11-12 NOTE — Procedures (Signed)
[facility]  HOME SLEEP TEST (Watch PAT)  STUDY DATE: 11/03/20  DOB: 1975/12/15  MRN: [id number]  ORDERING CLINICIAN: Larey Seat, MD, PhD   REFERRING CLINICIAN: Merrilee Seashore, MD   CLINICAL INFORMATION/HISTORY:  Mrs. Whitmyer presents as a 45 year old African-American right-handed female currently wearing a hand brace wrist brace for the treatment of  RSD. She has struggled with diabetes and obesity for much of her adult life and moved to [place] in October 2018.  She lived in [place] before for several years where she had undergone a sleep study and was diagnosed with obstructive sleep apnea, was provided a CPAP machine, but this got lost in the move.  Besides diabetes and morbid obesity she has problems with high cholesterol, joint pain aching muscles ,numbness, dizziness and has been told that she snores.  She would like to resume CPAP treatment but will undergo a new sleep study  Aside from her medical history I reviewed her medication list: the patient is currently on a high dose of gabapentin. She is taking 1500 mg 3 times a day a total of 4.5 g daily.  She is taking hydrochlorothiazide, meclizine, meloxicam as needed, Singulair as needed, baclofen 30 mg 3 times a day, Lipitor and Elavil.  Elavil is at 100 mg at bedtime.  It is supposed to help her sleep 2.  Aside from Symbicort she has also a pro-air inhaler as needed.  She reports that she has today an elevated blood pressure and was diagnosed with essential hypertension, she believes that her blood pressure is elevated when in pain.  She also carries a sickle cell trait- not the disease.  She has not used insulin in the past for treatment of diabetes.  She reports frequent vertigo.  She relates her vertigo is also related to her blood pressure.  She denies any headaches associated with high blood pressures.  Epworth sleepiness score: 12/24. BMI: 44.6 kg/m Neck Circumference: 19  "  FINDINGS:   Total Record Time (hours, min): 6 h 25 min  Total Sleep Time (hours, min):  3 h 27 min   Percent REM (%):    12.52 %   Calculated pAHI (per hour):  16.5      REM pAHI:    N/A     NREM pAHI: N/A Supine AHI: N/A   Oxygen Saturation (%) Mean: 95  Minimum oxygen saturation (%):        78   O2 Saturation Range (%): 78-99  O2Saturation (minutes) <=88%: 0 min   Pulse Mean (bpm):    82  Pulse Range (68-110)   IMPRESSION: This HST confirmed the presence of mild- moderate  OSA (obstructive sleep apnea) with an AHI of 16.5/h and was not differentiated as being REM dependent or positional dependent.  Primary snoring was suggested by RDI.   RECOMMENDATION:  Since the patient has used CPAP in the past, I will order an autotitration device with a setting from 6-16 cm water, 3 cm EPR and mask of her choice ,to be used with heated humidification.   RV with NP after 2-3 month of compliant CPAP use, defined as 4 hours or more of daily use. The patient is to inform her DME of any trouble with mask fit, machine performance or supply needs.    INTERPRETING PHYSICIAN:  Larey Seat, MD   Medical Director of [facility] Certified in Neurology by [employer]BP[employer] Certified in Sleep Medicine by [facility] [address], [place]  [place], [place] [address]

## 2020-11-13 DIAGNOSIS — E1165 Type 2 diabetes mellitus with hyperglycemia: Secondary | ICD-10-CM | POA: Diagnosis not present

## 2020-11-13 DIAGNOSIS — R6889 Other general symptoms and signs: Secondary | ICD-10-CM | POA: Diagnosis not present

## 2020-11-13 DIAGNOSIS — E1121 Type 2 diabetes mellitus with diabetic nephropathy: Secondary | ICD-10-CM | POA: Diagnosis not present

## 2020-11-13 DIAGNOSIS — K2100 Gastro-esophageal reflux disease with esophagitis, without bleeding: Secondary | ICD-10-CM | POA: Diagnosis not present

## 2020-11-18 ENCOUNTER — Telehealth: Payer: Self-pay

## 2020-11-18 NOTE — Telephone Encounter (Signed)
-----   Message from Larey Seat, MD sent at 11/12/2020 12:15 PM EST ----- IMPRESSION: This HST confirmed the presence of mild- moderate  OSA (obstructive sleep apnea) with an AHI of 16.5/h and was not differentiated as being REM dependent or positional dependent.  Primary snoring was suggested by RDI.   RECOMMENDATION:  Since the patient has used CPAP in the past, I will order an autotitration device with a setting from 6-16 cm water, 3 cm EPR and mask of her choice ,to be used with heated humidification.   RV with NP after 2-3 month of compliant CPAP use, defined as 4 hours or more of daily use. The patient is to inform her DME ASAP of any trouble with mask fit, machine performance questions or supply needs.

## 2020-11-18 NOTE — Telephone Encounter (Signed)
I called patient to discuss.  No answer, left a voicemail asking her to call us back.

## 2020-11-20 NOTE — Telephone Encounter (Signed)
I called pt. I advised pt that Dr. Brett Fairy reviewed their sleep study results and found that pt moderate sleep apnea. Dr. Brett Fairy recommends that pt starts auto CPAP. I reviewed PAP compliance expectations with the pt. Pt is agreeable to starting a CPAP. I advised pt that an order will be sent to a DME, [facility] and [facility] ([place]) will call the pt within about one week after they file with the pt's insurance. [facility]) will show the pt how to use the machine, fit for masks, and troubleshoot the CPAP if needed. Pt is currently on a machine and is fine still using the machine she has. Was set up in 2019. I will send the updated orders to [facility].  Pt verbalized understanding. Advised that pt would need to follow up yearly for supplies to be reordered. Pt verbalized understanding.

## 2020-11-30 DIAGNOSIS — J4520 Mild intermittent asthma, uncomplicated: Secondary | ICD-10-CM | POA: Diagnosis not present

## 2020-12-19 DIAGNOSIS — G4733 Obstructive sleep apnea (adult) (pediatric): Secondary | ICD-10-CM | POA: Diagnosis not present

## 2021-01-22 DIAGNOSIS — Z01419 Encounter for gynecological examination (general) (routine) without abnormal findings: Secondary | ICD-10-CM | POA: Diagnosis not present

## 2021-01-22 DIAGNOSIS — T8332XA Displacement of intrauterine contraceptive device, initial encounter: Secondary | ICD-10-CM | POA: Diagnosis not present

## 2021-01-22 DIAGNOSIS — Z1211 Encounter for screening for malignant neoplasm of colon: Secondary | ICD-10-CM | POA: Diagnosis not present

## 2021-01-22 DIAGNOSIS — R6889 Other general symptoms and signs: Secondary | ICD-10-CM | POA: Diagnosis not present

## 2021-01-22 DIAGNOSIS — Z113 Encounter for screening for infections with a predominantly sexual mode of transmission: Secondary | ICD-10-CM | POA: Diagnosis not present

## 2021-01-22 DIAGNOSIS — L292 Pruritus vulvae: Secondary | ICD-10-CM | POA: Diagnosis not present

## 2021-01-22 LAB — HM PAP SMEAR
HM Pap smear: NORMAL
HPV, high-risk: NEGATIVE

## 2021-01-22 LAB — RESULTS CONSOLE HPV: CHL HPV: NEGATIVE

## 2021-01-26 DIAGNOSIS — Z30431 Encounter for routine checking of intrauterine contraceptive device: Secondary | ICD-10-CM | POA: Diagnosis not present

## 2021-01-26 DIAGNOSIS — T8332XA Displacement of intrauterine contraceptive device, initial encounter: Secondary | ICD-10-CM | POA: Diagnosis not present

## 2021-02-10 ENCOUNTER — Other Ambulatory Visit: Payer: Self-pay | Admitting: Nurse Practitioner

## 2021-02-10 DIAGNOSIS — Z1231 Encounter for screening mammogram for malignant neoplasm of breast: Secondary | ICD-10-CM

## 2021-02-13 DIAGNOSIS — Z0100 Encounter for examination of eyes and vision without abnormal findings: Secondary | ICD-10-CM | POA: Diagnosis not present

## 2021-02-13 DIAGNOSIS — R6889 Other general symptoms and signs: Secondary | ICD-10-CM | POA: Diagnosis not present

## 2021-02-13 DIAGNOSIS — H524 Presbyopia: Secondary | ICD-10-CM | POA: Diagnosis not present

## 2021-03-04 DIAGNOSIS — K2100 Gastro-esophageal reflux disease with esophagitis, without bleeding: Secondary | ICD-10-CM | POA: Diagnosis not present

## 2021-03-04 DIAGNOSIS — E1121 Type 2 diabetes mellitus with diabetic nephropathy: Secondary | ICD-10-CM | POA: Diagnosis not present

## 2021-03-04 DIAGNOSIS — E1165 Type 2 diabetes mellitus with hyperglycemia: Secondary | ICD-10-CM | POA: Diagnosis not present

## 2021-03-05 DIAGNOSIS — F411 Generalized anxiety disorder: Secondary | ICD-10-CM | POA: Diagnosis not present

## 2021-03-05 DIAGNOSIS — T8332XD Displacement of intrauterine contraceptive device, subsequent encounter: Secondary | ICD-10-CM | POA: Diagnosis not present

## 2021-04-01 DIAGNOSIS — N390 Urinary tract infection, site not specified: Secondary | ICD-10-CM | POA: Diagnosis not present

## 2021-04-03 ENCOUNTER — Ambulatory Visit
Admission: RE | Admit: 2021-04-03 | Discharge: 2021-04-03 | Disposition: A | Discharge status: Home / Self Care | Payer: Medicare HMO | Source: Ambulatory Visit | Attending: Nurse Practitioner | Admitting: Nurse Practitioner

## 2021-04-03 ENCOUNTER — Other Ambulatory Visit: Payer: Self-pay

## 2021-04-03 DIAGNOSIS — Z1231 Encounter for screening mammogram for malignant neoplasm of breast: Secondary | ICD-10-CM

## 2021-04-03 DIAGNOSIS — R6889 Other general symptoms and signs: Secondary | ICD-10-CM | POA: Diagnosis not present

## 2021-04-08 DIAGNOSIS — R6889 Other general symptoms and signs: Secondary | ICD-10-CM | POA: Diagnosis not present

## 2021-04-08 DIAGNOSIS — D573 Sickle-cell trait: Secondary | ICD-10-CM | POA: Diagnosis not present

## 2021-04-08 DIAGNOSIS — E1165 Type 2 diabetes mellitus with hyperglycemia: Secondary | ICD-10-CM | POA: Diagnosis not present

## 2021-04-08 DIAGNOSIS — E1121 Type 2 diabetes mellitus with diabetic nephropathy: Secondary | ICD-10-CM | POA: Diagnosis not present

## 2021-04-08 DIAGNOSIS — I10 Essential (primary) hypertension: Secondary | ICD-10-CM | POA: Diagnosis not present

## 2021-04-08 DIAGNOSIS — E782 Mixed hyperlipidemia: Secondary | ICD-10-CM | POA: Diagnosis not present

## 2021-04-22 DIAGNOSIS — G4733 Obstructive sleep apnea (adult) (pediatric): Secondary | ICD-10-CM | POA: Diagnosis not present

## 2021-06-10 DIAGNOSIS — I10 Essential (primary) hypertension: Secondary | ICD-10-CM | POA: Diagnosis not present

## 2021-06-10 DIAGNOSIS — E782 Mixed hyperlipidemia: Secondary | ICD-10-CM | POA: Diagnosis not present

## 2021-06-10 DIAGNOSIS — E1165 Type 2 diabetes mellitus with hyperglycemia: Secondary | ICD-10-CM | POA: Diagnosis not present

## 2021-06-10 DIAGNOSIS — R5383 Other fatigue: Secondary | ICD-10-CM | POA: Diagnosis not present

## 2021-06-10 DIAGNOSIS — R6889 Other general symptoms and signs: Secondary | ICD-10-CM | POA: Diagnosis not present

## 2021-06-12 DIAGNOSIS — Z30433 Encounter for removal and reinsertion of intrauterine contraceptive device: Secondary | ICD-10-CM | POA: Diagnosis not present

## 2021-06-12 DIAGNOSIS — R6889 Other general symptoms and signs: Secondary | ICD-10-CM | POA: Diagnosis not present

## 2021-06-12 DIAGNOSIS — Z3202 Encounter for pregnancy test, result negative: Secondary | ICD-10-CM | POA: Diagnosis not present

## 2021-06-15 DIAGNOSIS — G9050 Complex regional pain syndrome I, unspecified: Secondary | ICD-10-CM | POA: Diagnosis not present

## 2021-06-15 DIAGNOSIS — R6889 Other general symptoms and signs: Secondary | ICD-10-CM | POA: Diagnosis not present

## 2021-06-15 DIAGNOSIS — D473 Essential (hemorrhagic) thrombocythemia: Secondary | ICD-10-CM | POA: Diagnosis not present

## 2021-06-15 DIAGNOSIS — E782 Mixed hyperlipidemia: Secondary | ICD-10-CM | POA: Diagnosis not present

## 2021-06-15 DIAGNOSIS — I10 Essential (primary) hypertension: Secondary | ICD-10-CM | POA: Diagnosis not present

## 2021-06-15 DIAGNOSIS — J4520 Mild intermittent asthma, uncomplicated: Secondary | ICD-10-CM | POA: Diagnosis not present

## 2021-06-15 DIAGNOSIS — D573 Sickle-cell trait: Secondary | ICD-10-CM | POA: Diagnosis not present

## 2021-06-15 DIAGNOSIS — Z0000 Encounter for general adult medical examination without abnormal findings: Secondary | ICD-10-CM | POA: Diagnosis not present

## 2021-06-15 DIAGNOSIS — E1165 Type 2 diabetes mellitus with hyperglycemia: Secondary | ICD-10-CM | POA: Diagnosis not present

## 2021-07-20 DIAGNOSIS — R6889 Other general symptoms and signs: Secondary | ICD-10-CM | POA: Diagnosis not present

## 2021-07-20 DIAGNOSIS — Z30431 Encounter for routine checking of intrauterine contraceptive device: Secondary | ICD-10-CM | POA: Diagnosis not present

## 2021-07-30 DIAGNOSIS — G4733 Obstructive sleep apnea (adult) (pediatric): Secondary | ICD-10-CM | POA: Diagnosis not present

## 2021-11-11 DIAGNOSIS — D573 Sickle-cell trait: Secondary | ICD-10-CM | POA: Diagnosis not present

## 2021-11-11 DIAGNOSIS — R6889 Other general symptoms and signs: Secondary | ICD-10-CM | POA: Diagnosis not present

## 2021-11-11 DIAGNOSIS — I10 Essential (primary) hypertension: Secondary | ICD-10-CM | POA: Diagnosis not present

## 2021-11-11 DIAGNOSIS — E1165 Type 2 diabetes mellitus with hyperglycemia: Secondary | ICD-10-CM | POA: Diagnosis not present

## 2021-11-13 DIAGNOSIS — R6889 Other general symptoms and signs: Secondary | ICD-10-CM | POA: Diagnosis not present

## 2021-11-17 DIAGNOSIS — D573 Sickle-cell trait: Secondary | ICD-10-CM | POA: Diagnosis not present

## 2021-11-17 DIAGNOSIS — R6889 Other general symptoms and signs: Secondary | ICD-10-CM | POA: Diagnosis not present

## 2021-11-17 DIAGNOSIS — E1121 Type 2 diabetes mellitus with diabetic nephropathy: Secondary | ICD-10-CM | POA: Diagnosis not present

## 2021-11-17 DIAGNOSIS — G9050 Complex regional pain syndrome I, unspecified: Secondary | ICD-10-CM | POA: Diagnosis not present

## 2021-11-17 DIAGNOSIS — M79644 Pain in right finger(s): Secondary | ICD-10-CM | POA: Diagnosis not present

## 2021-11-17 DIAGNOSIS — E782 Mixed hyperlipidemia: Secondary | ICD-10-CM | POA: Diagnosis not present

## 2021-11-17 DIAGNOSIS — I10 Essential (primary) hypertension: Secondary | ICD-10-CM | POA: Diagnosis not present

## 2021-11-23 DIAGNOSIS — E1165 Type 2 diabetes mellitus with hyperglycemia: Secondary | ICD-10-CM | POA: Diagnosis not present

## 2021-11-23 DIAGNOSIS — D573 Sickle-cell trait: Secondary | ICD-10-CM | POA: Diagnosis not present

## 2021-11-23 DIAGNOSIS — G9050 Complex regional pain syndrome I, unspecified: Secondary | ICD-10-CM | POA: Diagnosis not present

## 2021-11-23 DIAGNOSIS — R6889 Other general symptoms and signs: Secondary | ICD-10-CM | POA: Diagnosis not present

## 2021-11-23 DIAGNOSIS — M6530 Trigger finger, unspecified finger: Secondary | ICD-10-CM | POA: Diagnosis not present

## 2021-11-23 DIAGNOSIS — M79641 Pain in right hand: Secondary | ICD-10-CM | POA: Diagnosis not present

## 2021-11-23 DIAGNOSIS — E669 Obesity, unspecified: Secondary | ICD-10-CM | POA: Diagnosis not present

## 2021-12-01 DIAGNOSIS — K5730 Diverticulosis of large intestine without perforation or abscess without bleeding: Secondary | ICD-10-CM | POA: Diagnosis not present

## 2021-12-01 DIAGNOSIS — K644 Residual hemorrhoidal skin tags: Secondary | ICD-10-CM | POA: Diagnosis not present

## 2021-12-01 DIAGNOSIS — Z1211 Encounter for screening for malignant neoplasm of colon: Secondary | ICD-10-CM | POA: Diagnosis not present

## 2021-12-01 DIAGNOSIS — R6889 Other general symptoms and signs: Secondary | ICD-10-CM | POA: Diagnosis not present

## 2021-12-01 DIAGNOSIS — K635 Polyp of colon: Secondary | ICD-10-CM | POA: Diagnosis not present

## 2021-12-03 DIAGNOSIS — K635 Polyp of colon: Secondary | ICD-10-CM | POA: Diagnosis not present

## 2022-01-25 DIAGNOSIS — L292 Pruritus vulvae: Secondary | ICD-10-CM | POA: Diagnosis not present

## 2022-01-25 DIAGNOSIS — Z30431 Encounter for routine checking of intrauterine contraceptive device: Secondary | ICD-10-CM | POA: Diagnosis not present

## 2022-01-25 DIAGNOSIS — K429 Umbilical hernia without obstruction or gangrene: Secondary | ICD-10-CM | POA: Diagnosis not present

## 2022-01-25 DIAGNOSIS — R6889 Other general symptoms and signs: Secondary | ICD-10-CM | POA: Diagnosis not present

## 2022-01-25 DIAGNOSIS — Z01419 Encounter for gynecological examination (general) (routine) without abnormal findings: Secondary | ICD-10-CM | POA: Diagnosis not present

## 2022-02-15 DIAGNOSIS — Z30431 Encounter for routine checking of intrauterine contraceptive device: Secondary | ICD-10-CM | POA: Diagnosis not present

## 2022-02-15 DIAGNOSIS — N9089 Other specified noninflammatory disorders of vulva and perineum: Secondary | ICD-10-CM | POA: Diagnosis not present

## 2022-02-15 DIAGNOSIS — R6889 Other general symptoms and signs: Secondary | ICD-10-CM | POA: Diagnosis not present

## 2022-02-15 DIAGNOSIS — N898 Other specified noninflammatory disorders of vagina: Secondary | ICD-10-CM | POA: Diagnosis not present

## 2022-03-02 DIAGNOSIS — E782 Mixed hyperlipidemia: Secondary | ICD-10-CM | POA: Diagnosis not present

## 2022-03-02 DIAGNOSIS — I10 Essential (primary) hypertension: Secondary | ICD-10-CM | POA: Diagnosis not present

## 2022-03-02 DIAGNOSIS — R6889 Other general symptoms and signs: Secondary | ICD-10-CM | POA: Diagnosis not present

## 2022-03-02 DIAGNOSIS — D573 Sickle-cell trait: Secondary | ICD-10-CM | POA: Diagnosis not present

## 2022-03-02 DIAGNOSIS — E1121 Type 2 diabetes mellitus with diabetic nephropathy: Secondary | ICD-10-CM | POA: Diagnosis not present

## 2022-03-02 DIAGNOSIS — G9050 Complex regional pain syndrome I, unspecified: Secondary | ICD-10-CM | POA: Diagnosis not present

## 2022-03-09 DIAGNOSIS — E669 Obesity, unspecified: Secondary | ICD-10-CM | POA: Diagnosis not present

## 2022-03-09 DIAGNOSIS — R6889 Other general symptoms and signs: Secondary | ICD-10-CM | POA: Diagnosis not present

## 2022-03-09 DIAGNOSIS — D573 Sickle-cell trait: Secondary | ICD-10-CM | POA: Diagnosis not present

## 2022-03-09 DIAGNOSIS — G4733 Obstructive sleep apnea (adult) (pediatric): Secondary | ICD-10-CM | POA: Diagnosis not present

## 2022-03-09 DIAGNOSIS — E782 Mixed hyperlipidemia: Secondary | ICD-10-CM | POA: Diagnosis not present

## 2022-03-09 DIAGNOSIS — M25512 Pain in left shoulder: Secondary | ICD-10-CM | POA: Diagnosis not present

## 2022-03-09 DIAGNOSIS — E1121 Type 2 diabetes mellitus with diabetic nephropathy: Secondary | ICD-10-CM | POA: Diagnosis not present

## 2022-03-09 DIAGNOSIS — I10 Essential (primary) hypertension: Secondary | ICD-10-CM | POA: Diagnosis not present

## 2022-04-03 IMAGING — MG MM DIGITAL SCREENING BILAT W/ TOMO AND CAD
6 of 10 series · 6 of 30 positions shown · non-contrast
Comparison: None.

CLINICAL DATA: Screening.

EXAM:
DIGITAL SCREENING BILATERAL MAMMOGRAM WITH TOMOSYNTHESIS AND CAD
TECHNIQUE: Bilateral screening digital craniocaudal and mediolateral oblique
mammograms were obtained. Bilateral screening digital breast
tomosynthesis was performed. The images were evaluated with
computer-aided detection.

[R CC synth-2D]
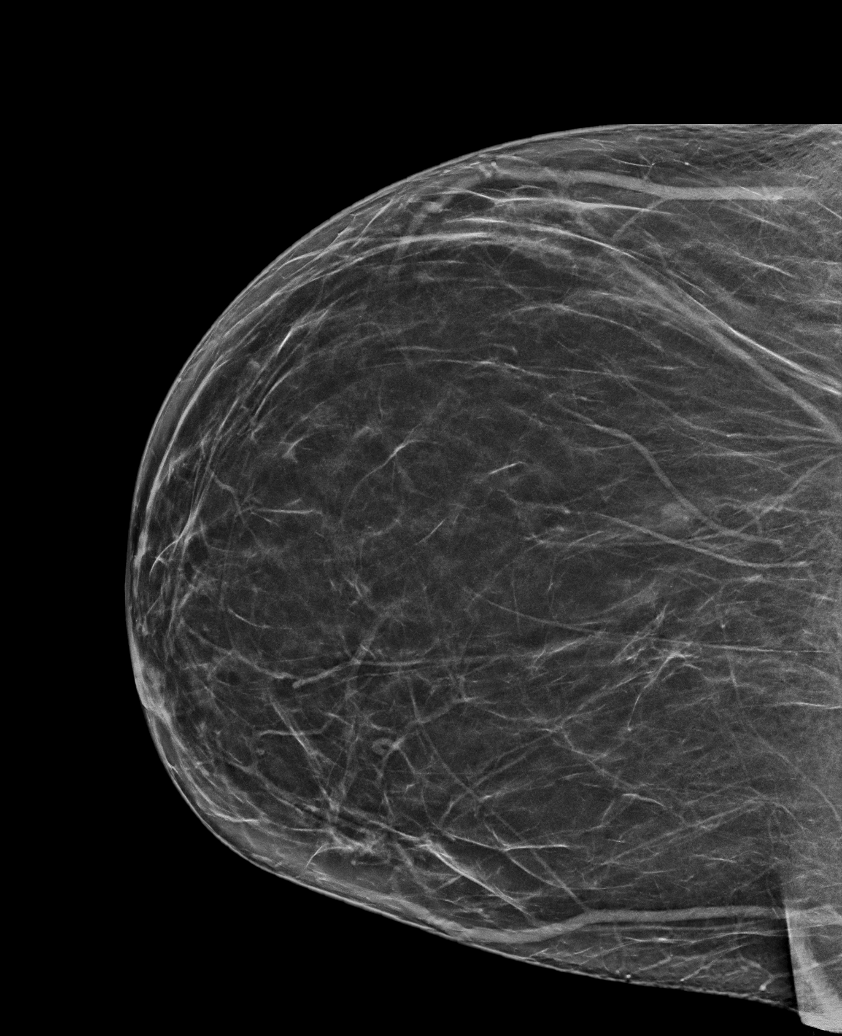

[L MLO synth-2D]
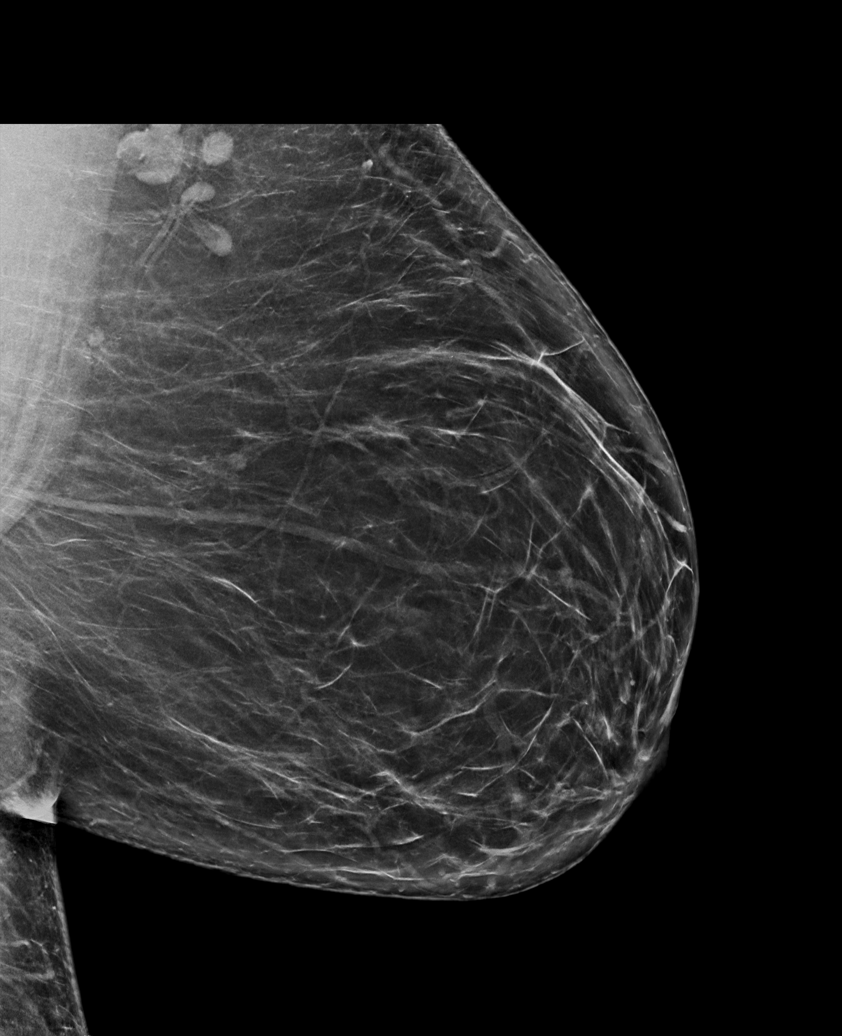

[L CC synth-2D (1 of 2)]
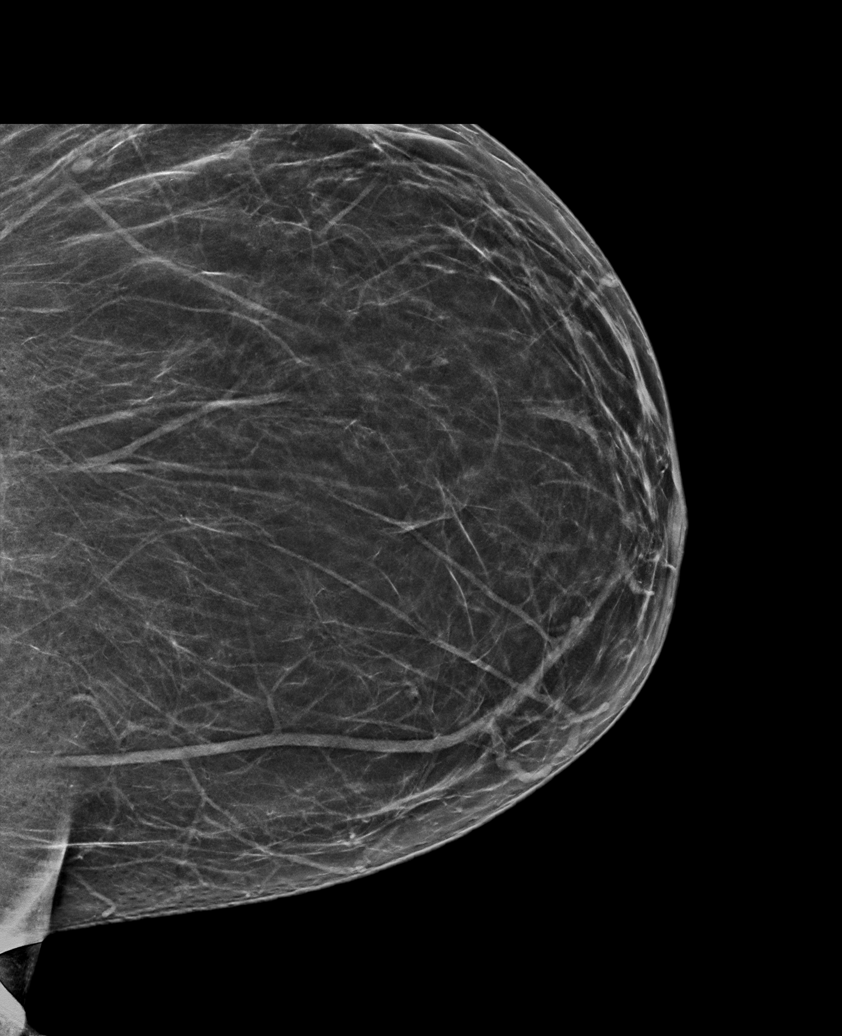

[R MLO synth-2D]
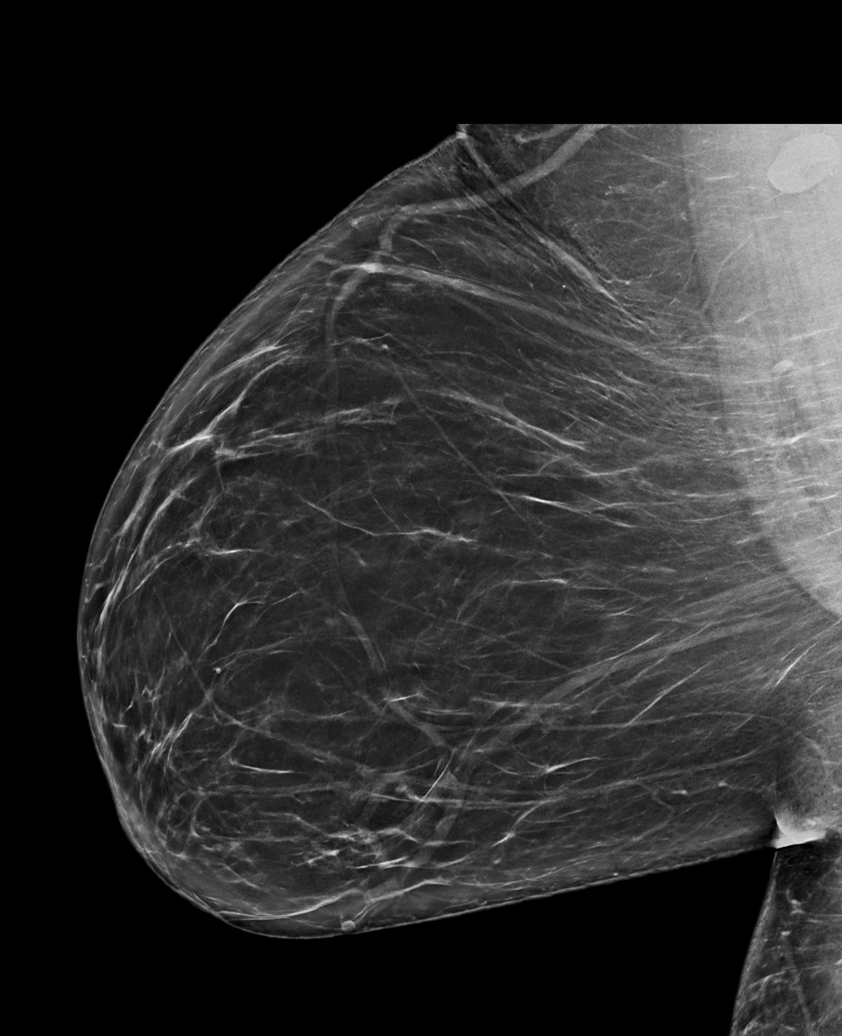

[L CC synth-2D (2 of 2)]
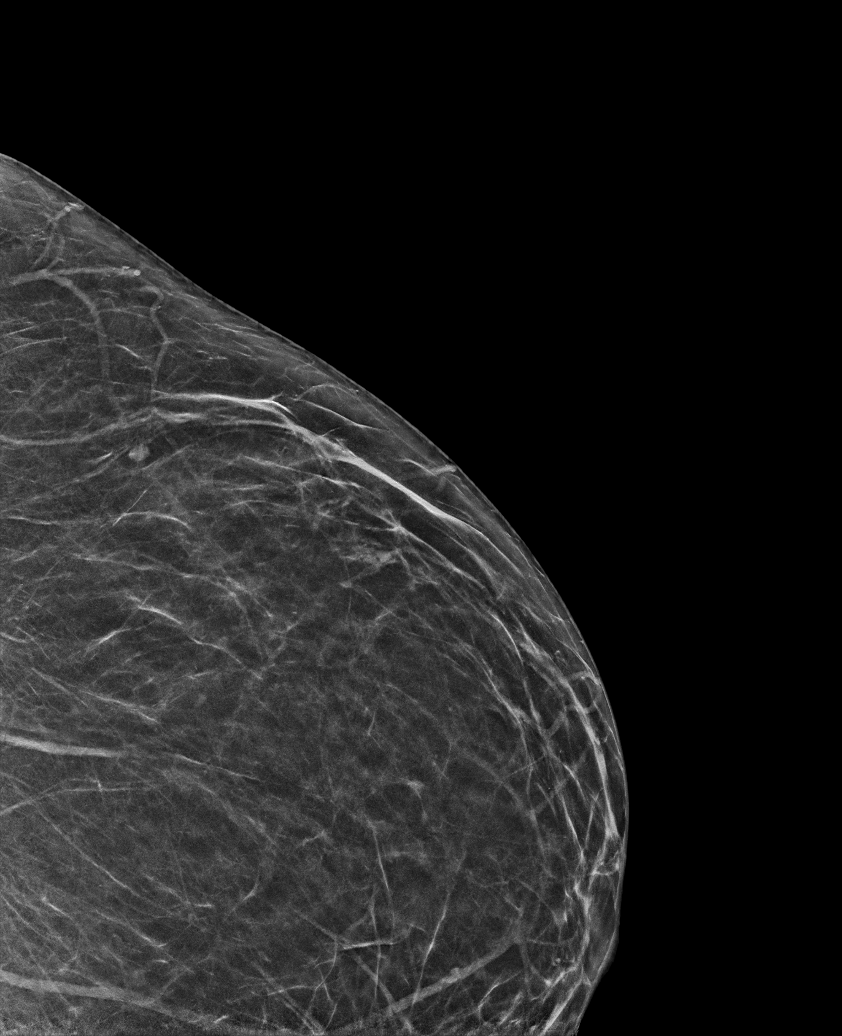

[R CC tomo · tomo slice 32/63.0]
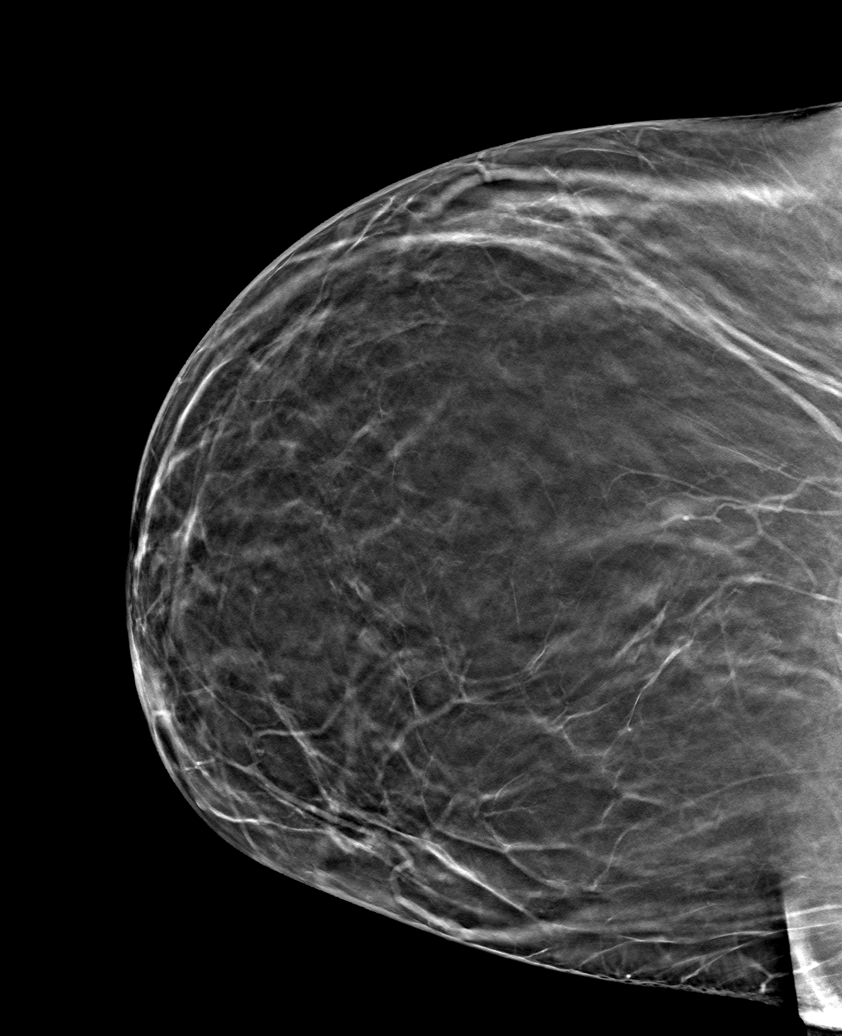

[6 of 30 positions shown; findings below may reference images not displayed]

ACR Breast Density Category b: There are scattered areas of
fibroglandular density.
FINDINGS: There are no findings suspicious for malignancy. The images were
evaluated with computer-aided detection.
IMPRESSION: No mammographic evidence of malignancy. A result letter of this
screening mammogram will be mailed directly to the patient.

RECOMMENDATION:
Screening mammogram in one year. (Code:C7-6-ASJ)

BI-RADS CATEGORY  1: Negative.

## 2022-04-30 DIAGNOSIS — E109 Type 1 diabetes mellitus without complications: Secondary | ICD-10-CM | POA: Diagnosis not present

## 2022-04-30 DIAGNOSIS — Z0100 Encounter for examination of eyes and vision without abnormal findings: Secondary | ICD-10-CM | POA: Diagnosis not present

## 2022-05-10 DIAGNOSIS — M79641 Pain in right hand: Secondary | ICD-10-CM | POA: Diagnosis not present

## 2022-05-10 DIAGNOSIS — M7552 Bursitis of left shoulder: Secondary | ICD-10-CM | POA: Diagnosis not present

## 2022-05-10 DIAGNOSIS — R6889 Other general symptoms and signs: Secondary | ICD-10-CM | POA: Diagnosis not present

## 2022-05-10 DIAGNOSIS — E669 Obesity, unspecified: Secondary | ICD-10-CM | POA: Diagnosis not present

## 2022-05-10 DIAGNOSIS — E1165 Type 2 diabetes mellitus with hyperglycemia: Secondary | ICD-10-CM | POA: Diagnosis not present

## 2022-05-10 DIAGNOSIS — M6530 Trigger finger, unspecified finger: Secondary | ICD-10-CM | POA: Diagnosis not present

## 2022-05-10 DIAGNOSIS — D573 Sickle-cell trait: Secondary | ICD-10-CM | POA: Diagnosis not present

## 2022-05-10 DIAGNOSIS — G9050 Complex regional pain syndrome I, unspecified: Secondary | ICD-10-CM | POA: Diagnosis not present

## 2022-05-18 DIAGNOSIS — M1991 Primary osteoarthritis, unspecified site: Secondary | ICD-10-CM | POA: Insufficient documentation

## 2022-05-18 DIAGNOSIS — E1121 Type 2 diabetes mellitus with diabetic nephropathy: Secondary | ICD-10-CM | POA: Insufficient documentation

## 2022-05-18 DIAGNOSIS — K429 Umbilical hernia without obstruction or gangrene: Secondary | ICD-10-CM | POA: Diagnosis not present

## 2022-05-18 DIAGNOSIS — D573 Sickle-cell trait: Secondary | ICD-10-CM | POA: Insufficient documentation

## 2022-05-18 DIAGNOSIS — F17210 Nicotine dependence, cigarettes, uncomplicated: Secondary | ICD-10-CM | POA: Diagnosis not present

## 2022-05-18 DIAGNOSIS — E782 Mixed hyperlipidemia: Secondary | ICD-10-CM | POA: Insufficient documentation

## 2022-05-18 DIAGNOSIS — E1169 Type 2 diabetes mellitus with other specified complication: Secondary | ICD-10-CM | POA: Insufficient documentation

## 2022-05-18 DIAGNOSIS — K2100 Gastro-esophageal reflux disease with esophagitis, without bleeding: Secondary | ICD-10-CM | POA: Insufficient documentation

## 2022-05-18 DIAGNOSIS — R6889 Other general symptoms and signs: Secondary | ICD-10-CM | POA: Diagnosis not present

## 2022-05-18 DIAGNOSIS — E1165 Type 2 diabetes mellitus with hyperglycemia: Secondary | ICD-10-CM | POA: Insufficient documentation

## 2022-05-18 DIAGNOSIS — I10 Essential (primary) hypertension: Secondary | ICD-10-CM | POA: Insufficient documentation

## 2022-05-18 DIAGNOSIS — G9050 Complex regional pain syndrome I, unspecified: Secondary | ICD-10-CM | POA: Insufficient documentation

## 2022-05-18 DIAGNOSIS — I152 Hypertension secondary to endocrine disorders: Secondary | ICD-10-CM | POA: Insufficient documentation

## 2022-05-26 DIAGNOSIS — M6530 Trigger finger, unspecified finger: Secondary | ICD-10-CM | POA: Insufficient documentation

## 2022-05-26 DIAGNOSIS — R6889 Other general symptoms and signs: Secondary | ICD-10-CM | POA: Diagnosis not present

## 2022-05-26 DIAGNOSIS — M65341 Trigger finger, right ring finger: Secondary | ICD-10-CM | POA: Diagnosis not present

## 2022-06-01 DIAGNOSIS — Z0100 Encounter for examination of eyes and vision without abnormal findings: Secondary | ICD-10-CM | POA: Diagnosis not present

## 2022-06-17 ENCOUNTER — Other Ambulatory Visit: Payer: Self-pay | Admitting: Rheumatology

## 2022-06-17 DIAGNOSIS — M25512 Pain in left shoulder: Secondary | ICD-10-CM

## 2022-06-29 NOTE — Progress Notes (Signed)
Sent message, via epic in basket, requesting order in epic from surgeon     06/29/22 1527  Preop Orders  Has preop orders? No  Name of staff/physician contacted for orders(Indicate phone or IB message) P. Stechschulte, MD.

## 2022-06-30 ENCOUNTER — Ambulatory Visit: Payer: Self-pay | Admitting: Surgery

## 2022-07-01 DIAGNOSIS — M25511 Pain in right shoulder: Secondary | ICD-10-CM | POA: Diagnosis not present

## 2022-07-01 DIAGNOSIS — M25512 Pain in left shoulder: Secondary | ICD-10-CM | POA: Diagnosis not present

## 2022-07-04 ENCOUNTER — Ambulatory Visit
Admission: RE | Admit: 2022-07-04 | Discharge: 2022-07-04 | Disposition: A | Discharge status: Home / Self Care | Payer: Medicare HMO | Source: Ambulatory Visit | Attending: Rheumatology | Admitting: Rheumatology

## 2022-07-04 DIAGNOSIS — R6889 Other general symptoms and signs: Secondary | ICD-10-CM | POA: Diagnosis not present

## 2022-07-04 DIAGNOSIS — M25512 Pain in left shoulder: Secondary | ICD-10-CM | POA: Diagnosis not present

## 2022-07-06 NOTE — Patient Instructions (Signed)
SURGICAL WAITING ROOM VISITATION Patients having surgery or a procedure may have no more than 2 support people in the waiting area - these visitors may rotate.   Children under the age of 3 must have an adult with them who is not the patient. If the patient needs to stay at the hospital during part of their recovery, the visitor guidelines for inpatient rooms apply. Pre-op nurse will coordinate an appropriate time for 1 support person to accompany patient in pre-op.  This support person may not rotate.    Please refer to the [facility] website for the visitor guidelines for Inpatients (after your surgery is over and you are in a regular room).    Your procedure is scheduled on: 07/20/22   Report to [facility] Main Entrance    Report to admitting at 7:15 AM   Call this number if you have problems the morning of surgery [phone]   Do not eat food :After Midnight.   After Midnight you may have the following liquids until 6:30 AM DAY OF SURGERY  Water Non-Citrus Juices (without pulp, NO RED) Carbonated Beverages Black Coffee (NO MILK/CREAM OR CREAMERS, sugar ok)  Clear Tea (NO MILK/CREAM OR CREAMERS, sugar ok) regular and decaf                             Plain Jell-O (NO RED)                                           Fruit ices (not with fruit pulp, NO RED)                                     Popsicles (NO RED)                                                               Sports drinks like Gatorade (NO RED)  FOLLOW BOWEL PREP AND ANY ADDITIONAL PRE OP INSTRUCTIONS YOU RECEIVED FROM YOUR SURGEON'S OFFICE!!!     Oral Hygiene is also important to reduce your risk of infection.                                    Remember - BRUSH YOUR TEETH THE MORNING OF SURGERY WITH YOUR REGULAR TOOTHPASTE   Do NOT smoke after Midnight   Take these medicines the morning of surgery with A SIP OF WATER: Inhalers, Atorvastatin, Gabapentin   DO NOT TAKE ANY ORAL DIABETIC MEDICATIONS DAY  OF YOUR SURGERY  How to Manage Your Diabetes Before and After Surgery  Why is it important to control my blood sugar before and after surgery? Improving blood sugar levels before and after surgery helps healing and can limit problems. A way of improving blood sugar control is eating a healthy diet by:  Eating less sugar and carbohydrates  Increasing activity/exercise  Talking with your doctor about reaching your blood sugar goals High blood sugars (greater than 180 mg/dL) can raise your risk  of infections and slow your recovery, so you will need to focus on controlling your diabetes during the weeks before surgery. Make sure that the doctor who takes care of your diabetes knows about your planned surgery including the date and location.  How do I manage my blood sugar before surgery? Check your blood sugar at least 4 times a day, starting 2 days before surgery, to make sure that the level is not too high or low. Check your blood sugar the morning of your surgery when you wake up and every 2 hours until you get to the Short Stay unit. If your blood sugar is less than 70 mg/dL, you will need to treat for low blood sugar: Do not take insulin. Treat a low blood sugar (less than 70 mg/dL) with  cup of clear juice (cranberry or apple), 4 glucose tablets, OR glucose gel. Recheck blood sugar in 15 minutes after treatment (to make sure it is greater than 70 mg/dL). If your blood sugar is not greater than 70 mg/dL on recheck, call [phone] for further instructions. Report your blood sugar to the short stay nurse when you get to Short Stay.  If you are admitted to the hospital after surgery: Your blood sugar will be checked by the staff and you will probably be given insulin after surgery (instead of oral diabetes medicines) to make sure you have good blood sugar levels. The goal for blood sugar control after surgery is 80-180 mg/dL.   WHAT DO I DO ABOUT MY DIABETES MEDICATION?  Do not take  oral diabetes medicines (pills) the morning of surgery.  Do NOT take Trulicity 0/81/44. Hold Jardiance 3 days before surgery  THE DAY BEFORE SURGERY, take Metformin as prescribed.      THE MORNING OF SURGERY, do not take Metformin   The day of surgery, do not take other diabetes injectables, including Byetta (exenatide), Bydureon (exenatide ER), Victoza (liraglutide), or Trulicity (dulaglutide).  Reviewed and Endorsed by [facility] Patient Education Committee, August 2015   Bring CPAP mask and tubing day of surgery.                              You may not have any metal on your body including hair pins, jewelry, and body piercing             Do not wear make-up, lotions, powders, perfumes, or deodorant  Do not wear nail polish including gel and S&S, artificial/acrylic nails, or any other type of covering on natural nails including finger and toenails. If you have artificial nails, gel coating, etc. that needs to be removed by a nail salon please have this removed prior to surgery or surgery may need to be canceled/ delayed if the surgeon/ anesthesia feels like they are unable to be safely monitored.   Do not shave  48 hours prior to surgery.    Do not bring valuables to the hospital. [place].  DO NOT [place]. PHARMACY WILL DISPENSE MEDICATIONS LISTED ON YOUR MEDICATION LIST TO YOU DURING YOUR ADMISSION [place]!    Patients discharged on the day of surgery will not be allowed to drive home.  Someone NEEDS to stay with you for the first 24 hours after anesthesia.   Special Instructions: Bring a copy of your healthcare  power of attorney and living will documents         the day of surgery if you haven't scanned them before.              Please read over the following fact sheets you were given: IF YOU HAVE QUESTIONS ABOUT YOUR PRE-OP INSTRUCTIONS PLEASE CALL [place] - Preparing for Surgery Before surgery, you can play an important role.  Because skin is not sterile, your skin needs to be as free of germs as possible.  You can reduce the number of germs on your skin by washing with CHG (chlorahexidine gluconate) soap before surgery.  CHG is an antiseptic cleaner which kills germs and bonds with the skin to continue killing germs even after washing. Please DO NOT use if you have an allergy to CHG or antibacterial soaps.  If your skin becomes reddened/irritated stop using the CHG and inform your nurse when you arrive at Short Stay. Do not shave (including legs and underarms) for at least 48 hours prior to the first CHG shower.  You may shave your face/neck.  Please follow these instructions carefully:  1.  Shower with CHG Soap the night before surgery and the  morning of surgery.  2.  If you choose to wash your hair, wash your hair first as usual with your normal  shampoo.  3.  After you shampoo, rinse your hair and body thoroughly to remove the shampoo.                             4.  Use CHG as you would any other liquid soap.  You can apply chg directly to the skin and wash.  Gently with a scrungie or clean washcloth.  5.  Apply the CHG Soap to your body ONLY FROM THE NECK DOWN.   Do   not use on face/ open                           Wound or open sores. Avoid contact with eyes, ears mouth and   genitals (private parts).                       Wash face,  Genitals (private parts) with your normal soap.             6.  Wash thoroughly, paying special attention to the area where your    surgery  will be performed.  7.  Thoroughly rinse your body with warm water from the neck down.  8.  DO NOT shower/wash with your normal soap after using and rinsing off the CHG Soap.                9.  Pat yourself dry with a clean towel.            10.  Wear clean pajamas.            11.  Place clean sheets on your bed the night of your first shower and do not  sleep with  pets. Day of Surgery : Do not apply any lotions/deodorants the morning of surgery.  Please wear clean clothes to the hospital/surgery center.  FAILURE TO FOLLOW THESE INSTRUCTIONS MAY RESULT IN THE CANCELLATION OF YOUR SURGERY  PATIENT SIGNATURE_________________________________  NURSE SIGNATURE__________________________________  ________________________________________________________________________

## 2022-07-06 NOTE — Progress Notes (Signed)
COVID Vaccine Completed: yes x2  Date of COVID positive in last 90 days:  PCP - Merrilee Seashore, MD Cardiologist -   Chest x-ray -  EKG -  Stress Test -  ECHO -  Cardiac Cath -  Pacemaker/ICD device last checked: Spinal Cord Stimulator:  Bowel Prep -   Sleep Study -  CPAP -   Fasting Blood Sugar -  Checks Blood Sugar _____ times a day  Blood Thinner Instructions: Aspirin Instructions: Last Dose:  Activity level:  Can go up a flight of stairs and perform activities of daily living without stopping and without symptoms of chest pain or shortness of breath.  Able to exercise without symptoms  Unable to go up a flight of stairs without symptoms of     Anesthesia review:   Patient denies shortness of breath, fever, cough and chest pain at PAT appointment  Patient verbalized understanding of instructions that were given to them at the PAT appointment. Patient was also instructed that they will need to review over the PAT instructions again at home before surgery.

## 2022-07-07 ENCOUNTER — Encounter (HOSPITAL_COMMUNITY)
Admission: RE | Admit: 2022-07-07 | Discharge: 2022-07-07 | Disposition: A | Discharge status: Home / Self Care | Payer: Medicare HMO | Source: Ambulatory Visit | Attending: Surgery | Admitting: Surgery

## 2022-07-07 ENCOUNTER — Encounter (HOSPITAL_COMMUNITY): Payer: Self-pay

## 2022-07-07 VITALS — BP 147/102 | HR 86 | Temp 98.2°F | Resp 16 | Ht 62.0 in | Wt 183.0 lb

## 2022-07-07 DIAGNOSIS — Z01818 Encounter for other preprocedural examination: Secondary | ICD-10-CM | POA: Insufficient documentation

## 2022-07-07 DIAGNOSIS — E119 Type 2 diabetes mellitus without complications: Secondary | ICD-10-CM | POA: Diagnosis not present

## 2022-07-07 DIAGNOSIS — R6889 Other general symptoms and signs: Secondary | ICD-10-CM | POA: Diagnosis not present

## 2022-07-07 HISTORY — DX: Respiratory tuberculosis unspecified: A15.9

## 2022-07-07 HISTORY — DX: Gastro-esophageal reflux disease without esophagitis: K21.9

## 2022-07-07 LAB — GLUCOSE, CAPILLARY: Glucose-Capillary: 108 mg/dL — ABNORMAL HIGH (ref 70–99)

## 2022-07-07 LAB — BASIC METABOLIC PANEL
Anion gap: 5 (ref 5–15)
BUN: 8 mg/dL (ref 6–20)
CO2: 27 mmol/L (ref 22–32)
Calcium: 8.9 mg/dL (ref 8.9–10.3)
Chloride: 109 mmol/L (ref 98–111)
Creatinine, Ser: 0.68 mg/dL (ref 0.44–1.00)
GFR, Estimated: >60 mL/min (ref >60)
Glucose, Bld: 101 mg/dL — ABNORMAL HIGH (ref 70–99)
Potassium: 4.1 mmol/L (ref 3.5–5.1)
Sodium: 141 mmol/L (ref 135–145)

## 2022-07-07 LAB — CBC
HCT: 45.1 % (ref 36.0–46.0)
Hemoglobin: 15.3 g/dL — ABNORMAL HIGH (ref 12.0–15.0)
MCH: 29.8 pg (ref 26.0–34.0)
MCHC: 33.9 g/dL (ref 30.0–36.0)
MCV: 87.9 fL (ref 80.0–100.0)
Platelets: 302 10*3/uL (ref 150–400)
RBC: 5.13 MIL/uL — ABNORMAL HIGH (ref 3.87–5.11)
RDW: 14 % (ref 11.5–15.5)
WBC: 10.4 10*3/uL (ref 4.0–10.5)
nRBC: 0 % (ref 0.0–0.2)

## 2022-07-07 LAB — HEMOGLOBIN A1C
Hgb A1c MFr Bld: 6.1 % — ABNORMAL HIGH (ref 4.8–5.6)
Mean Plasma Glucose: 128.37 mg/dL

## 2022-07-12 DIAGNOSIS — R6889 Other general symptoms and signs: Secondary | ICD-10-CM | POA: Diagnosis not present

## 2022-07-12 DIAGNOSIS — M65341 Trigger finger, right ring finger: Secondary | ICD-10-CM | POA: Diagnosis not present

## 2022-07-20 ENCOUNTER — Other Ambulatory Visit: Payer: Self-pay

## 2022-07-20 ENCOUNTER — Ambulatory Visit (HOSPITAL_BASED_OUTPATIENT_CLINIC_OR_DEPARTMENT_OTHER): Payer: Medicare HMO | Admitting: Certified Registered Nurse Anesthetist

## 2022-07-20 ENCOUNTER — Ambulatory Visit (HOSPITAL_COMMUNITY): Payer: Medicare HMO | Admitting: Physician Assistant

## 2022-07-20 ENCOUNTER — Ambulatory Visit (HOSPITAL_COMMUNITY)
Admission: RE | Admit: 2022-07-20 | Discharge: 2022-07-20 | Disposition: A | Discharge status: Home / Self Care | Payer: Medicare HMO | Source: Ambulatory Visit | Attending: Surgery | Admitting: Surgery

## 2022-07-20 ENCOUNTER — Encounter (HOSPITAL_COMMUNITY): Payer: Self-pay | Admitting: Surgery

## 2022-07-20 ENCOUNTER — Encounter (HOSPITAL_COMMUNITY)
Admission: RE | Disposition: A | Discharge status: Home / Self Care | Payer: Self-pay | Source: Ambulatory Visit | Attending: Surgery

## 2022-07-20 DIAGNOSIS — Z79899 Other long term (current) drug therapy: Secondary | ICD-10-CM | POA: Insufficient documentation

## 2022-07-20 DIAGNOSIS — J45909 Unspecified asthma, uncomplicated: Secondary | ICD-10-CM | POA: Insufficient documentation

## 2022-07-20 DIAGNOSIS — K429 Umbilical hernia without obstruction or gangrene: Secondary | ICD-10-CM | POA: Diagnosis not present

## 2022-07-20 DIAGNOSIS — I10 Essential (primary) hypertension: Secondary | ICD-10-CM

## 2022-07-20 DIAGNOSIS — Z7985 Long-term (current) use of injectable non-insulin antidiabetic drugs: Secondary | ICD-10-CM | POA: Insufficient documentation

## 2022-07-20 DIAGNOSIS — D573 Sickle-cell trait: Secondary | ICD-10-CM | POA: Diagnosis not present

## 2022-07-20 DIAGNOSIS — Z7951 Long term (current) use of inhaled steroids: Secondary | ICD-10-CM | POA: Diagnosis not present

## 2022-07-20 DIAGNOSIS — E119 Type 2 diabetes mellitus without complications: Secondary | ICD-10-CM

## 2022-07-20 DIAGNOSIS — F17210 Nicotine dependence, cigarettes, uncomplicated: Secondary | ICD-10-CM | POA: Insufficient documentation

## 2022-07-20 DIAGNOSIS — Z7984 Long term (current) use of oral hypoglycemic drugs: Secondary | ICD-10-CM

## 2022-07-20 DIAGNOSIS — K219 Gastro-esophageal reflux disease without esophagitis: Secondary | ICD-10-CM | POA: Diagnosis not present

## 2022-07-20 DIAGNOSIS — M1990 Unspecified osteoarthritis, unspecified site: Secondary | ICD-10-CM | POA: Insufficient documentation

## 2022-07-20 DIAGNOSIS — F17200 Nicotine dependence, unspecified, uncomplicated: Secondary | ICD-10-CM | POA: Diagnosis not present

## 2022-07-20 DIAGNOSIS — R6889 Other general symptoms and signs: Secondary | ICD-10-CM | POA: Diagnosis not present

## 2022-07-20 HISTORY — PX: UMBILICAL HERNIA REPAIR: SHX196

## 2022-07-20 SURGERY — REPAIR, HERNIA, UMBILICAL, ADULT
Anesthesia: General

## 2022-07-20 MED ORDER — 0.9 % SODIUM CHLORIDE (POUR BTL) OPTIME
TOPICAL | Status: DC | PRN
  Administered 2022-07-20: 1000 mL

## 2022-07-20 MED ORDER — CHLORHEXIDINE GLUCONATE CLOTH 2 % EX PADS: 6.0000 | MEDICATED_PAD | Freq: Once | CUTANEOUS | Status: DC

## 2022-07-20 MED ORDER — MEPERIDINE HCL 50 MG/ML IJ SOLN: 6.2500 mg | INTRAMUSCULAR | Status: DC | PRN

## 2022-07-20 MED ORDER — ORAL CARE MOUTH RINSE
15.0000 mL | Freq: Once | OROMUCOSAL | Status: AC
Start: 2022-07-20 — End: 2022-07-20

## 2022-07-20 MED ORDER — BUPIVACAINE-EPINEPHRINE 0.25% -1:200000 IJ SOLN
INTRAMUSCULAR | Status: DC | PRN
  Administered 2022-07-20: 30 mL

## 2022-07-20 MED ORDER — PROPOFOL 10 MG/ML IV BOLUS
INTRAVENOUS | Status: AC
  Filled 2022-07-20: qty 20

## 2022-07-20 MED ORDER — KETOROLAC TROMETHAMINE 15 MG/ML IJ SOLN: 15.0000 mg | INTRAMUSCULAR | Status: DC

## 2022-07-20 MED ORDER — FENTANYL CITRATE (PF) 100 MCG/2ML IJ SOLN
INTRAMUSCULAR | Status: AC
  Filled 2022-07-20: qty 2

## 2022-07-20 MED ORDER — OXYCODONE HCL 5 MG PO TABS
5.0000 mg | ORAL_TABLET | Freq: Once | ORAL | Status: AC | PRN
  Administered 2022-07-20: 5 mg via ORAL

## 2022-07-20 MED ORDER — CEFAZOLIN SODIUM-DEXTROSE 2-4 GM/100ML-% IV SOLN
2.0000 g | INTRAVENOUS | Status: AC
  Administered 2022-07-20: 2 g via INTRAVENOUS
  Filled 2022-07-20: qty 100

## 2022-07-20 MED ORDER — KETAMINE HCL 10 MG/ML IJ SOLN
INTRAMUSCULAR | Status: DC | PRN
  Administered 2022-07-20: 20 mg via INTRAVENOUS
  Administered 2022-07-20: 30 mg via INTRAVENOUS

## 2022-07-20 MED ORDER — KETAMINE HCL 10 MG/ML IJ SOLN
INTRAMUSCULAR | Status: AC
  Filled 2022-07-20: qty 1

## 2022-07-20 MED ORDER — AMISULPRIDE (ANTIEMETIC) 5 MG/2ML IV SOLN: 10.0000 mg | Freq: Once | INTRAVENOUS | Status: DC | PRN

## 2022-07-20 MED ORDER — PROMETHAZINE HCL 25 MG/ML IJ SOLN: 6.2500 mg | INTRAMUSCULAR | Status: DC | PRN

## 2022-07-20 MED ORDER — PROPOFOL 10 MG/ML IV BOLUS
INTRAVENOUS | Status: DC | PRN
  Administered 2022-07-20: 170 mg via INTRAVENOUS

## 2022-07-20 MED ORDER — OXYCODONE HCL 5 MG PO TABS
ORAL_TABLET | ORAL | Status: AC
  Filled 2022-07-20: qty 1

## 2022-07-20 MED ORDER — MIDAZOLAM HCL 2 MG/2ML IJ SOLN
INTRAMUSCULAR | Status: AC
  Filled 2022-07-20: qty 2

## 2022-07-20 MED ORDER — HYDROMORPHONE HCL 1 MG/ML IJ SOLN
0.2500 mg | INTRAMUSCULAR | Status: DC | PRN
  Administered 2022-07-20 (×2): 0.5 mg via INTRAVENOUS

## 2022-07-20 MED ORDER — FENTANYL CITRATE (PF) 100 MCG/2ML IJ SOLN
INTRAMUSCULAR | Status: DC | PRN
  Administered 2022-07-20: 100 ug via INTRAVENOUS

## 2022-07-20 MED ORDER — HYDROMORPHONE HCL 1 MG/ML IJ SOLN
INTRAMUSCULAR | Status: AC
  Filled 2022-07-20: qty 1

## 2022-07-20 MED ORDER — LIDOCAINE 2% (20 MG/ML) 5 ML SYRINGE
INTRAMUSCULAR | Status: DC | PRN
  Administered 2022-07-20: 80 mg via INTRAVENOUS

## 2022-07-20 MED ORDER — GABAPENTIN 300 MG PO CAPS
300.0000 mg | ORAL_CAPSULE | ORAL | Status: DC
  Filled 2022-07-20: qty 1

## 2022-07-20 MED ORDER — MIDAZOLAM HCL 5 MG/5ML IJ SOLN
INTRAMUSCULAR | Status: DC | PRN
  Administered 2022-07-20: 2 mg via INTRAVENOUS

## 2022-07-20 MED ORDER — DEXAMETHASONE SODIUM PHOSPHATE 10 MG/ML IJ SOLN
INTRAMUSCULAR | Status: DC | PRN
  Administered 2022-07-20: 10 mg via INTRAVENOUS

## 2022-07-20 MED ORDER — SUGAMMADEX SODIUM 200 MG/2ML IV SOLN
INTRAVENOUS | Status: DC | PRN
  Administered 2022-07-20: 200 mg via INTRAVENOUS

## 2022-07-20 MED ORDER — ONDANSETRON HCL 4 MG/2ML IJ SOLN
INTRAMUSCULAR | Status: DC | PRN
  Administered 2022-07-20: 4 mg via INTRAVENOUS

## 2022-07-20 MED ORDER — OXYCODONE-ACETAMINOPHEN 5-325 MG PO TABS: 1.0000 | ORAL_TABLET | ORAL | 0 refills | Status: DC | PRN

## 2022-07-20 MED ORDER — ONDANSETRON HCL 4 MG/2ML IJ SOLN
INTRAMUSCULAR | Status: AC
  Filled 2022-07-20: qty 2

## 2022-07-20 MED ORDER — ROCURONIUM BROMIDE 10 MG/ML (PF) SYRINGE
PREFILLED_SYRINGE | INTRAVENOUS | Status: DC | PRN
  Administered 2022-07-20: 80 mg via INTRAVENOUS

## 2022-07-20 MED ORDER — OXYCODONE HCL 5 MG/5ML PO SOLN: 5.0000 mg | Freq: Once | ORAL | Status: AC | PRN

## 2022-07-20 MED ORDER — LACTATED RINGERS IV SOLN: INTRAVENOUS | Status: DC

## 2022-07-20 MED ORDER — BUPIVACAINE-EPINEPHRINE (PF) 0.25% -1:200000 IJ SOLN
INTRAMUSCULAR | Status: AC
  Filled 2022-07-20: qty 30

## 2022-07-20 MED ORDER — ACETAMINOPHEN 500 MG PO TABS
1000.0000 mg | ORAL_TABLET | ORAL | Status: AC
  Administered 2022-07-20: 1000 mg via ORAL
  Filled 2022-07-20: qty 2

## 2022-07-20 MED ORDER — BUPIVACAINE LIPOSOME 1.3 % IJ SUSP: 20.0000 mL | Freq: Once | INTRAMUSCULAR | Status: DC

## 2022-07-20 MED ORDER — DEXAMETHASONE SODIUM PHOSPHATE 10 MG/ML IJ SOLN
INTRAMUSCULAR | Status: AC
  Filled 2022-07-20: qty 1

## 2022-07-20 MED ORDER — CHLORHEXIDINE GLUCONATE 0.12 % MT SOLN
15.0000 mL | Freq: Once | OROMUCOSAL | Status: AC
  Administered 2022-07-20: 15 mL via OROMUCOSAL

## 2022-07-20 SURGICAL SUPPLY — 27 items
BLADE HEX COATED 2.75 (ELECTRODE) IMPLANT
COTTONBALL LRG STERILE PKG (GAUZE/BANDAGES/DRESSINGS) ×1 IMPLANT
COVER SURGICAL LIGHT HANDLE (MISCELLANEOUS) ×1 IMPLANT
DERMABOND ADVANCED .7 DNX12 (GAUZE/BANDAGES/DRESSINGS) ×1 IMPLANT
DRAPE LAPAROTOMY T 98X78 PEDS (DRAPES) ×1 IMPLANT
DRSG TEGADERM 4X4.75 (GAUZE/BANDAGES/DRESSINGS) ×1 IMPLANT
ELECT REM PT RETURN 15FT ADLT (MISCELLANEOUS) ×1 IMPLANT
GLOVE BIOGEL PI IND STRL 8 (GLOVE) ×1 IMPLANT
GOWN STRL REUS W/ TWL XL LVL3 (GOWN DISPOSABLE) ×1 IMPLANT
GOWN STRL REUS W/TWL XL LVL3 (GOWN DISPOSABLE) ×1
KIT BASIN OR (CUSTOM PROCEDURE TRAY) ×1 IMPLANT
KIT TURNOVER KIT A (KITS) IMPLANT
MESH 3DMAX LIGHT 4.1X6.2 LT LR (Mesh General) IMPLANT
NEEDLE HYPO 22GX1.5 SAFETY (NEEDLE) ×1 IMPLANT
NS IRRIG 1000ML POUR BTL (IV SOLUTION) ×1 IMPLANT
PACK GENERAL/GYN (CUSTOM PROCEDURE TRAY) ×1 IMPLANT
SPIKE FLUID TRANSFER (MISCELLANEOUS) ×1 IMPLANT
SUT MNCRL AB 4-0 PS2 18 (SUTURE) ×1 IMPLANT
SUT NOVA 0 T19/GS 22DT (SUTURE) IMPLANT
SUT NOVA NAB GS-21 0 18 T12 DT (SUTURE) IMPLANT
SUT PDS AB 2-0 CT2 27 (SUTURE) IMPLANT
SUT VIC AB 3-0 SH 27 (SUTURE) ×1
SUT VIC AB 3-0 SH 27XBRD (SUTURE) IMPLANT
SUT VIC AB 3-0 SH 8-18 (SUTURE) ×1 IMPLANT
SYR CONTROL 10ML LL (SYRINGE) ×1 IMPLANT
TOWEL OR 17X26 10 PK STRL BLUE (TOWEL DISPOSABLE) ×1 IMPLANT
TRAY FOLEY MTR SLVR 16FR STAT (SET/KITS/TRAYS/PACK) IMPLANT

## 2022-07-20 NOTE — Anesthesia Preprocedure Evaluation (Signed)
Anesthesia Evaluation  Patient identified by MRN, date of birth, ID band Patient awake    Reviewed: Allergy & Precautions, NPO status , Patient's Chart, lab work & pertinent test results  Airway Mallampati: II  TM Distance: >3 FB Neck ROM: Full    Dental no notable dental hx.    Pulmonary asthma , sleep apnea , Current Smoker and Patient abstained from smoking.,    Pulmonary exam normal breath sounds clear to auscultation       Cardiovascular hypertension, Pt. on medications negative cardio ROS Normal cardiovascular exam Rhythm:Regular Rate:Normal     Neuro/Psych negative neurological ROS  negative psych ROS   GI/Hepatic Neg liver ROS, GERD  ,  Endo/Other  negative endocrine ROSdiabetes, Type 2  Renal/GU negative Renal ROS  negative genitourinary   Musculoskeletal  (+) Arthritis , Osteoarthritis,    Abdominal (+) + obese,   Peds negative pediatric ROS (+)  Hematology negative hematology ROS (+)   Anesthesia Other Findings   Reproductive/Obstetrics negative OB ROS                             Anesthesia Physical Anesthesia Plan  ASA: 3  Anesthesia Plan: General   Post-op Pain Management: Tylenol PO (pre-op)*, Dilaudid IV, Ketamine IV* and Lidocaine infusion*   Induction: Intravenous  PONV Risk Score and Plan: 2 and Ondansetron, Midazolam and Treatment may vary due to age or medical condition  Airway Management Planned: Oral ETT  Additional Equipment:   Intra-op Plan:   Post-operative Plan: Extubation in OR  Informed Consent: I have reviewed the patients History and Physical, chart, labs and discussed the procedure including the risks, benefits and alternatives for the proposed anesthesia with the patient or authorized representative who has indicated his/her understanding and acceptance.     Dental advisory given  Plan Discussed with: CRNA  Anesthesia Plan Comments:          Anesthesia Quick Evaluation

## 2022-07-20 NOTE — H&P (Signed)
Admitting Physician: Nickola Major Ewart Carrera  Service: General surgery  CC: Hernia  Subjective   HPI: Lindsay Ellis is an 46 y.o. female who is here for umbilical hernia repair  Past Medical History:  Diagnosis Date   Asthma    Diabetes mellitus without complication (HCC)    GERD (gastroesophageal reflux disease)    Hyperlipidemia    Hypertension    Osteoarthritis    Sickle cell trait ([place])    Tuberculosis    positive test TB, treated in 2003   Vertigo     Past Surgical History:  Procedure Laterality Date   [place] [facility]  2011    Family History  Problem Relation Age of Onset   Lung cancer Mother     Social:  reports that she has been smoking cigarettes. She has been smoking an average of .25 packs per day. She has never used smokeless tobacco. She reports current alcohol use. She reports that she does not use drugs.  Allergies:  Allergies  Allergen Reactions   Morphine And Related Hives and Itching    Medications: Current Outpatient Medications  Medication Instructions   albuterol (ACCUNEB) 1.25 MG/3ML nebulizer solution 1 ampule, Nebulization, Every 6 hours PRN   amitriptyline (ELAVIL) 100 mg, Oral, Daily at bedtime   atorvastatin (LIPITOR) 20 mg, Oral, Daily   baclofen (LIORESAL) 30 mg, Oral, 3 times daily   Blood Glucose Monitoring Suppl (ACCU-CHEK AVIVA PLUS) w/Device KIT No dose, route, or frequency recorded.   cyclobenzaprine (FLEXERIL) 10 mg, Oral, Daily at bedtime   Ferrous Sulfate (IRON PO) 1 tablet, Oral, Daily   gabapentin (NEURONTIN) 1,500 mg, Oral, 3 times daily   hydrochlorothiazide (HYDRODIURIL) 12.5 mg, Oral, Daily   Jardiance 25 mg, Oral, Daily   meclizine (ANTIVERT) 25 mg, Oral, 3 times daily   meloxicam (MOBIC) 15 mg, Oral, Daily   metFORMIN (GLUCOPHAGE-XR) 1,000 mg, Oral, 2 times daily   montelukast (SINGULAIR) 10 mg, Oral, Daily at bedtime   Multiple Vitamin (MULTIVITAMIN) tablet 1 tablet, Oral, Daily    PROAIR HFA 108 (90 Base) MCG/ACT inhaler 2 puffs, Inhalation, Every 4 hours PRN   RABEprazole (ACIPHEX) 20 mg, Oral, Daily   SYMBICORT 160-4.5 MCG/ACT inhaler 2 puffs, Inhalation, 2 times daily PRN   telmisartan (MICARDIS) 40 mg, Oral, Daily   Trulicity 1.5 mg, Subcutaneous, Every Sun    ROS - all of the below systems have been reviewed with the patient and positives are indicated with bold text General: chills, fever or night sweats Eyes: blurry vision or double vision ENT: epistaxis or sore throat Allergy/Immunology: itchy/watery eyes or nasal congestion Hematologic/Lymphatic: bleeding problems, blood clots or swollen lymph nodes Endocrine: temperature intolerance or unexpected weight changes Breast: new or changing breast lumps or nipple discharge Resp: cough, shortness of breath, or wheezing CV: chest pain or dyspnea on exertion GI: as per HPI GU: dysuria, trouble voiding, or hematuria MSK: joint pain or joint stiffness Neuro: TIA or stroke symptoms Derm: pruritus and skin lesion changes Psych: anxiety and depression  Objective   PE Blood pressure (!) 155/102, pulse 91, temperature 98.7 F (37.1 C), temperature source Oral, resp. rate 16, height 5' 2"  (1.575 m), weight 83 kg, last menstrual period 11/02/2015, SpO2 98 %. Constitutional: NAD; conversant; no deformities Eyes: Moist conjunctiva; no lid lag; anicteric; PERRL Neck: Trachea midline; no thyromegaly Lungs: Normal respiratory effort; no tactile fremitus CV: RRR; no palpable thrills; no pitting edema GI: Abd umbilical hernia; no palpable hepatosplenomegaly  MSK: Normal range of motion of extremities; no clubbing/cyanosis Psychiatric: Appropriate affect; alert and oriented x3 Lymphatic: No palpable cervical or axillary lymphadenopathy  No results found for this or any previous visit (from the past 24 hour(s)).  Imaging Orders  No imaging studies ordered today     Assessment and Plan   [name] is an 46 y.o. female with an umbilical hernia.  I recommended open umbilical hernia repair, possibly with mesh.  The procedure, its risks, benefits and alternatives were discussed and the patient granted consent to proceed.     Felicie Morn, MD  [facility] Use [email or web address] to contact on call provider

## 2022-07-20 NOTE — Op Note (Signed)
   Patient: Lindsay Ellis (23-Aug-1976, [id number])  Date of Surgery: 07/20/2022   Preoperative Diagnosis: UMBILICAL HERNIA   Postoperative Diagnosis: UMBILICAL HERNIA   Surgical Procedure: OPEN UMBILICAL HERNIA REPAIR WITH MESH:    Operative Team Members:  Surgeon(s) and Role:    * Yazhini Mcaulay, Nickola Major, MD - Primary   Anesthesiologist: Lynda Rainwater, MD CRNA: Gerald Leitz, CRNA   Anesthesia: General   Fluids:  Total I/O In: 1400 [I.V.:1300; IV Piggyback:100] Out: 15 [SVXBL:39]  Complications: None  Drains:  none   Specimen: None  Disposition:  PACU - hemodynamically stable.  Plan of Care: Discharge to home after PACU    Indications for Procedure: Lindsay Ellis is a 46 y.o. female who presented with an umbilical hernia.  I recommended open repair with mesh.  The procedure, its risks, benefits and alternatives were discussed and the patient granted consent to proceed.  We will proceed as scheduled.  Findings:  Hernia Location: Umbilicalsuprapubic  Hernia Size:  4cm x 4 cm  Mesh Size &Type:  9cm wide x 11 cm tall Bard Soft Mesh Position: Preperitoneal  Description of Procedure:  The patient was positioned supine, padded and secured to the bed.  The abdomen was widely prepped and draped.  A time out procedure was performed.   A curvilinear incision was made above the umbilicus and dissection was carried down through the subcutaneous tissue to the level of the fascia.  The umbilical stalk was encircled and the hernia sac amputated off the umbilical skin.  The hernia defect was measured as 4 cm wide by 4 cm in vertical dimension.    An umbilical hernia repair with mesh was performed.  The hernia sac was scored along the perimeter of the fascial defect, entering the pre peritoneal plane.  A wide pre peritoneal dissection was performed creating a space for mesh placement.  The defect in the peritoneum was closed using 3-0 vicryl.  A piece of Bard Soft was opened and  trimmed to 9 cm wide x 11 cm tall and deployed into the pre peritoneal space.  The mesh laid in taut position in the pre peritoneal plane and did not require fixation.  The space was irrigated with normal saline.  The fascial defect was closed horizontally, overtop the mesh with figure of eight 0-novafil  suture.  The umbilical skin was tacked down to the fascial repair with 3-0 vicryl suture.  The skin was closed with 4-0 Monocryl subcuticular suture and skin glue.  A small gauze pressure dressing was placed into the umbilicus and covered with a Tegaderm dressing.   Louanna Raw, MD General, Bariatric, & Minimally Invasive Surgery [facility], [place]

## 2022-07-20 NOTE — Transfer of Care (Signed)
Immediate Anesthesia Transfer of Care Note  Patient: Lindsay Ellis  Procedure(s) Performed: Procedure(s): OPEN UMBILICAL HERNIA REPAIR WITH MESH (N/A)  Patient Location: PACU  Anesthesia Type:General  Level of Consciousness: Alert, Awake, Oriented  Airway & Oxygen Therapy: Patient Spontanous Breathing  Post-op Assessment: Report given to RN  Post vital signs: Reviewed and stable  Last Vitals:  Vitals:   07/20/22 0708 07/20/22 0714  BP: (!) 154/105 (!) 155/102  Pulse: 91   Resp: 16   Temp: 37.1 C   SpO2: 53%     Complications: No apparent anesthesia complications

## 2022-07-20 NOTE — Progress Notes (Signed)
Pt discharged in NAD, VSS, pain minimal. Pt given discharge instructions. Pt discharged home with husband.

## 2022-07-20 NOTE — Discharge Instructions (Signed)
 VENTRAL HERNIA REPAIR POST OPERATIVE INSTRUCTIONS  Thinking Clearly  The anesthesia may cause you to feel different for 1 or 2 days. Do not drive, drink alcohol, or make any big decisions for at least 2 days.  Nutrition When you wake up, you will be able to drink small amounts of liquid. If you do not feel sick, you can slowly advance your diet to regular foods. Continue to drink lots of fluids, usually about 8 to 10 glasses per day. Eat a high-fiber diet so you don't strain during bowel movements. High-Fiber Foods Foods high in fiber include beans, bran cereals and whole-grain breads, peas, dried fruit (figs, apricots, and dates), raspberries, blackberries, strawberries, sweet corn, broccoli, baked potatoes with skin, plums, pears, apples, greens, and nuts. Activity Slowly increase your activity. Be sure to get up and walk every hour or so to prevent blood clots. No heavy lifting or strenuous activity for 4 weeks following surgery to prevent hernias at your incision sites or recurrence of your hernia. It is normal to feel tired. You may need more sleep than usual.  Get your rest but make sure to get up and move around frequently to prevent blood clots and pneumonia.  Work and Return to School You can go back to work when you feel well enough. Discuss the timing with your surgeon. You can usually go back to school or work 1 week or less after an laparoscopic or an open repair. If your work requires heavy lifting or strenuous activity you need to be placed on light duty for 4 weeks following surgery. You can return to gym class, sports or other physical activities 4 weeks after surgery.  Wound Care You may experience significant bruising throughout the abdominal wall that may track down into the groin including into the scrotum in males.  Rest, elevating the groin and scrotum above the level of the heart, ice and compression with tight fitting underwear or an abdominal binder can help.   Always wash your hands before and after touching near your incision site. Do not soak in a bathtub until cleared at your follow up appointment. You may take a shower 24 hours after surgery. A small amount of drainage from the incision is normal. If the drainage is thick and yellow or the site is red, you may have an infection, so call your surgeon. If you have a drain in one of your incisions, it will be taken out in office when the drainage stops. Steri-Strips will fall off in 7 to 10 days or they will be removed during your first office visit. If you have dermabond glue covering over the incision, allow the glue to flake off on its own. Protect the new skin, especially from the sun. The sun can burn and cause darker scarring. Your scar will heal in about 4 to 6 weeks and will become softer and continue to fade over the next year.  The cosmetic appearance of the incisions will improve over the course of the first year after surgery. Sensation around your incision will return in a few weeks or months.  Bowel Movements After intestinal surgery, you may have loose watery stools for several days. If watery diarrhea lasts longer than 3 days, contact your surgeon. Pain medication (narcotics) can cause constipation. Increase the fiber in your diet with high-fiber foods if you are constipated. You can take an over the counter stool softener like Colace to avoid constipation.  Additional over the counter medications can also be used   if Colace isn't sufficient (for example, Milk of Magnesia or Miralax).  Pain The amount of pain is different for each person. Some people need only 1 to 3 doses of pain control medication, while others need more. Take alternating doses of tylenol and ibuprofen around the clock for the first five days following surgery.  This will provide a baseline of pain control and help with inflammation.  Take the narcotic pain medication in addition if needed for severe pain.  Contact  Your Surgeon at [phone], if you have: Pain that will not go away Pain that gets worse A fever of more than 101F (38.3C) Repeated vomiting Swelling, redness, bleeding, or bad-smelling drainage from your wound site Strong abdominal pain No bowel movement or unable to pass gas for 3 days Watery diarrhea lasting longer than 3 days  Pain Control The goal of pain control is to minimize pain, keep you moving and help you heal. Your surgical team will work with you on your pain plan. Most often a combination of therapies and medications are used to control your pain. You may also be given medication (local anesthetic) at the surgical site. This may help control your pain for several days. Extreme pain puts extra stress on your body at a time when your body needs to focus on healing. Do not wait until your pain has reached a level "10" or is unbearable before telling your doctor or nurse. It is much easier to control pain before it becomes severe. Following a laparoscopic procedure, pain is sometimes felt in the shoulder. This is due to the gas inserted into your abdomen during the procedure. Moving and walking helps to decrease the gas and the right shoulder pain.  Use the guide below for ways to manage your post-operative pain. Learn more by going to facs.org/safepaincontrol.  How Intense Is My Pain Common Therapies to Feel Better       I hardly notice my pain, and it does not interfere with my activities.  I notice my pain and it distracts me, but I can still do activities (sitting up, walking, standing).  Non-Medication Therapies  Ice (in a bag, applied over clothing at the surgical site), elevation, rest, meditation, massage, distraction (music, TV, play) walking and mild exercise Splinting the abdomen with pillows +  Non-Opioid Medications Acetaminophen (Tylenol) Non-steroidal anti-inflammatory drugs (NSAIDS) Aspirin, Ibuprofen (Motrin, Advil) Naproxen (Aleve) Take these as  needed, when you feel pain. Both acetaminophen and NSAIDs help to decrease pain and swelling (inflammation).      My pain is hard to ignore and is more noticeable even when I rest.  My pain interferes with my usual activities.  Non-Medication Therapies  +  Non-Opioid medications  Take on a regular schedule (around-the-clock) instead of as needed. (For example, Tylenol every 6 hours at 9:00 am, 3:00 pm, 9:00 pm, 3:00 am and Motrin every 6 hours at 12:00 am, 6:00 am, 12:00 pm, 6:00 pm)         I am focused on my pain, and I am not doing my daily activities.  I am groaning in pain, and I cannot sleep. I am unable to do anything.  My pain is as bad as it could be, and nothing else matters.  Non-Medication Therapies  +  Around-the-Clock Non-Opioid Medications  +  Short-acting opioids  Opioids should be used with other medications to manage severe pain. Opioids block pain and give a feeling of euphoria (feel high). Addiction, a serious side effect of opioids, is   rare with short-term (a few days) use.  Examples of short-acting opioids include: Tramadol (Ultram), Hydrocodone (Norco, Vicodin), Hydromorphone (Dilaudid), Oxycodone (Oxycontin)     The above directions have been adapted from the American College of Surgeons Surgical Patient Education Program.  Please refer to the ACS website if needed: https://www.facs.org/-/media/files/education/patient-ed/adultumbilical.ashx   Khiyan Crace, MD [facility] [address], [address], [place], [place]  [address] ?  [address] [address], , [place]   [address] [phone] ? [phone] ? FAX [phone] Web site: [email or web address]  

## 2022-07-20 NOTE — Anesthesia Postprocedure Evaluation (Signed)
Anesthesia Post Note  Patient: Lindsay Ellis  Procedure(s) Performed: OPEN UMBILICAL HERNIA REPAIR WITH MESH     Patient location during evaluation: PACU Anesthesia Type: General Level of consciousness: awake and alert Pain management: pain level controlled Vital Signs Assessment: post-procedure vital signs reviewed and stable Respiratory status: spontaneous breathing, nonlabored ventilation and respiratory function stable Cardiovascular status: blood pressure returned to baseline and stable Postop Assessment: no apparent nausea or vomiting Anesthetic complications: no   No notable events documented.  Last Vitals:  Vitals:   07/20/22 1136 07/20/22 1145  BP: (!) 151/107 (!) 155/100  Pulse: 89 88  Resp: 13 11  Temp:    SpO2: 100% 98%    Last Pain:  Vitals:   07/20/22 1203  TempSrc:   PainSc: [place]

## 2022-07-20 NOTE — Anesthesia Procedure Notes (Signed)
Procedure Name: Intubation Date/Time: 07/20/2022 9:53 AM  Performed by: Gerald Leitz, CRNAPre-anesthesia Checklist: Patient identified, Patient being monitored, Timeout performed, Emergency Drugs available and Suction available Patient Re-evaluated:Patient Re-evaluated prior to induction Oxygen Delivery Method: Circle system utilized Preoxygenation: Pre-oxygenation with 100% oxygen Induction Type: IV induction Ventilation: Mask ventilation without difficulty Laryngoscope Size: Mac and 3 Grade View: Grade I Tube type: Oral Tube size: 7.0 mm Number of attempts: 1 Airway Equipment and Method: Stylet Placement Confirmation: ETT inserted through vocal cords under direct vision, positive ETCO2 and breath sounds checked- equal and bilateral Secured at: 22 cm Tube secured with: Tape Dental Injury: Teeth and Oropharynx as per pre-operative assessment

## 2022-07-21 ENCOUNTER — Encounter (HOSPITAL_COMMUNITY): Payer: Self-pay | Admitting: Surgery

## 2022-07-27 DIAGNOSIS — E782 Mixed hyperlipidemia: Secondary | ICD-10-CM | POA: Diagnosis not present

## 2022-07-27 DIAGNOSIS — G4733 Obstructive sleep apnea (adult) (pediatric): Secondary | ICD-10-CM | POA: Diagnosis not present

## 2022-07-27 DIAGNOSIS — I10 Essential (primary) hypertension: Secondary | ICD-10-CM | POA: Diagnosis not present

## 2022-07-27 DIAGNOSIS — R5383 Other fatigue: Secondary | ICD-10-CM | POA: Diagnosis not present

## 2022-07-27 DIAGNOSIS — R6889 Other general symptoms and signs: Secondary | ICD-10-CM | POA: Diagnosis not present

## 2022-07-27 DIAGNOSIS — D573 Sickle-cell trait: Secondary | ICD-10-CM | POA: Diagnosis not present

## 2022-07-27 DIAGNOSIS — E669 Obesity, unspecified: Secondary | ICD-10-CM | POA: Diagnosis not present

## 2022-07-27 DIAGNOSIS — E1121 Type 2 diabetes mellitus with diabetic nephropathy: Secondary | ICD-10-CM | POA: Diagnosis not present

## 2022-07-27 DIAGNOSIS — E1165 Type 2 diabetes mellitus with hyperglycemia: Secondary | ICD-10-CM | POA: Diagnosis not present

## 2022-07-28 DIAGNOSIS — R6889 Other general symptoms and signs: Secondary | ICD-10-CM | POA: Diagnosis not present

## 2022-07-28 DIAGNOSIS — D571 Sickle-cell disease without crisis: Secondary | ICD-10-CM | POA: Diagnosis not present

## 2022-07-28 DIAGNOSIS — M65341 Trigger finger, right ring finger: Secondary | ICD-10-CM | POA: Diagnosis not present

## 2022-08-05 DIAGNOSIS — M5416 Radiculopathy, lumbar region: Secondary | ICD-10-CM | POA: Insufficient documentation

## 2022-08-05 DIAGNOSIS — J45909 Unspecified asthma, uncomplicated: Secondary | ICD-10-CM | POA: Insufficient documentation

## 2022-08-05 DIAGNOSIS — K429 Umbilical hernia without obstruction or gangrene: Secondary | ICD-10-CM | POA: Diagnosis not present

## 2022-08-05 DIAGNOSIS — J4489 Other specified chronic obstructive pulmonary disease: Secondary | ICD-10-CM | POA: Insufficient documentation

## 2022-08-05 DIAGNOSIS — Z9889 Other specified postprocedural states: Secondary | ICD-10-CM | POA: Diagnosis not present

## 2022-08-11 DIAGNOSIS — R6889 Other general symptoms and signs: Secondary | ICD-10-CM | POA: Diagnosis not present

## 2022-09-08 DIAGNOSIS — R6889 Other general symptoms and signs: Secondary | ICD-10-CM | POA: Diagnosis not present

## 2022-09-09 DIAGNOSIS — Z9889 Other specified postprocedural states: Secondary | ICD-10-CM | POA: Diagnosis not present

## 2022-09-09 DIAGNOSIS — R6889 Other general symptoms and signs: Secondary | ICD-10-CM | POA: Diagnosis not present

## 2022-09-09 DIAGNOSIS — K429 Umbilical hernia without obstruction or gangrene: Secondary | ICD-10-CM | POA: Diagnosis not present

## 2022-09-29 DIAGNOSIS — R6889 Other general symptoms and signs: Secondary | ICD-10-CM | POA: Diagnosis not present

## 2022-09-29 DIAGNOSIS — G9050 Complex regional pain syndrome I, unspecified: Secondary | ICD-10-CM | POA: Diagnosis not present

## 2022-09-29 DIAGNOSIS — M6530 Trigger finger, unspecified finger: Secondary | ICD-10-CM | POA: Diagnosis not present

## 2022-09-29 DIAGNOSIS — E669 Obesity, unspecified: Secondary | ICD-10-CM | POA: Diagnosis not present

## 2022-09-29 DIAGNOSIS — D573 Sickle-cell trait: Secondary | ICD-10-CM | POA: Diagnosis not present

## 2022-09-29 DIAGNOSIS — M7552 Bursitis of left shoulder: Secondary | ICD-10-CM | POA: Diagnosis not present

## 2022-09-29 DIAGNOSIS — E1165 Type 2 diabetes mellitus with hyperglycemia: Secondary | ICD-10-CM | POA: Diagnosis not present

## 2022-12-08 DIAGNOSIS — K429 Umbilical hernia without obstruction or gangrene: Secondary | ICD-10-CM | POA: Diagnosis not present

## 2022-12-08 DIAGNOSIS — Z9889 Other specified postprocedural states: Secondary | ICD-10-CM | POA: Diagnosis not present

## 2022-12-08 DIAGNOSIS — R6889 Other general symptoms and signs: Secondary | ICD-10-CM | POA: Diagnosis not present

## 2023-01-04 ENCOUNTER — Ambulatory Visit (INDEPENDENT_AMBULATORY_CARE_PROVIDER_SITE_OTHER): Payer: Medicare HMO | Admitting: Nurse Practitioner

## 2023-01-04 ENCOUNTER — Encounter: Payer: Self-pay | Admitting: Nurse Practitioner

## 2023-01-04 VITALS — BP 128/78 | HR 96 | Ht 64.0 in | Wt 195.8 lb

## 2023-01-04 DIAGNOSIS — Z79899 Other long term (current) drug therapy: Secondary | ICD-10-CM

## 2023-01-04 DIAGNOSIS — E1121 Type 2 diabetes mellitus with diabetic nephropathy: Secondary | ICD-10-CM | POA: Diagnosis not present

## 2023-01-04 DIAGNOSIS — E782 Mixed hyperlipidemia: Secondary | ICD-10-CM | POA: Diagnosis not present

## 2023-01-04 DIAGNOSIS — I10 Essential (primary) hypertension: Secondary | ICD-10-CM | POA: Diagnosis not present

## 2023-01-04 DIAGNOSIS — J4541 Moderate persistent asthma with (acute) exacerbation: Secondary | ICD-10-CM | POA: Diagnosis not present

## 2023-01-04 DIAGNOSIS — B9689 Other specified bacterial agents as the cause of diseases classified elsewhere: Secondary | ICD-10-CM | POA: Diagnosis not present

## 2023-01-04 DIAGNOSIS — K2100 Gastro-esophageal reflux disease with esophagitis, without bleeding: Secondary | ICD-10-CM | POA: Diagnosis not present

## 2023-01-04 DIAGNOSIS — R6889 Other general symptoms and signs: Secondary | ICD-10-CM | POA: Diagnosis not present

## 2023-01-04 DIAGNOSIS — J069 Acute upper respiratory infection, unspecified: Secondary | ICD-10-CM

## 2023-01-04 DIAGNOSIS — E1165 Type 2 diabetes mellitus with hyperglycemia: Secondary | ICD-10-CM

## 2023-01-04 DIAGNOSIS — E559 Vitamin D deficiency, unspecified: Secondary | ICD-10-CM

## 2023-01-04 LAB — LP+LDL DIRECT

## 2023-01-04 MED ORDER — AZITHROMYCIN 250 MG PO TABS: ORAL_TABLET | ORAL | 0 refills | Status: AC

## 2023-01-04 MED ORDER — MELOXICAM 15 MG PO TABS: 15.0000 mg | ORAL_TABLET | Freq: Every day | ORAL | 3 refills | Status: DC

## 2023-01-04 MED ORDER — RABEPRAZOLE SODIUM 20 MG PO TBEC: 20.0000 mg | DELAYED_RELEASE_TABLET | Freq: Every day | ORAL | 3 refills | Status: DC

## 2023-01-04 MED ORDER — MECLIZINE HCL 25 MG PO TABS: 25.0000 mg | ORAL_TABLET | Freq: Three times a day (TID) | ORAL | 3 refills | Status: DC

## 2023-01-04 MED ORDER — AMITRIPTYLINE HCL 100 MG PO TABS: 100.0000 mg | ORAL_TABLET | Freq: Every day | ORAL | 3 refills | Status: DC

## 2023-01-04 MED ORDER — GABAPENTIN 300 MG PO CAPS: 1500.0000 mg | ORAL_CAPSULE | Freq: Three times a day (TID) | ORAL | 3 refills | Status: DC

## 2023-01-04 MED ORDER — TIRZEPATIDE 5 MG/0.5ML ~~LOC~~ SOAJ: 5.0000 mg | SUBCUTANEOUS | 0 refills | Status: DC

## 2023-01-04 MED ORDER — ATORVASTATIN CALCIUM 20 MG PO TABS: 20.0000 mg | ORAL_TABLET | Freq: Every day | ORAL | 3 refills | Status: DC

## 2023-01-04 MED ORDER — JARDIANCE 25 MG PO TABS: 25.0000 mg | ORAL_TABLET | Freq: Every day | ORAL | 3 refills | Status: DC

## 2023-01-04 MED ORDER — PROMETHAZINE-DM 6.25-15 MG/5ML PO SYRP: 5.0000 mL | ORAL_SOLUTION | Freq: Three times a day (TID) | ORAL | 0 refills | Status: DC | PRN

## 2023-01-04 MED ORDER — TELMISARTAN 40 MG PO TABS: 40.0000 mg | ORAL_TABLET | Freq: Every day | ORAL | 3 refills | Status: DC

## 2023-01-04 MED ORDER — BREZTRI AEROSPHERE 160-9-4.8 MCG/ACT IN AERO: 2.0000 | INHALATION_SPRAY | Freq: Two times a day (BID) | RESPIRATORY_TRACT | 0 refills | Status: DC

## 2023-01-04 MED ORDER — METFORMIN HCL ER 500 MG PO TB24: 1000.0000 mg | ORAL_TABLET | Freq: Two times a day (BID) | ORAL | 3 refills | Status: DC

## 2023-01-04 MED ORDER — MONTELUKAST SODIUM 10 MG PO TABS: 10.0000 mg | ORAL_TABLET | Freq: Every day | ORAL | 3 refills | Status: DC

## 2023-01-04 MED ORDER — HYDROCHLOROTHIAZIDE 12.5 MG PO TABS: 12.5000 mg | ORAL_TABLET | Freq: Every day | ORAL | 3 refills | Status: DC

## 2023-01-04 MED ORDER — ALBUTEROL SULFATE HFA 108 (90 BASE) MCG/ACT IN AERS: 2.0000 | INHALATION_SPRAY | Freq: Four times a day (QID) | RESPIRATORY_TRACT | 6 refills | Status: DC | PRN

## 2023-01-04 NOTE — Patient Instructions (Signed)
It was a pleasure seeing you today! Thank you for trusting me with your care.   Due to recent changes in healthcare laws, you may see the results of your imaging and laboratory studies on MyChart before I have had a chance to review them. I understand that in some cases there may be results that are confusing or concerning to you. Results typically do not come back at the same time and I often need to wait for multiple results in order to interpret others or have a complete understanding of the situation. Please be assured that I will review your labs and send you comments and recommendations as soon as I have the necessary information to make an informed decision. If you have specific concerns, we can set up an appointment (virtual or in person) to go over details and come up with a plan together.   If you have received any referrals today, the office where the referral was made will be in contact with you to set up your appointment. This may take up to two weeks for some referrals. You may also contact the referral office yourself to see if you can schedule. The information of where the referral was placed is typically on this handout.   If you have received orders for imaging today, the imaging office will contact you to schedule this. Please note that most imaging requires a prior authorization from insurance to ensure insurance will cover their portion. This process can take several days. If insurance declines to cover the imaging, we will be in contact with you to determine next steps.   The only exception to this is x-rays that are sent to [facility]. These are walk-in and do not require an appointment.   If you take regular prescription medications, please contact your pharmacy for routine refill requests. They will send this directly to [place].  If you were ordered new medication as a part of your examination and treatment today, please contact your pharmacy to determine the status. Many  prescriptions require a prior authorization and this process may take several days. If the medication is denied, we will work with you to try alternative medications.  If you are a new patient or are with a new provider, you may need to contact the office the first time refills are required to ensure that the request is received by this office and not another office or provider.   If you have any questions or concerns, please do not hesitate to contact the office via telephone or [place].  MyChart messages are received by the [facility] staff during regular business hours Monday through Friday and we do our best to respond in a timely manner. Please do not use MyChart for urgent messages as there can be a delay of up to 2 business days before your provider can respond.  If your request requires an appointment, the staff will gladly help set that up so that we have the time dedicated to ensure that your questions are appropriately answered.

## 2023-01-04 NOTE — Progress Notes (Unsigned)
Orma Render, DNP, AGNP-c [facility] [address] [place], [place] [address] Main Office [phone]   New patient visit   Patient: Lindsay Ellis   DOB: April 10, 1976   47 y.o. Female  MRN: [id number] Visit Date: 01/04/2023  Patient Care Team: Merrilee Seashore, MD as PCP - General (Internal Medicine)  Today's Vitals   01/04/23 1448  BP: 128/78  Pulse: 96  Weight: 195 lb 12.8 oz (88.8 kg)  Height: '5\' 4"'$  (1.626 m)   Body mass index is 33.61 kg/m.   Today's healthcare provider: Orma Render, NP   Chief Complaint  Patient presents with   other    New pt. Est. No other issues,    Subjective    Lindsay Ellis is a 47 y.o. female who presents today as a new patient to establish care.    Patient endorses the following concerns presently: 10/05/2022 -  covid 10/15/2022- flu   History reviewed and reveals the following: Past Medical History:  Diagnosis Date   Asthma    Diabetes mellitus without complication ([place])    GERD (gastroesophageal reflux disease)    Hyperlipidemia    Hypertension    Osteoarthritis    Sickle cell trait ([facility])    Tuberculosis    positive test TB, treated in 2003   Vertigo    Past Surgical History:  Procedure Laterality Date   CESAREAN SECTION     LUMBAR [place]  [id number]   UMBILICAL HERNIA REPAIR N/A 07/20/2022   Procedure: OPEN UMBILICAL HERNIA REPAIR WITH MESH;  Surgeon: Stechschulte, Nickola Major, MD;  Location: [facility];  Service: General;  Laterality: N/A;   Family Status  Relation Name Status   Mother  (Not Specified)   Family History  Problem Relation Age of Onset   Lung cancer Mother    Social History   Socioeconomic History   Marital status: Single    Spouse name: Not on file   Number of children: Not on file   Years of education: Not on file   Highest education level: Not on file  Occupational History   Not on file  Tobacco Use   Smoking status: Every Day    Packs/day: 0.25    Types: Cigarettes    Smokeless tobacco: Never  Vaping Use   Vaping Use: Never used  Substance and Sexual Activity   Alcohol use: Yes    Comment: occasionally   Drug use: Never   Sexual activity: Not on file  Other Topics Concern   Not on file  Social History Narrative   Not on file   Social Determinants of Health   Financial Resource Strain: Not on file  Food Insecurity: Not on file  Transportation Needs: Not on file  Physical Activity: Not on file  Stress: Not on file  Social Connections: Not on file   Outpatient Medications Prior to Visit  Medication Sig   albuterol (ACCUNEB) 1.25 MG/3ML nebulizer solution Take 1 ampule by nebulization every 6 (six) hours as needed for wheezing.   amitriptyline (ELAVIL) 100 MG tablet Take 100 mg by mouth at bedtime.   atorvastatin (LIPITOR) 20 MG tablet Take 20 mg by mouth daily.   baclofen (LIORESAL) 10 MG tablet Take 30 mg by mouth 3 (three) times daily.   Blood Glucose Monitoring Suppl (ACCU-CHEK AVIVA PLUS) w/Device KIT    cyclobenzaprine (FLEXERIL) 10 MG tablet Take 10 mg by mouth at bedtime.   Dulaglutide (TRULICITY) 1.5 0000000 SOPN Inject 1.5 mg into the  skin every Sunday.   Ferrous Sulfate (IRON PO) Take 1 tablet by mouth daily.   gabapentin (NEURONTIN) 300 MG capsule Take 1,500 mg by mouth 3 (three) times daily.   hydrochlorothiazide (HYDRODIURIL) 12.5 MG tablet Take 12.5 mg by mouth daily.   JARDIANCE 25 MG TABS tablet Take 25 mg by mouth daily.   meclizine (ANTIVERT) 25 MG tablet Take 25 mg by mouth 3 (three) times daily.   meloxicam (MOBIC) 15 MG tablet Take 15 mg by mouth daily.   metFORMIN (GLUCOPHAGE-XR) 500 MG 24 hr tablet Take 1,000 mg by mouth 2 (two) times daily.   montelukast (SINGULAIR) 10 MG tablet Take 10 mg by mouth at bedtime.   Multiple Vitamin (MULTIVITAMIN) tablet Take 1 tablet by mouth daily.   PROAIR HFA 108 (90 Base) MCG/ACT inhaler Inhale 2 puffs into the lungs every 4 (four) hours as needed for wheezing or shortness of  breath.   RABEprazole (ACIPHEX) 20 MG tablet Take 20 mg by mouth daily.   SYMBICORT 160-4.5 MCG/ACT inhaler Inhale 2 puffs into the lungs 2 (two) times daily as needed (shortness of breath).   telmisartan (MICARDIS) 40 MG tablet Take 40 mg by mouth daily.   oxyCODONE-acetaminophen (PERCOCET) 5-325 MG tablet Take 1 tablet by mouth every 4 (four) hours as needed for severe pain. (Patient not taking: Reported on 01/04/2023)   No facility-administered medications prior to visit.   Allergies  Allergen Reactions   Morphine And Related Hives and Itching   Immunization History  Administered Date(s) Administered   PFIZER(Purple Top)SARS-COV-2 Vaccination 03/15/2020, 04/06/2020    Health Maintenance Due Health Maintenance Topics with due status: Overdue     Topic Date Due   FOOT EXAM Never done   OPHTHALMOLOGY EXAM Never done   HIV Screening Never done   Diabetic kidney evaluation - Urine ACR Never done   Hepatitis C Screening Never done   DTaP/Tdap/Td Never done   PAP SMEAR-Modifier Never done   COLONOSCOPY (Pts 45-8yr Insurance coverage will need to be confirmed) Never done   INFLUENZA VACCINE 06/01/2022   Medicare Annual Wellness (AWV) 06/15/2022   COVID-19 Vaccine 07/02/2022    Review of Systems All review of systems negative except what is listed in the HPI   Objective    BP 128/78   Pulse 96   Ht '5\' 4"'$  (1.626 m)   Wt 195 lb 12.8 oz (88.8 kg)   LMP 11/02/2015   BMI 33.61 kg/m  Physical Exam  No results found for any visits on 01/04/23.  Assessment & Plan      Problem List Items Addressed This Visit   None    No follow-ups on file.      Xayden Linsey, SCoralee Pesa NP, DNP, AGNP-C [facility]

## 2023-01-05 LAB — COMPREHENSIVE METABOLIC PANEL
ALT: 29 IU/L (ref 0–32)
AST: 27 IU/L (ref 0–40)
Albumin/Globulin Ratio: 2.2 (ref 1.2–2.2)
Albumin: 3.7 g/dL — ABNORMAL LOW (ref 3.9–4.9)
Alkaline Phosphatase: 80 IU/L (ref 44–121)
BUN/Creatinine Ratio: 16 (ref 9–23)
BUN: 12 mg/dL (ref 6–24)
Bilirubin Total: 0.3 mg/dL (ref 0.0–1.2)
CO2: 23 mmol/L (ref 20–29)
Calcium: 9.1 mg/dL (ref 8.7–10.2)
Chloride: 108 mmol/L — ABNORMAL HIGH (ref 96–106)
Creatinine, Ser: 0.75 mg/dL (ref 0.57–1.00)
Globulin, Total: 1.7 g/dL (ref 1.5–4.5)
Glucose: 146 mg/dL — ABNORMAL HIGH (ref 70–99)
Potassium: 3.8 mmol/L (ref 3.5–5.2)
Sodium: 144 mmol/L (ref 134–144)
Total Protein: 5.4 g/dL — ABNORMAL LOW (ref 6.0–8.5)
eGFR: 99 mL/min/{1.73_m2} (ref >59)

## 2023-01-05 LAB — CBC WITH DIFFERENTIAL/PLATELET
Basophils Absolute: 0.1 10*3/uL (ref 0.0–0.2)
Basos: 1 %
EOS (ABSOLUTE): 0.5 10*3/uL — ABNORMAL HIGH (ref 0.0–0.4)
Eos: 4 %
Hematocrit: 43.7 % (ref 34.0–46.6)
Hemoglobin: 14.5 g/dL (ref 11.1–15.9)
Immature Grans (Abs): 0 10*3/uL (ref 0.0–0.1)
Immature Granulocytes: 0 %
Lymphocytes Absolute: 2.8 10*3/uL (ref 0.7–3.1)
Lymphs: 23 %
MCH: 28.7 pg (ref 26.6–33.0)
MCHC: 33.2 g/dL (ref 31.5–35.7)
MCV: 86 fL (ref 79–97)
Monocytes Absolute: 0.7 10*3/uL (ref 0.1–0.9)
Monocytes: 6 %
Neutrophils Absolute: 8 10*3/uL — ABNORMAL HIGH (ref 1.4–7.0)
Neutrophils: 66 %
Platelets: 319 10*3/uL (ref 150–450)
RBC: 5.06 x10E6/uL (ref 3.77–5.28)
RDW: 14.4 % (ref 11.7–15.4)
WBC: 12.1 10*3/uL — ABNORMAL HIGH (ref 3.4–10.8)

## 2023-01-05 LAB — LP+LDL DIRECT
Cholesterol, Total: 170 mg/dL (ref 100–199)
HDL: 53 mg/dL (ref >39)
LDL Chol Calc (NIH): 81 mg/dL (ref 0–99)
LDL Direct: 86 mg/dL (ref 0–99)
Triglycerides: 214 mg/dL — ABNORMAL HIGH (ref 0–149)
VLDL Cholesterol Cal: 36 mg/dL (ref 5–40)

## 2023-01-05 LAB — VITAMIN D 25 HYDROXY (VIT D DEFICIENCY, FRACTURES): Vit D, 25-Hydroxy: 16.4 ng/mL — ABNORMAL LOW (ref 30.0–100.0)

## 2023-01-05 LAB — B12 AND FOLATE PANEL
Folate: 5.3 ng/mL (ref >3.0)
Vitamin B-12: 332 pg/mL (ref 232–1245)

## 2023-01-05 LAB — HEMOGLOBIN A1C
Est. average glucose Bld gHb Est-mCnc: 120 mg/dL
Hgb A1c MFr Bld: 5.8 % — ABNORMAL HIGH (ref 4.8–5.6)

## 2023-01-05 LAB — TSH: TSH: 1.67 u[IU]/mL (ref 0.450–4.500)

## 2023-01-06 NOTE — Assessment & Plan Note (Signed)
>>  ASSESSMENT AND PLAN FOR ASTHMA WRITTEN ON 01/06/2023  7:50 PM BY Danika Kluender E, NP  The patient presents with a chronic cough persisting for approximately one month, characterized by yellow, thick mucus production and an exacerbation of asthma symptoms. No fevers or chills have been reported. Auscultation reveals crackles in the chest, suggesting possible lower respiratory tract involvement. Asthma control appears suboptimal, contributing to the persistence of the cough. Plan: - Initiate treatment with azithromycin  to target potential bacterial infection. - Provide Breztri  samples for twice-daily use over one month; instruct the patient to discontinue Symbicort during this period. - Dispense Mucinex to aid in mucus expectoration. - Prescribe promethazine -containing cough syrup for nighttime use to alleviate cough and promote sleep. - Advise the use of albuterol  inhaler for acute asthma relief as needed. - Schedule a follow-up appointment if there is no improvement in symptoms by the following week.

## 2023-01-06 NOTE — Assessment & Plan Note (Signed)
Chronic medical condition, currently on atorvastatin.  Plan: - labs today - continue current therapy.

## 2023-01-06 NOTE — Assessment & Plan Note (Signed)
BMI 33.61 today. Discussion on exercise, diet, and medication options that may help address this in addition to improved control of blood sugar. Will see if we can get approval for mounjaro today.

## 2023-01-06 NOTE — Assessment & Plan Note (Signed)
The patient reports recurrent yeast infections, which may be associated with Jardiance use, despite well-controlled A1c levels. The patient is currently on a regimen including Jardiance, Trulicity, and Metformin. Plan: - Consider transitioning from Trulicity to [facility] to potentially enhance glycemic control with fewer adverse effects. - Evaluate the ongoing yeast infections in the context of Jardiance therapy and explore alternative treatment options if the issue persists.

## 2023-01-06 NOTE — Assessment & Plan Note (Signed)
The patient presents with a chronic cough persisting for approximately one month, characterized by yellow, thick mucus production and an exacerbation of asthma symptoms. No fevers or chills have been reported. Auscultation reveals crackles in the chest, suggesting possible lower respiratory tract involvement. Asthma control appears suboptimal, contributing to the persistence of the cough. Plan: - Initiate treatment with azithromycin to target potential bacterial infection. - Provide Breztri samples for twice-daily use over one month; instruct the patient to discontinue Symbicort during this period. - Dispense Mucinex to aid in mucus expectoration. - Prescribe promethazine-containing cough syrup for nighttime use to alleviate cough and promote sleep. - Advise the use of albuterol inhaler for acute asthma relief as needed. - Schedule a follow-up appointment if there is no improvement in symptoms by the following week.

## 2023-01-07 MED ORDER — VITAMIN D3 1.25 MG (50000 UT) PO TABS: 1.0000 | ORAL_TABLET | ORAL | 1 refills | Status: DC

## 2023-01-07 NOTE — Addendum Note (Signed)
Addended by: Jayana Kotula, Clarise Cruz E on: 01/07/2023 06:33 PM   Modules accepted: Orders

## 2023-01-31 ENCOUNTER — Other Ambulatory Visit: Payer: Self-pay | Admitting: Nurse Practitioner

## 2023-01-31 DIAGNOSIS — Z8742 Personal history of other diseases of the female genital tract: Secondary | ICD-10-CM | POA: Diagnosis not present

## 2023-01-31 DIAGNOSIS — Z01419 Encounter for gynecological examination (general) (routine) without abnormal findings: Secondary | ICD-10-CM | POA: Diagnosis not present

## 2023-01-31 DIAGNOSIS — Z30431 Encounter for routine checking of intrauterine contraceptive device: Secondary | ICD-10-CM | POA: Diagnosis not present

## 2023-01-31 DIAGNOSIS — Z1231 Encounter for screening mammogram for malignant neoplasm of breast: Secondary | ICD-10-CM

## 2023-02-01 ENCOUNTER — Telehealth: Payer: Self-pay

## 2023-02-01 NOTE — Telephone Encounter (Signed)
Called pt regarding vm she left for a gabapentin PA. PA Authorization starting on 11/01/2022 and ending on 11/01/2023.

## 2023-02-03 ENCOUNTER — Ambulatory Visit
Admission: RE | Admit: 2023-02-03 | Discharge: 2023-02-03 | Disposition: A | Discharge status: Home / Self Care | Payer: Medicare HMO | Source: Ambulatory Visit | Attending: Nurse Practitioner | Admitting: Nurse Practitioner

## 2023-02-03 DIAGNOSIS — Z1231 Encounter for screening mammogram for malignant neoplasm of breast: Secondary | ICD-10-CM

## 2023-02-03 DIAGNOSIS — R6889 Other general symptoms and signs: Secondary | ICD-10-CM | POA: Diagnosis not present

## 2023-02-03 LAB — HM MAMMOGRAPHY

## 2023-02-08 ENCOUNTER — Telehealth: Payer: Self-pay | Admitting: Nurse Practitioner

## 2023-02-08 NOTE — Telephone Encounter (Signed)
PA GABAPENTIN quantity limits was approved 1350/90 til 11/01/23

## 2023-02-23 ENCOUNTER — Telehealth: Payer: Self-pay | Admitting: Nurse Practitioner

## 2023-02-23 NOTE — Telephone Encounter (Signed)
Contacted Lindsay Ellis to schedule their annual wellness visit. Appointment made for 03/15/23.  Lindsay Ellis AWV direct phone # [phone]

## 2023-03-15 ENCOUNTER — Ambulatory Visit (INDEPENDENT_AMBULATORY_CARE_PROVIDER_SITE_OTHER): Payer: Medicare HMO

## 2023-03-15 VITALS — Ht 62.0 in | Wt 184.0 lb

## 2023-03-15 DIAGNOSIS — Z0000 Encounter for general adult medical examination without abnormal findings: Secondary | ICD-10-CM

## 2023-03-15 NOTE — Patient Instructions (Signed)
[name] , Thank you for taking time to come for your Medicare Wellness Visit. I appreciate your ongoing commitment to your health goals. Please review the following plan we discussed and let me know if I can assist you in the future.   These are the goals we discussed:  Goals      Patient Stated     03/15/2023, wants to lose more weight and quit smoking        This is a list of the screening recommended for you and due dates:  Health Maintenance  Topic Date Due   Complete foot exam   Never done   Eye exam for diabetics  Never done   HIV Screening  Never done   Yearly kidney health urinalysis for diabetes  Never done   Hepatitis C Screening: USPSTF Recommendation to screen - Ages 18-79 yo.  Never done   Pap Smear  Never done   COVID-19 Vaccine (3 - Pfizer risk series) 05/04/2020   Colon Cancer Screening  Never done   Flu Shot  06/02/2023   Hemoglobin A1C  07/07/2023   DTaP/Tdap/Td vaccine (3 - Td or Tdap) 11/13/2023   Yearly kidney function blood test for diabetes  01/04/2024   Medicare Annual Wellness Visit  03/14/2024   HPV Vaccine  Aged Out    Advanced directives: Advance directive discussed with you today.   Conditions/risks identified: smoking  Next appointment: Follow up in one year for your annual wellness visit.   Preventive Care 40-64 Years, Female Preventive care refers to lifestyle choices and visits with your health care provider that can promote health and wellness. What does preventive care include? A yearly physical exam. This is also called an annual well check. Dental exams once or twice a year. Routine eye exams. Ask your health care provider how often you should have your eyes checked. Personal lifestyle choices, including: Daily care of your teeth and gums. Regular physical activity. Eating a healthy diet. Avoiding tobacco and drug use. Limiting alcohol use. Practicing safe sex. Taking low-dose aspirin daily starting at age 16. Taking vitamin and  mineral supplements as recommended by your health care provider. What happens during an annual well check? The services and screenings done by your health care provider during your annual well check will depend on your age, overall health, lifestyle risk factors, and family history of disease. Counseling  Your health care provider may ask you questions about your: Alcohol use. Tobacco use. Drug use. Emotional well-being. Home and relationship well-being. Sexual activity. Eating habits. Work and work Astronomer. Method of birth control. Menstrual cycle. Pregnancy history. Screening  You may have the following tests or measurements: Height, weight, and BMI. Blood pressure. Lipid and cholesterol levels. These may be checked every 5 years, or more frequently if you are over 49 years old. Skin check. Lung cancer screening. You may have this screening every year starting at age 46 if you have a 30-pack-year history of smoking and currently smoke or have quit within the past 15 years. Fecal occult blood test (FOBT) of the stool. You may have this test every year starting at age 6. Flexible sigmoidoscopy or colonoscopy. You may have a sigmoidoscopy every 5 years or a colonoscopy every 10 years starting at age 48. Hepatitis C blood test. Hepatitis B blood test. Sexually transmitted disease (STD) testing. Diabetes screening. This is done by checking your blood sugar (glucose) after you have not eaten for a while (fasting). You may have this done every 1-3  years. Mammogram. This may be done every 1-2 years. Talk to your health care provider about when you should start having regular mammograms. This may depend on whether you have a family history of breast cancer. BRCA-related cancer screening. This may be done if you have a family history of breast, ovarian, tubal, or peritoneal cancers. Pelvic exam and Pap test. This may be done every 3 years starting at age 77. Starting at age 74, this may  be done every 5 years if you have a Pap test in combination with an HPV test. Bone density scan. This is done to screen for osteoporosis. You may have this scan if you are at high risk for osteoporosis. Discuss your test results, treatment options, and if necessary, the need for more tests with your health care provider. Vaccines  Your health care provider may recommend certain vaccines, such as: Influenza vaccine. This is recommended every year. Tetanus, diphtheria, and acellular pertussis (Tdap, Td) vaccine. You may need a Td booster every 10 years. Zoster vaccine. You may need this after age 70. Pneumococcal 13-valent conjugate (PCV13) vaccine. You may need this if you have certain conditions and were not previously vaccinated. Pneumococcal polysaccharide (PPSV23) vaccine. You may need one or two doses if you smoke cigarettes or if you have certain conditions. Talk to your health care provider about which screenings and vaccines you need and how often you need them. This information is not intended to replace advice given to you by your health care provider. Make sure you discuss any questions you have with your health care provider. Document Released: 11/14/2015 Document Revised: 07/07/2016 Document Reviewed: 08/19/2015 Elsevier Interactive Patient Education  2017 [employer].    Fall Prevention in the Home Falls can cause injuries. They can happen to people of all ages. There are many things you can do to make your home safe and to help prevent falls. What can I do on the outside of my home? Regularly fix the edges of walkways and driveways and fix any cracks. Remove anything that might make you trip as you walk through a door, such as a raised step or threshold. Trim any bushes or trees on the path to your home. Use bright outdoor lighting. Clear any walking paths of anything that might make someone trip, such as rocks or tools. Regularly check to see if handrails are loose or  broken. Make sure that both sides of any steps have handrails. Any raised decks and porches should have guardrails on the edges. Have any leaves, snow, or ice cleared regularly. Use sand or salt on walking paths during winter. Clean up any spills in your garage right away. This includes oil or grease spills. What can I do in the bathroom? Use night lights. Install grab bars by the toilet and in the tub and shower. Do not use towel bars as grab bars. Use non-skid mats or decals in the tub or shower. If you need to sit down in the shower, use a plastic, non-slip stool. Keep the floor dry. Clean up any water that spills on the floor as soon as it happens. Remove soap buildup in the tub or shower regularly. Attach bath mats securely with double-sided non-slip rug tape. Do not have throw rugs and other things on the floor that can make you trip. What can I do in the bedroom? Use night lights. Make sure that you have a light by your bed that is easy to reach. Do not use any sheets or blankets  that are too big for your bed. They should not hang down onto the floor. Have a firm chair that has side arms. You can use this for support while you get dressed. Do not have throw rugs and other things on the floor that can make you trip. What can I do in the kitchen? Clean up any spills right away. Avoid walking on wet floors. Keep items that you use a lot in easy-to-reach places. If you need to reach something above you, use a strong step stool that has a grab bar. Keep electrical cords out of the way. Do not use floor polish or wax that makes floors slippery. If you must use wax, use non-skid floor wax. Do not have throw rugs and other things on the floor that can make you trip. What can I do with my stairs? Do not leave any items on the stairs. Make sure that there are handrails on both sides of the stairs and use them. Fix handrails that are broken or loose. Make sure that handrails are as long as  the stairways. Check any carpeting to make sure that it is firmly attached to the stairs. Fix any carpet that is loose or worn. Avoid having throw rugs at the top or bottom of the stairs. If you do have throw rugs, attach them to the floor with carpet tape. Make sure that you have a light switch at the top of the stairs and the bottom of the stairs. If you do not have them, ask someone to add them for you. What else can I do to help prevent falls? Wear shoes that: Do not have high heels. Have rubber bottoms. Are comfortable and fit you well. Are closed at the toe. Do not wear sandals. If you use a stepladder: Make sure that it is fully opened. Do not climb a closed stepladder. Make sure that both sides of the stepladder are locked into place. Ask someone to hold it for you, if possible. Clearly mark and make sure that you can see: Any grab bars or handrails. First and last steps. Where the edge of each step is. Use tools that help you move around (mobility aids) if they are needed. These include: Canes. Walkers. Scooters. Crutches. Turn on the lights when you go into a dark area. Replace any light bulbs as soon as they burn out. Set up your furniture so you have a clear path. Avoid moving your furniture around. If any of your floors are uneven, fix them. If there are any pets around you, be aware of where they are. Review your medicines with your doctor. Some medicines can make you feel dizzy. This can increase your chance of falling. Ask your doctor what other things that you can do to help prevent falls. This information is not intended to replace advice given to you by your health care provider. Make sure you discuss any questions you have with your health care provider. Document Released: 08/14/2009 Document Revised: 03/25/2016 Document Reviewed: 11/22/2014 Elsevier Interactive Patient Education  2017 [employer].

## 2023-03-15 NOTE — Progress Notes (Signed)
I connected with  Leticia Penna on 03/15/23 by a audio enabled telemedicine application and verified that I am speaking with the correct person using two identifiers.  Patient Location: Home  Provider Location: Office/Clinic  I discussed the limitations of evaluation and management by telemedicine. The patient expressed understanding and agreed to proceed.  Subjective:   Lindsay Ellis is a 47 y.o. female who presents for Medicare Annual (Subsequent) preventive examination.  Review of Systems     Cardiac Risk Factors include: diabetes mellitus;dyslipidemia;hypertension;obesity (BMI >30kg/m2);smoking/ tobacco exposure     Objective:    Today's Vitals   03/15/23 1012  Weight: 184 lb (83.5 kg)  Height: 5\' 2"  (1.575 m)  PainSc: 4    Body mass index is 33.65 kg/m.     03/15/2023   10:17 AM 07/07/2022    8:00 AM 03/01/2018   11:55 PM  Advanced Directives  Does Patient Have a Medical Advance Directive? No No No  Would patient like information on creating a medical advance directive?  Yes (MAU/Ambulatory/Procedural Areas - Information given) Yes (MAU/Ambulatory/Procedural Areas - Information given)    Current Medications (verified) Outpatient Encounter Medications as of 03/15/2023  Medication Sig   albuterol (ACCUNEB) 1.25 MG/3ML nebulizer solution Take 1 ampule by nebulization every 6 (six) hours as needed for wheezing.   albuterol (VENTOLIN HFA) 108 (90 Base) MCG/ACT inhaler Inhale 2 puffs into the lungs every 6 (six) hours as needed for wheezing or shortness of breath.   amitriptyline (ELAVIL) 100 MG tablet Take 1 tablet (100 mg total) by mouth at bedtime.   atorvastatin (LIPITOR) 20 MG tablet Take 1 tablet (20 mg total) by mouth daily.   Blood Glucose Monitoring Suppl (ACCU-CHEK AVIVA PLUS) w/Device KIT    Budeson-Glycopyrrol-Formoterol (BREZTRI AEROSPHERE) 160-9-4.8 MCG/ACT AERO Inhale 2 puffs into the lungs 2 (two) times daily.   Cholecalciferol (VITAMIN D3) 1.25 MG (50000 UT)  TABS Take 1 tablet by mouth once a week.   Ferrous Sulfate (IRON PO) Take 1 tablet by mouth daily.   gabapentin (NEURONTIN) 300 MG capsule Take 5 capsules (1,500 mg total) by mouth 3 (three) times daily.   hydrochlorothiazide (HYDRODIURIL) 12.5 MG tablet Take 1 tablet (12.5 mg total) by mouth daily.   JARDIANCE 25 MG TABS tablet Take 1 tablet (25 mg total) by mouth daily.   meclizine (ANTIVERT) 25 MG tablet Take 1 tablet (25 mg total) by mouth 3 (three) times daily.   meloxicam (MOBIC) 15 MG tablet Take 1 tablet (15 mg total) by mouth daily.   metFORMIN (GLUCOPHAGE-XR) 500 MG 24 hr tablet Take 2 tablets (1,000 mg total) by mouth 2 (two) times daily.   montelukast (SINGULAIR) 10 MG tablet Take 1 tablet (10 mg total) by mouth at bedtime.   Multiple Vitamin (MULTIVITAMIN) tablet Take 1 tablet by mouth daily.   promethazine-dextromethorphan (PROMETHAZINE-DM) 6.25-15 MG/5ML syrup Take 5 mLs by mouth 3 (three) times daily as needed for cough.   RABEprazole (ACIPHEX) 20 MG tablet Take 1 tablet (20 mg total) by mouth daily.   SYMBICORT 160-4.5 MCG/ACT inhaler Inhale 2 puffs into the lungs 2 (two) times daily as needed (shortness of breath).   telmisartan (MICARDIS) 40 MG tablet Take 1 tablet (40 mg total) by mouth daily.   tirzepatide [facility]) 5 MG/0.5ML Pen Inject 5 mg into the skin once a week.   No facility-administered encounter medications on file as of 03/15/2023.    Allergies (verified) Morphine and related   History: Past Medical History:  Diagnosis Date  Asthma    Diabetes mellitus without complication (HCC)    GERD (gastroesophageal reflux disease)    Hyperlipidemia    Hypertension    Osteoarthritis    Sickle cell trait (HCC)    Tuberculosis    positive test TB, treated in 2003   Vertigo    Past Surgical History:  Procedure Laterality Date   CESAREAN SECTION     LUMBAR DISC SURGERY  2011   UMBILICAL HERNIA REPAIR N/A 07/20/2022   Procedure: OPEN UMBILICAL HERNIA REPAIR  WITH MESH;  Surgeon: Stechschulte, Hyman Hopes, MD;  Location: [facility];  Service: General;  Laterality: N/A;   Family History  Problem Relation Age of Onset   Lung cancer Mother    Social History   Socioeconomic History   Marital status: Single    Spouse name: Not on file   Number of children: Not on file   Years of education: Not on file   Highest education level: Not on file  Occupational History   Not on file  Tobacco Use   Smoking status: Every Day    Packs/day: .25    Types: Cigarettes   Smokeless tobacco: Never  Vaping Use   Vaping Use: Never used  Substance and Sexual Activity   Alcohol use: Yes    Comment: occasionally   Drug use: Never   Sexual activity: Not on file  Other Topics Concern   Not on file  Social History Narrative   Not on file   Social Determinants of Health   Financial Resource Strain: Low Risk  (03/15/2023)   Overall Financial Resource Strain (CARDIA)    Difficulty of Paying Living Expenses: Not hard at all  Food Insecurity: No Food Insecurity (03/15/2023)   Hunger Vital Sign    Worried About Running Out of Food in the Last Year: Never true    Ran Out of Food in the Last Year: Never true  Transportation Needs: No Transportation Needs (03/15/2023)   PRAPARE - Administrator, Civil Service (Medical): No    Lack of Transportation (Non-Medical): No  Physical Activity: Inactive (03/15/2023)   Exercise Vital Sign    Days of Exercise per Week: 0 days    Minutes of Exercise per Session: 0 min  Stress: No Stress Concern Present (03/15/2023)   [employer] of Occupational Health - Occupational Stress Questionnaire    Feeling of Stress : Not at all  Social Connections: Not on file    Tobacco Counseling Ready to quit: Yes Counseling given: Not Answered   Clinical Intake:  Pre-visit preparation completed: Yes  Pain : 0-10 Pain Score: 4  Pain Type: Chronic pain Pain Location: Generalized Pain Descriptors / Indicators: Aching Pain  Onset: More than a month ago Pain Frequency: Constant     Nutritional Status: BMI > 30  Obese Nutritional Risks: None Diabetes: Yes  How often do you need to have someone help you when you read instructions, pamphlets, or other written materials from your doctor or pharmacy?: 1 - Never  Diabetic? Yes Nutrition Risk Assessment:  Has the patient had any N/V/D within the last 2 months?  No  Does the patient have any non-healing wounds?  No  Has the patient had any unintentional weight loss or weight gain?  No   Diabetes:  Is the patient diabetic?  Yes  If diabetic, was a CBG obtained today?  No  Did the patient bring in their glucometer from home?  No  How often do you monitor your  CBG's? Twice daily.   Financial Strains and Diabetes Management:  Are you having any financial strains with the device, your supplies or your medication? No .  Does the patient want to be seen by Chronic Care Management for management of their diabetes?  No  Would the patient like to be referred to a Nutritionist or for Diabetic Management?  No   Diabetic Exams:  Diabetic Eye Exam: Overdue for diabetic eye exam. Pt has been advised about the importance in completing this exam. Patient advised to call and schedule an eye exam. Diabetic Foot Exam: Overdue, Pt has been advised about the importance in completing this exam. Pt is scheduled for diabetic foot exam on next appointment.   Interpreter Needed?: No  Information entered by :: NAllen LPN   Activities of Daily Living    03/15/2023   10:19 AM 07/07/2022    8:02 AM  In your present state of health, do you have any difficulty performing the following activities:  Hearing? 0   Vision? 1   Difficulty concentrating or making decisions? 0   Walking or climbing stairs? 1   Dressing or bathing? 0   Doing errands, shopping? 0 0  Preparing Food and eating ? N   Using the Toilet? N   In the past six months, have you accidently leaked urine? Y   Do  you have problems with loss of bowel control? N   Managing your Medications? N   Managing your Finances? N   Housekeeping or managing your Housekeeping? N     Patient Care Team: Early, Sung Amabile, NP as PCP - General (Nurse Practitioner)  Indicate any recent Medical Services you may have received from other than [facility] providers in the past year (date may be approximate).     Assessment:   This is a routine wellness examination for [place].  Hearing/Vision screen Vision Screening - Comments:: Regular eye exams, [facility]  Dietary issues and exercise activities discussed: Current Exercise Habits: The patient does not participate in regular exercise at present   Goals Addressed             This Visit's Progress    Patient Stated       03/15/2023, wants to lose more weight and quit smoking       Depression Screen    03/15/2023   10:18 AM 01/04/2023    2:48 PM 04/25/2018    2:18 PM 03/01/2018   11:55 PM  PHQ 2/9 Scores  PHQ - 2 Score 0 0 0 0  PHQ- 9 Score 1       Fall Risk    03/15/2023   10:18 AM 01/04/2023    2:48 PM 04/25/2018    2:18 PM 03/01/2018   11:55 PM  Fall Risk   Falls in the past year? 0 0 No No  Number falls in past yr: 0 0    Injury with Fall? 0 0    Risk for fall due to : Medication side effect No Fall Risks    Follow up Falls prevention discussed;Education provided;Falls evaluation completed Falls evaluation completed      FALL RISK PREVENTION PERTAINING TO THE HOME:  Any stairs in or around the home? Yes  If so, are there any without handrails? No  Home free of loose throw rugs in walkways, pet beds, electrical cords, etc? Yes  Adequate lighting in your home to reduce risk of falls? Yes   ASSISTIVE DEVICES UTILIZED TO PREVENT FALLS:  Life  alert? No  Use of a cane, walker or w/c? No  Grab bars in the bathroom? No  Shower chair or bench in shower? No  Elevated toilet seat or a handicapped toilet? No   TIMED UP AND GO:  Was the test  performed? No .      Cognitive Function:        03/15/2023   10:20 AM  6CIT Screen  What Year? 0 points  What month? 0 points  What time? 0 points  Count back from 20 0 points  Months in reverse 0 points  Repeat phrase 2 points  Total Score 2 points    Immunizations Immunization History  Administered Date(s) Administered   Influenza,inj,Quad PF,6+ Mos 11/09/2012, 11/12/2013, 09/16/2014, 12/09/2015, 09/15/2016   Influenza-Unspecified 10/07/2011   PFIZER(Purple Top)SARS-COV-2 Vaccination 03/15/2020, 04/06/2020   Pneumococcal Polysaccharide-23 02/03/2015   Pneumococcal-Unspecified 06/17/2008   Td 08/26/2005   Tdap 11/12/2013    TDAP status: Up to date  Flu Vaccine status: Up to date  Pneumococcal vaccine status: Up to date  Covid-19 vaccine status: Completed vaccines  Qualifies for Shingles Vaccine? No   Zostavax completed  n/a   Shingrix Completed?: n/a  Screening Tests Health Maintenance  Topic Date Due   FOOT EXAM  Never done   OPHTHALMOLOGY EXAM  Never done   HIV Screening  Never done   Diabetic kidney evaluation - Urine ACR  Never done   Hepatitis C Screening  Never done   PAP SMEAR-Modifier  Never done   COVID-19 Vaccine (3 - Pfizer risk series) 05/04/2020   COLONOSCOPY (Pts 45-42yrs Insurance coverage will need to be confirmed)  Never done   Medicare Annual Wellness (AWV)  06/15/2022   INFLUENZA VACCINE  06/02/2023   HEMOGLOBIN A1C  07/07/2023   DTaP/Tdap/Td (3 - Td or Tdap) 11/13/2023   Diabetic kidney evaluation - eGFR measurement  01/04/2024   HPV VACCINES  Aged Out    Health Maintenance  Health Maintenance Due  Topic Date Due   FOOT EXAM  Never done   OPHTHALMOLOGY EXAM  Never done   HIV Screening  Never done   Diabetic kidney evaluation - Urine ACR  Never done   Hepatitis C Screening  Never done   PAP SMEAR-Modifier  Never done   COVID-19 Vaccine (3 - Pfizer risk series) 05/04/2020   COLONOSCOPY (Pts 45-38yrs Insurance coverage will  need to be confirmed)  Never done   Medicare Annual Wellness (AWV)  06/15/2022    Colorectal cancer screening: states had last year   Mammogram status: Completed 02/03/2023. Repeat every year  Bone Density status: n/a  Lung Cancer Screening: (Low Dose CT Chest recommended if Age 37-80 years, 30 pack-year currently smoking OR have quit w/in 15years.) does not qualify.   Lung Cancer Screening Referral: no  Additional Screening:  Hepatitis C Screening: does qualify;   Vision Screening: Recommended annual ophthalmology exams for early detection of glaucoma and other disorders of the eye. Is the patient up to date with their annual eye exam?  No  Who is the provider or what is the name of the office in which the patient attends annual eye exams? [facility] If pt is not established with a provider, would they like to be referred to a provider to establish care? No .   Dental Screening: Recommended annual dental exams for proper oral hygiene  Community Resource Referral / Chronic Care Management: CRR required this visit?  No   CCM required this visit?  No  Plan:     I have personally reviewed and noted the following in the patient's chart:   Medical and social history Use of alcohol, tobacco or illicit drugs  Current medications and supplements including opioid prescriptions. Patient is not currently taking opioid prescriptions. Functional ability and status Nutritional status Physical activity Advanced directives List of other physicians Hospitalizations, surgeries, and ER visits in previous 12 months Vitals Screenings to include cognitive, depression, and falls Referrals and appointments  In addition, I have reviewed and discussed with patient certain preventive protocols, quality metrics, and best practice recommendations. A written personalized care plan for preventive services as well as general preventive health recommendations were provided to patient.      Barb Merino, LPN   [id number]   Nurse Notes: none  Due to this being a virtual visit, the after visit summary with patients personalized plan was offered to patient via mail or my-chart.  Patient would like to access on my-chart

## 2023-03-21 DIAGNOSIS — H5213 Myopia, bilateral: Secondary | ICD-10-CM | POA: Diagnosis not present

## 2023-03-21 DIAGNOSIS — H524 Presbyopia: Secondary | ICD-10-CM | POA: Diagnosis not present

## 2023-03-25 ENCOUNTER — Other Ambulatory Visit: Payer: Self-pay | Admitting: Nurse Practitioner

## 2023-03-25 DIAGNOSIS — K2100 Gastro-esophageal reflux disease with esophagitis, without bleeding: Secondary | ICD-10-CM

## 2023-03-25 DIAGNOSIS — E1121 Type 2 diabetes mellitus with diabetic nephropathy: Secondary | ICD-10-CM

## 2023-03-25 DIAGNOSIS — E1165 Type 2 diabetes mellitus with hyperglycemia: Secondary | ICD-10-CM

## 2023-03-25 DIAGNOSIS — I10 Essential (primary) hypertension: Secondary | ICD-10-CM

## 2023-03-25 DIAGNOSIS — E782 Mixed hyperlipidemia: Secondary | ICD-10-CM

## 2023-04-07 ENCOUNTER — Ambulatory Visit: Payer: Medicare HMO | Admitting: Nurse Practitioner

## 2023-04-07 ENCOUNTER — Encounter: Payer: Self-pay | Admitting: Nurse Practitioner

## 2023-04-07 VITALS — BP 126/82 | HR 90 | Wt 183.6 lb

## 2023-04-07 DIAGNOSIS — J4551 Severe persistent asthma with (acute) exacerbation: Secondary | ICD-10-CM | POA: Diagnosis not present

## 2023-04-07 DIAGNOSIS — R6889 Other general symptoms and signs: Secondary | ICD-10-CM | POA: Diagnosis not present

## 2023-04-07 MED ORDER — PREDNISONE 20 MG PO TABS: ORAL_TABLET | ORAL | 2 refills | Status: DC

## 2023-04-07 MED ORDER — REVEFENACIN 175 MCG/3ML IN SOLN: 175.0000 ug | Freq: Every day | RESPIRATORY_TRACT | 12 refills | Status: DC

## 2023-04-07 MED ORDER — BUDESONIDE 0.25 MG/2ML IN SUSP: 0.2500 mg | Freq: Every day | RESPIRATORY_TRACT | 12 refills | Status: DC

## 2023-04-07 MED ORDER — IPRATROPIUM BROMIDE 0.02 % IN SOLN: 0.5000 mg | Freq: Four times a day (QID) | RESPIRATORY_TRACT | 12 refills | Status: DC | PRN

## 2023-04-07 MED ORDER — ALBUTEROL SULFATE 1.25 MG/3ML IN NEBU: 1.0000 | INHALATION_SOLUTION | Freq: Four times a day (QID) | RESPIRATORY_TRACT | 12 refills | Status: DC | PRN

## 2023-04-07 NOTE — Progress Notes (Signed)
  Tollie Eth, DNP, AGNP-c [facility] [address] [place], [place] [address] [phone]  Subjective:   Lindsay Ellis is a 47 y.o. female presents to day for evaluation of: Asthma/Respiratory The patient presents with a history of recent asthma attacks. Her nebulizer medication had expired, but she has been using an inhaler sample of Breztri, which has been somewhat helpful. She reports that the weather has been exacerbating her asthma symptoms, and this year has been particularly difficult for her allergies and asthma. She is in need of nebulizer medication refills.  PMH, Medications, and Allergies reviewed and updated in chart as appropriate.   ROS negative except for what is listed in HPI. Objective:  BP 126/82   Pulse 90   Wt 183 lb 9.6 oz (83.3 kg)   LMP 11/02/2015   SpO2 96%   BMI 33.58 kg/m  Physical Exam Vitals and nursing note reviewed.  Constitutional:      Appearance: Normal appearance.  HENT:     Head: Normocephalic.  Eyes:     Pupils: Pupils are equal, round, and reactive to light.  Cardiovascular:     Rate and Rhythm: Normal rate and regular rhythm.     Pulses: Normal pulses.     Heart sounds: Normal heart sounds.  Pulmonary:     Breath sounds: Wheezing present.     Comments: Prolonged expirations present.  Musculoskeletal:        General: Normal range of motion.     Cervical back: Normal range of motion.  Skin:    General: Skin is warm.  Neurological:     General: No focal deficit present.     Mental Status: She is alert and oriented to person, place, and time.  Psychiatric:        Mood and Affect: Mood normal.           Assessment & Plan:   Problem List Items Addressed This Visit     Asthma - Primary    History of asthma with daily smoking history. At this time I strongly suspect she has developed COPD given her symptoms which are consistent with exacerbation as such. I feel that she would benefit from evaluation with  pulmonology for confirmation of diagnosis.  Plan: - OK to continue using Breztri as this has been helpful and history of smoking and asthma symptoms suggests COPD.  - Nebulized albuterol and pulmicort sent to pharmacy.  - Utilize eucrissa, budesonide, and albuterol nebulizer daily for at least the next week to help reduce current exacerbation.  - Consider pulmonology evaluation - Low threshold for oral steroids if symptoms worsen.       Relevant Medications   albuterol (ACCUNEB) 1.25 MG/3ML nebulizer solution   budesonide (PULMICORT) 0.25 MG/2ML nebulizer solution   ipratropium (ATROVENT) 0.02 % nebulizer solution   revefenacin (YUPELRI) 175 MCG/3ML nebulizer solution      Tollie Eth, DNP, AGNP-c 05/29/2023  11:25 PM    History, Medications, Surgery, SDOH, and Family History reviewed and updated as appropriate.

## 2023-05-18 ENCOUNTER — Ambulatory Visit: Payer: Medicare HMO | Admitting: Medical

## 2023-05-18 VITALS — BP 124/84 | HR 106 | Temp 97.5°F | Wt 181.6 lb

## 2023-05-18 DIAGNOSIS — G8929 Other chronic pain: Secondary | ICD-10-CM

## 2023-05-18 DIAGNOSIS — M25512 Pain in left shoulder: Secondary | ICD-10-CM | POA: Diagnosis not present

## 2023-05-18 DIAGNOSIS — M67814 Other specified disorders of tendon, left shoulder: Secondary | ICD-10-CM

## 2023-05-18 NOTE — Progress Notes (Signed)
Subjective:  Lindsay Ellis is a 47 y.o. female who presents for Chief Complaint  Patient presents with   Shoulder Pain    Shoulder pain- been going on a while but getting work, would like a referral to ortho     She is here for shoulder pain.   She has problem with her left shoulder for the past year.  She has seen rheumatology and had steroid injection twice last year by rheumatology.   2nd shot helped a little, but now a problem again.  No prior physical therapy.  Has been seeing Dr. Janyth Contes with [facility].  No numbness or tingling.  Just pain.  She cannot lift her arm much over 90 degrees.  No recent injury or trauma.  She is right-handed, but given some issues she was having over her right arm this past year she has been using her left arm more.  No specific exercise or specific repetitive action with the left arm.  Using ice, uses gabapentin and meloxicam.    No other aggravating or relieving factors.    No other c/o.  Past Medical History:  Diagnosis Date   Asthma    Diabetes mellitus without complication (HCC)    GERD (gastroesophageal reflux disease)    Hyperlipidemia    Hypertension    Osteoarthritis    Sickle cell trait (HCC)    Tuberculosis    positive test TB, treated in 2003   Vertigo    Current Outpatient Medications on File Prior to Visit  Medication Sig Dispense Refill   albuterol (ACCUNEB) 1.25 MG/3ML nebulizer solution Take 3 mLs (1.25 mg total) by nebulization every 6 (six) hours as needed for wheezing. 75 mL 12   albuterol (VENTOLIN HFA) 108 (90 Base) MCG/ACT inhaler Inhale 2 puffs into the lungs every 6 (six) hours as needed for wheezing or shortness of breath. 18 g 6   amitriptyline (ELAVIL) 100 MG tablet Take 1 tablet (100 mg total) by mouth at bedtime. 90 tablet 3   atorvastatin (LIPITOR) 20 MG tablet Take 1 tablet (20 mg total) by mouth daily. 90 tablet 3   Blood Glucose Monitoring Suppl (ACCU-CHEK AVIVA PLUS) w/Device KIT       Budeson-Glycopyrrol-Formoterol (BREZTRI AEROSPHERE) 160-9-4.8 MCG/ACT AERO Inhale 2 puffs into the lungs 2 (two) times daily. 10.7 g 0   budesonide (PULMICORT) 0.25 MG/2ML nebulizer solution Take 2 mLs (0.25 mg total) by nebulization daily. Use with Yupelri and Ipratropium in place of daily inhaler for flairs. 60 mL 12   Cholecalciferol (VITAMIN D3) 1.25 MG (50000 UT) TABS Take 1 tablet by mouth once a week. 12 tablet 1   cyclobenzaprine (FLEXERIL) 10 MG tablet Take 10 mg by mouth 3 (three) times daily as needed for muscle spasms. 1 daily prn     Ferrous Sulfate (IRON PO) Take 1 tablet by mouth daily.     gabapentin (NEURONTIN) 300 MG capsule Take 5 capsules (1,500 mg total) by mouth 3 (three) times daily. 1350 capsule 3   hydrochlorothiazide (HYDRODIURIL) 12.5 MG tablet Take 1 tablet (12.5 mg total) by mouth daily. 90 tablet 3   ipratropium (ATROVENT) 0.02 % nebulizer solution Take 2.5 mLs (0.5 mg total) by nebulization every 6 (six) hours as needed for wheezing or shortness of breath. Use once daily with budesonide and Yupelri in place of Daily inhaler during flairs. 75 mL 12   JARDIANCE 25 MG TABS tablet Take 1 tablet (25 mg total) by mouth daily. 90 tablet 3   meloxicam (MOBIC)  15 MG tablet Take 1 tablet (15 mg total) by mouth daily. 90 tablet 3   metFORMIN (GLUCOPHAGE-XR) 500 MG 24 hr tablet Take 2 tablets (1,000 mg total) by mouth 2 (two) times daily. 360 tablet 3   montelukast (SINGULAIR) 10 MG tablet Take 1 tablet (10 mg total) by mouth at bedtime. 90 tablet 3   Multiple Vitamin (MULTIVITAMIN) tablet Take 1 tablet by mouth daily.     RABEprazole (ACIPHEX) 20 MG tablet Take 1 tablet (20 mg total) by mouth daily. 90 tablet 3   revefenacin (YUPELRI) 175 MCG/3ML nebulizer solution Take 3 mLs (175 mcg total) by nebulization daily. Use with ipratropium and budesonide. 90 mL 12   SYMBICORT 160-4.5 MCG/ACT inhaler Inhale 2 puffs into the lungs 2 (two) times daily as needed (shortness of breath).  6    telmisartan (MICARDIS) 40 MG tablet Take 1 tablet (40 mg total) by mouth daily. 90 tablet 3   tirzepatide (MOUNJARO) 5 MG/0.5ML Pen INJECT 5 MG INTO THE SKIN ONCE A WEEK. 6 mL 0   meclizine (ANTIVERT) 25 MG tablet Take 1 tablet (25 mg total) by mouth 3 (three) times daily. (Patient not taking: Reported on 05/18/2023) 270 tablet 3   No current facility-administered medications on file prior to visit.    The following portions of the patient's history were reviewed and updated as appropriate: allergies, current medications, past family history, past medical history, past social history, past surgical history and problem list.  ROS Otherwise as in subjective above    Objective: BP 124/84   Pulse (!) 106   Temp (!) 97.5 F (36.4 C)   Wt 181 lb 9.6 oz (82.4 kg)   LMP 11/02/2015   BMI 33.22 kg/m   General appearance: alert, no distress, well developed, well nourished Neck nontender with normal range of motion Left upper back paraspinal tenderness and muscle spasm Left shoulder tender at the biceps origin and AC joint, nontender to touch otherwise.  Active range of motion limited to 85 degrees of flexion of the shoulder and abduction, reduced significantly with internal and external range of motion on the left.  I can get her left shoulder passively flex to about 85 degrees.  Negative special test otherwise. Arms neurovascularly intact   MRI left shoulder without contrast 07/04/2022 IMPRESSION: 1. Mild tendinosis of the supraspinatus tendon. 2. Mild tendinosis of the infraspinatus tendon. 3. Severe tendinosis of the intra-articular portion of the long head of the biceps tendon.   Assessment: Encounter Diagnoses  Name Primary?   Tendinosis of left shoulder Yes   Chronic left shoulder pain      Plan: We discussed symptoms and findings.  I reviewed her MRI from last year showing some tendinosis.  She has had 2 steroid injections by rheumatology.  At this point we will refer her to  orthopedics and she will likely need to do some physical therapy.  In the meantime I showed her some Bentyl over rehab exercises she can try in the meantime.  She can continue alternating ice and heat therapy, begin arm sling a couple hours at a time to rest the left arm.  Advised on gentle stretching.  Continue the medication regimen as she is has already been on meloxicam and gabapentin   Mariella "NATURE" was seen today for shoulder pain.  Diagnoses and all orders for this visit:  Tendinosis of left shoulder -     Ambulatory referral to Orthopedics  Chronic left shoulder pain -     Ambulatory  referral to Orthopedics   Follow up: Pending Ortho

## 2023-05-29 ENCOUNTER — Encounter: Payer: Self-pay | Admitting: Nurse Practitioner

## 2023-05-29 NOTE — Assessment & Plan Note (Signed)
History of asthma with daily smoking history. At this time I strongly suspect she has developed COPD given her symptoms which are consistent with exacerbation as such. I feel that she would benefit from evaluation with pulmonology for confirmation of diagnosis.  Plan: - OK to continue using Breztri as this has been helpful and history of smoking and asthma symptoms suggests COPD.  - Nebulized albuterol and pulmicort sent to pharmacy.  - Utilize eucrissa, budesonide, and albuterol nebulizer daily for at least the next week to help reduce current exacerbation.  - Consider pulmonology evaluation - Low threshold for oral steroids if symptoms worsen.

## 2023-05-29 NOTE — Assessment & Plan Note (Signed)
>>  ASSESSMENT AND PLAN FOR ASTHMA WRITTEN ON 05/29/2023 11:24 PM BY Latrica Clowers E, NP  History of asthma with daily smoking history. At this time I strongly suspect she has developed COPD given her symptoms which are consistent with exacerbation as such. I feel that she would benefit from evaluation with pulmonology for confirmation of diagnosis.  Plan: - OK to continue using Breztri  as this has been helpful and history of smoking and asthma symptoms suggests COPD.  - Nebulized albuterol  and pulmicort  sent to pharmacy.  - Utilize eucrissa, budesonide , and albuterol  nebulizer daily for at least the next week to help reduce current exacerbation.  - Consider pulmonology evaluation - Low threshold for oral steroids if symptoms worsen.

## 2023-05-30 ENCOUNTER — Other Ambulatory Visit (INDEPENDENT_AMBULATORY_CARE_PROVIDER_SITE_OTHER): Payer: Medicare HMO

## 2023-05-30 ENCOUNTER — Other Ambulatory Visit: Payer: Self-pay | Admitting: Nurse Practitioner

## 2023-05-30 ENCOUNTER — Other Ambulatory Visit: Payer: Self-pay

## 2023-05-30 ENCOUNTER — Encounter: Payer: Self-pay | Admitting: Orthopedic Surgery

## 2023-05-30 ENCOUNTER — Ambulatory Visit (INDEPENDENT_AMBULATORY_CARE_PROVIDER_SITE_OTHER): Payer: Medicare HMO | Admitting: Orthopedic Surgery

## 2023-05-30 DIAGNOSIS — S43432A Superior glenoid labrum lesion of left shoulder, initial encounter: Secondary | ICD-10-CM | POA: Diagnosis not present

## 2023-05-30 DIAGNOSIS — M67814 Other specified disorders of tendon, left shoulder: Secondary | ICD-10-CM

## 2023-05-30 DIAGNOSIS — R6889 Other general symptoms and signs: Secondary | ICD-10-CM | POA: Diagnosis not present

## 2023-05-30 DIAGNOSIS — E1121 Type 2 diabetes mellitus with diabetic nephropathy: Secondary | ICD-10-CM

## 2023-05-30 DIAGNOSIS — M25512 Pain in left shoulder: Secondary | ICD-10-CM | POA: Diagnosis not present

## 2023-05-30 DIAGNOSIS — I10 Essential (primary) hypertension: Secondary | ICD-10-CM

## 2023-05-30 DIAGNOSIS — K2100 Gastro-esophageal reflux disease with esophagitis, without bleeding: Secondary | ICD-10-CM

## 2023-05-30 DIAGNOSIS — M79602 Pain in left arm: Secondary | ICD-10-CM

## 2023-05-30 DIAGNOSIS — M19012 Primary osteoarthritis, left shoulder: Secondary | ICD-10-CM

## 2023-05-30 DIAGNOSIS — E1165 Type 2 diabetes mellitus with hyperglycemia: Secondary | ICD-10-CM

## 2023-05-30 DIAGNOSIS — E782 Mixed hyperlipidemia: Secondary | ICD-10-CM

## 2023-05-30 MED ORDER — LIDOCAINE HCL 1 % IJ SOLN
3.0000 mL | INTRAMUSCULAR | Status: AC | PRN
Start: 2023-05-30 — End: 2023-05-30
  Administered 2023-05-30: 3 mL

## 2023-05-30 MED ORDER — METHYLPREDNISOLONE ACETATE 40 MG/ML IJ SUSP
13.3300 mg | INTRAMUSCULAR | Status: AC | PRN
Start: 2023-05-30 — End: 2023-05-30
  Administered 2023-05-30: 13.33 mg via INTRA_ARTICULAR

## 2023-05-30 MED ORDER — LIDOCAINE HCL 1 % IJ SOLN
5.0000 mL | INTRAMUSCULAR | Status: AC | PRN
Start: 2023-05-30 — End: 2023-05-30
  Administered 2023-05-30: 5 mL

## 2023-05-30 MED ORDER — BUPIVACAINE HCL 0.25 % IJ SOLN
0.6600 mL | INTRAMUSCULAR | Status: AC | PRN
Start: 2023-05-30 — End: 2023-05-30
  Administered 2023-05-30: .66 mL via INTRA_ARTICULAR

## 2023-05-30 MED ORDER — METHYLPREDNISOLONE ACETATE 40 MG/ML IJ SUSP
40.0000 mg | INTRAMUSCULAR | Status: AC | PRN
Start: 2023-05-30 — End: 2023-05-30
  Administered 2023-05-30: 40 mg via INTRA_ARTICULAR

## 2023-05-30 MED ORDER — BUPIVACAINE HCL 0.5 % IJ SOLN
9.0000 mL | INTRAMUSCULAR | Status: AC | PRN
Start: 2023-05-30 — End: 2023-05-30
  Administered 2023-05-30: 9 mL via INTRA_ARTICULAR

## 2023-05-30 NOTE — Progress Notes (Signed)
Office Visit Note   Patient: Lindsay Ellis           Date of Birth: February 25, 1976           MRN: [id number] Visit Date: 05/30/2023 Requested by: Jac Canavan, PA-C [address] [place],  [place] [address] PCP: Tollie Eth, NP  Subjective: Chief Complaint  Patient presents with   Other     Bilateral shoulder/arm pain with neck pain    HPI: Lindsay Ellis is a 47 y.o. female who presents to the office reporting bilateral shoulder pain left versus right for the past year.  She reports some radicular pain as well as neck pain.  Pain wakes her from sleep at night.  Pain is definitely worse with certain movements.  Localizes pain to the superior aspect of the shoulder as well as the lateral aspect of that left shoulder.  Prior injection last year when seeing rheumatology gave her some relief.  No prior shoulder surgery.  She has a history of reflux sympathetic dystrophy years ago from right-sided hand laceration.  She is a stay-at-home mother.  MRI scan of the left shoulder is reviewed.  Moderate arthropathy of the [facility] joint is present with marrow edema in the distal end of the clavicle as well as severe tendinosis of the intra-articular portion of the long head of the biceps tendon.  Rotator cuff looks intact.  Not too much arthritis in the glenohumeral joint..                ROS: All systems reviewed are negative as they relate to the chief complaint within the history of present illness.  Patient denies fevers or chills.  Assessment & Plan: Visit Diagnoses:  1. Left arm pain   2. Left shoulder pain, unspecified chronicity     Plan: Impression is bilateral shoulder pain left worse than right.  I think she does have both biceps tendon tendinitis and AC joint arthritis.  Diagnostic and therapeutic injections performed under ultrasound guidance into both joints today.  Plan to see her back in 4 weeks for clinical recheck.  Follow-Up Instructions: No follow-ups on file.   Orders:   Orders Placed This Encounter  Procedures   XR Cervical Spine 2 or 3 views   US Guided Needle Placement - No Linked Charges   No orders of the defined types were placed in this encounter.     Procedures: Large Joint Inj: L glenohumeral on 05/30/2023 6:34 PM Indications: diagnostic evaluation and pain Details: 18 G 1.5 in needle, posterior approach  Arthrogram: No  Medications: 9 mL bupivacaine 0.5 %; 40 mg methylPREDNISolone acetate 40 MG/ML; 5 mL lidocaine 1 % Outcome: tolerated well, no immediate complications Procedure, treatment alternatives, risks and benefits explained, specific risks discussed. Consent was given by the patient. Immediately prior to procedure a time out was called to verify the correct patient, procedure, equipment, support staff and site/side marked as required. Patient was prepped and draped in the usual sterile fashion.    Medium Joint Inj: L acromioclavicular on 05/30/2023 6:34 PM Indications: diagnostic evaluation and pain Details: 25 G 1.5 in needle, superior approach Medications: 3 mL lidocaine 1 %; 0.66 mL bupivacaine 0.25 %; 13.33 mg methylPREDNISolone acetate 40 MG/ML Outcome: tolerated well, no immediate complications Procedure, treatment alternatives, risks and benefits explained, specific risks discussed. Consent was given by the patient. Immediately prior to procedure a time out was called to verify the correct patient, procedure, equipment, support staff and site/side marked as required.  Patient was prepped and draped in the usual sterile fashion.       Clinical Data: No additional findings.  Objective: Vital Signs: LMP 11/02/2015   Physical Exam:  Constitutional: Patient appears well-developed HEENT:  Head: Normocephalic Eyes:EOM are normal Neck: Normal range of motion Cardiovascular: Normal rate Pulmonary/chest: Effort normal Neurologic: Patient is alert Skin: Skin is warm Psychiatric: Patient has normal mood and affect  Ortho  Exam: Ortho exam demonstrates tenderness on the left AC joint palpation compared to the right.  There are some pain with crossarm adduction as well.  Passive range of motion of both shoulders maintained at 60/95/160.  There is pain with crossarm adduction on the left compared to the right.  O'Brien's testing positive on the left negative on the right.  Does have tenderness to biceps tendon palpation on the left compared to the right.  Specialty Comments:  No specialty comments available.  Imaging: XR Cervical Spine 2 or 3 views  Result Date: 05/30/2023 AP lateral cervical spine radiographs reviewed.  There is mild degenerative disc disease at C6-7 and C7-T1.  Facet arthritis is moderately severe at C7-T1.  No compression fractures.  Some anterior spurring is present at C5 and C6 vertebral bodies.  No spondylolisthesis.  US Guided Needle Placement - No Linked Charges  Result Date: 05/30/2023 Ultrasound imaging demonstrates needle placement into the left [facility] joint with extravasation of fluid and no complicating features.  We also performed ultrasound-guided injection into the left glenohumeral joint with extravasation of fluid and no complicating features.    PMFS History: Patient Active Problem List   Diagnosis Date Noted   Asthma 08/05/2022   Lumbar radiculopathy 08/05/2022   Morbid obesity (HCC) 08/05/2022   Acquired trigger finger 05/26/2022   Diabetic renal disease (HCC) 05/18/2022   Essential hypertension 05/18/2022   Gastroesophageal reflux disease with esophagitis without hemorrhage 05/18/2022   Hyperglycemia due to type 2 diabetes mellitus (HCC) 05/18/2022   Mixed hyperlipidemia 05/18/2022   Complex regional pain syndrome I, unspecified 05/18/2022   Primary osteoarthritis 05/18/2022   Sickle cell trait (HCC) 05/18/2022   Poor compliance with CPAP treatment 10/02/2020   Tachycardia-bradycardia (HCC) 10/20/2018   Obstructive sleep apnea treated with continuous positive airway  pressure (CPAP) 06/06/2018   Sleep walking and eating 04/04/2018   Irregular menses 09/17/2014   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   Sickle cell trait (HCC)    Tuberculosis    positive test TB, treated in 2003   Vertigo     Family History  Problem Relation Age of Onset   Lung cancer Mother     Past Surgical History:  Procedure Laterality Date   CESAREAN SECTION     LUMBAR DISC SURGERY  2011   UMBILICAL HERNIA REPAIR N/A 07/20/2022   Procedure: OPEN UMBILICAL HERNIA REPAIR WITH MESH;  Surgeon: Stechschulte, Hyman Hopes, MD;  Location: [facility];  Service: General;  Laterality: N/A;   Social History   Occupational History   Not on file  Tobacco Use   Smoking status: Every Day    Current packs/day: 0.25    Types: Cigarettes   Smokeless tobacco: Never  Vaping Use   Vaping status: Never Used  Substance and Sexual Activity   Alcohol use: Yes    Comment: occasionally  Drug use: Never   Sexual activity: Not on file

## 2023-06-20 ENCOUNTER — Telehealth: Payer: Self-pay

## 2023-06-20 ENCOUNTER — Telehealth: Payer: Self-pay | Admitting: Nurse Practitioner

## 2023-06-20 DIAGNOSIS — M5416 Radiculopathy, lumbar region: Secondary | ICD-10-CM

## 2023-06-20 NOTE — Telephone Encounter (Signed)
Pt left message needs refill Cyclobenzaprine

## 2023-06-20 NOTE — Telephone Encounter (Signed)
Refill request faxed for ipratropium 0.02% Neb Sol (0.5mg /2.76ml) and budesonide 0.25mg /6mL [employer]

## 2023-06-21 MED ORDER — CYCLOBENZAPRINE HCL 10 MG PO TABS
10.0000 mg | ORAL_TABLET | Freq: Every day | ORAL | 1 refills | Status: DC | PRN
Start: 2023-06-21 — End: 2023-08-31

## 2023-06-30 ENCOUNTER — Ambulatory Visit (INDEPENDENT_AMBULATORY_CARE_PROVIDER_SITE_OTHER): Payer: Medicare HMO | Admitting: Orthopedic Surgery

## 2023-06-30 DIAGNOSIS — M25512 Pain in left shoulder: Secondary | ICD-10-CM

## 2023-06-30 DIAGNOSIS — R6889 Other general symptoms and signs: Secondary | ICD-10-CM | POA: Diagnosis not present

## 2023-07-02 ENCOUNTER — Encounter: Payer: Self-pay | Admitting: Orthopedic Surgery

## 2023-07-02 NOTE — Progress Notes (Signed)
Office Visit Note   Patient: Lindsay Ellis           Date of Birth: 1976-06-01           MRN: [id number] Visit Date: 06/30/2023 Requested by: Tollie Eth, NP [address] [place],  [place] [address] PCP: Tollie Eth, NP  Subjective: Chief Complaint  Patient presents with   Left Shoulder - Pain   Other    Right forearm/wrist/elbow pain    HPI: Lindsay Ellis is a 47 y.o. female who presents to the office reporting left shoulder pain for several months duration.  Denies any history of injury.  Pain in the left shoulder wakes her from sleep at night.  Denies any numbness and tingling.  Describes no radiating pain or neck pain.  Generally global shoulder pain.  She did have left glenohumeral joint and AC joint injection on 05/30/2023 which gave her 3 days of relief.  Most of her pain remains in the superior region.  Patient also reports right elbow and forearm and wrist pain.  Denies any history of injury.  Does report radiating symptoms.  Pain increases with grip and squeezing.  No numbness and tingling.  Reports decreased strength.  Does have a history of RSD from prior right index finger nerve injury..                ROS: All systems reviewed are negative as they relate to the chief complaint within the history of present illness.  Patient denies fevers or chills.  Assessment & Plan: Visit Diagnoses:  1. Left shoulder pain, unspecified chronicity     Plan: Impression is continued left shoulder pain despite conservative measures.  Pain ongoing from several months.  3 days relief from prior injection.  Needs MRI scan to evaluate AC joint and biceps tendon as possible pain generators.  Does not look frozen and rotator cuff strength looks good.  Regarding the right elbow this looks like classic tendinosis of the common extensor origin.  Counterforce brace recommended.  Plan to see her back after the MRI scan.  Follow-Up Instructions: No follow-ups on file.   Orders:  Orders  Placed This Encounter  Procedures   MR Shoulder Left w/ contrast   Arthrogram   No orders of the defined types were placed in this encounter.     Procedures: No procedures performed   Clinical Data: No additional findings.  Objective: Vital Signs: LMP 11/02/2015   Physical Exam:  Constitutional: Patient appears well-developed HEENT:  Head: Normocephalic Eyes:EOM are normal Neck: Normal range of motion Cardiovascular: Normal rate Pulmonary/chest: Effort normal Neurologic: Patient is alert Skin: Skin is warm Psychiatric: Patient has normal mood and affect  Ortho Exam: Ortho exam demonstrates range of motion on the left of 70/100/160.  Rotator cuff strength intact infraspinatus supraspinatus and subscap muscle testing with no Popeye deformity and slight biceps tendon tenderness in the groove.  Does report AC joint pain as well as some pain with crossarm adduction.  No coarse grinding or crepitus in the joint with internal/external rotation although O'Brien's testing is equivocal on the left negative on the right.  Patient also has tenderness to palpation of the common extensor origin attachment site.  Elbow range of motion is full flexion extension pronation supination.  Pain with resisted finger extension and wrist extension.  This localizes to the lateral epicondyle.  Specialty Comments:  No specialty comments available.  Imaging: No results found.   PMFS History: Patient Active Problem List  Diagnosis Date Noted   Asthma 08/05/2022   Lumbar radiculopathy 08/05/2022   Morbid obesity (HCC) 08/05/2022   Acquired trigger finger 05/26/2022   Diabetic renal disease (HCC) 05/18/2022   Essential hypertension 05/18/2022   Gastroesophageal reflux disease with esophagitis without hemorrhage 05/18/2022   Hyperglycemia due to type 2 diabetes mellitus (HCC) 05/18/2022   Mixed hyperlipidemia 05/18/2022   Complex regional pain syndrome I, unspecified 05/18/2022   Primary  osteoarthritis 05/18/2022   Sickle cell trait (HCC) 05/18/2022   Poor compliance with CPAP treatment 10/02/2020   Tachycardia-bradycardia (HCC) 10/20/2018   Obstructive sleep apnea treated with continuous positive airway pressure (CPAP) 06/06/2018   Sleep walking and eating 04/04/2018   Irregular menses 09/17/2014   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   Sickle cell trait (HCC)    Tuberculosis    positive test TB, treated in 2003   Vertigo     Family History  Problem Relation Age of Onset   Lung cancer Mother     Past Surgical History:  Procedure Laterality Date   CESAREAN SECTION     LUMBAR DISC SURGERY  2011   UMBILICAL HERNIA REPAIR N/A 07/20/2022   Procedure: OPEN UMBILICAL HERNIA REPAIR WITH MESH;  Surgeon: Stechschulte, Hyman Hopes, MD;  Location: [facility];  Service: General;  Laterality: N/A;   Social History   Occupational History   Not on file  Tobacco Use   Smoking status: Every Day    Current packs/day: 0.25    Types: Cigarettes   Smokeless tobacco: Never  Vaping Use   Vaping status: Never Used  Substance and Sexual Activity   Alcohol use: Yes    Comment: occasionally   Drug use: Never   Sexual activity: Not on file

## 2023-07-06 ENCOUNTER — Other Ambulatory Visit: Payer: Medicare HMO

## 2023-07-06 ENCOUNTER — Telehealth: Payer: Self-pay

## 2023-07-06 DIAGNOSIS — E559 Vitamin D deficiency, unspecified: Secondary | ICD-10-CM | POA: Diagnosis not present

## 2023-07-06 DIAGNOSIS — R6889 Other general symptoms and signs: Secondary | ICD-10-CM | POA: Diagnosis not present

## 2023-07-06 NOTE — Telephone Encounter (Signed)
Rx request for cyclobenzaprine 10 MG tab. Last med check 04/07/23

## 2023-07-06 NOTE — Telephone Encounter (Signed)
Refill was sent to pharmacy on file on 06/21/2023 with 1 refill.

## 2023-07-07 LAB — VITAMIN D 25 HYDROXY (VIT D DEFICIENCY, FRACTURES): Vit D, 25-Hydroxy: 26.9 ng/mL — ABNORMAL LOW (ref 30.0–100.0)

## 2023-07-08 ENCOUNTER — Telehealth: Payer: Self-pay

## 2023-07-08 NOTE — Telephone Encounter (Signed)
Fax request states patient request cyclobenzaprine to be sent to [facility]. Originally sent to [facility] on [address] on 06/21/23.

## 2023-07-13 ENCOUNTER — Other Ambulatory Visit: Payer: Self-pay | Admitting: Nurse Practitioner

## 2023-07-13 DIAGNOSIS — E559 Vitamin D deficiency, unspecified: Secondary | ICD-10-CM

## 2023-07-13 MED ORDER — VITAMIN D (ERGOCALCIFEROL) 1.25 MG (50000 UNIT) PO CAPS
50000.0000 [IU] | ORAL_CAPSULE | ORAL | 3 refills | Status: DC
Start: 2023-07-13 — End: 2023-12-30

## 2023-08-10 ENCOUNTER — Telehealth: Payer: Self-pay | Admitting: Nurse Practitioner

## 2023-08-10 ENCOUNTER — Other Ambulatory Visit: Payer: Self-pay

## 2023-08-10 DIAGNOSIS — E1165 Type 2 diabetes mellitus with hyperglycemia: Secondary | ICD-10-CM

## 2023-08-10 DIAGNOSIS — E1121 Type 2 diabetes mellitus with diabetic nephropathy: Secondary | ICD-10-CM

## 2023-08-10 DIAGNOSIS — I10 Essential (primary) hypertension: Secondary | ICD-10-CM

## 2023-08-10 DIAGNOSIS — E782 Mixed hyperlipidemia: Secondary | ICD-10-CM

## 2023-08-10 DIAGNOSIS — K2100 Gastro-esophageal reflux disease with esophagitis, without bleeding: Secondary | ICD-10-CM

## 2023-08-10 MED ORDER — MOUNJARO 5 MG/0.5ML ~~LOC~~ SOAJ: 5.0000 mg | SUBCUTANEOUS | 1 refills | Status: DC

## 2023-08-10 NOTE — Telephone Encounter (Signed)
Fax from [facility]  Mounjaro 5mg /0.5 ml Qty 4

## 2023-08-17 ENCOUNTER — Ambulatory Visit
Admission: RE | Admit: 2023-08-17 | Discharge: 2023-08-17 | Disposition: A | Discharge status: Home / Self Care | Payer: Medicare HMO | Source: Ambulatory Visit | Attending: Orthopedic Surgery | Admitting: Orthopedic Surgery

## 2023-08-17 DIAGNOSIS — M25512 Pain in left shoulder: Secondary | ICD-10-CM

## 2023-08-17 DIAGNOSIS — M75112 Incomplete rotator cuff tear or rupture of left shoulder, not specified as traumatic: Secondary | ICD-10-CM | POA: Diagnosis not present

## 2023-08-17 DIAGNOSIS — M19012 Primary osteoarthritis, left shoulder: Secondary | ICD-10-CM | POA: Diagnosis not present

## 2023-08-17 DIAGNOSIS — R6889 Other general symptoms and signs: Secondary | ICD-10-CM | POA: Diagnosis not present

## 2023-08-17 MED ORDER — IOPAMIDOL (ISOVUE-M 200) INJECTION 41%
10.0000 mL | Freq: Once | INTRAMUSCULAR | Status: AC
  Administered 2023-08-17: 10 mL via INTRA_ARTICULAR

## 2023-08-19 NOTE — Progress Notes (Signed)
Can u have her come in for f/u thx

## 2023-08-22 ENCOUNTER — Telehealth: Payer: Self-pay

## 2023-08-22 NOTE — Telephone Encounter (Signed)
Please call patient to schedule scan review

## 2023-08-22 NOTE — Telephone Encounter (Signed)
-----   Message from Burnard Bunting sent at 08/19/2023  9:20 PM EDT ----- Can u have her come in for f/u thx

## 2023-08-29 ENCOUNTER — Encounter: Payer: Self-pay | Admitting: Orthopedic Surgery

## 2023-08-29 ENCOUNTER — Ambulatory Visit: Payer: Medicare HMO | Admitting: Orthopedic Surgery

## 2023-08-29 DIAGNOSIS — R6889 Other general symptoms and signs: Secondary | ICD-10-CM | POA: Diagnosis not present

## 2023-08-29 DIAGNOSIS — M75122 Complete rotator cuff tear or rupture of left shoulder, not specified as traumatic: Secondary | ICD-10-CM | POA: Diagnosis not present

## 2023-08-29 DIAGNOSIS — M67814 Other specified disorders of tendon, left shoulder: Secondary | ICD-10-CM | POA: Diagnosis not present

## 2023-08-29 DIAGNOSIS — M19012 Primary osteoarthritis, left shoulder: Secondary | ICD-10-CM

## 2023-08-31 ENCOUNTER — Other Ambulatory Visit: Payer: Self-pay

## 2023-08-31 ENCOUNTER — Telehealth: Payer: Self-pay | Admitting: Family Medicine

## 2023-08-31 DIAGNOSIS — J4551 Severe persistent asthma with (acute) exacerbation: Secondary | ICD-10-CM

## 2023-08-31 DIAGNOSIS — M5416 Radiculopathy, lumbar region: Secondary | ICD-10-CM

## 2023-08-31 MED ORDER — TRUE METRIX BLOOD GLUCOSE TEST VI STRP: ORAL_STRIP | 1 refills | Status: DC

## 2023-08-31 MED ORDER — REVEFENACIN 175 MCG/3ML IN SOLN: 175.0000 ug | Freq: Every day | RESPIRATORY_TRACT | 1 refills | Status: DC

## 2023-08-31 MED ORDER — CYCLOBENZAPRINE HCL 10 MG PO TABS: 10.0000 mg | ORAL_TABLET | Freq: Every day | ORAL | 0 refills | Status: DC | PRN

## 2023-08-31 NOTE — Telephone Encounter (Signed)
[facility] is req refills for Cyclobenzaprine 10 mg, Yupelri 175 mcg, true metrix test strips

## 2023-08-31 NOTE — Telephone Encounter (Signed)
Called pt reach vm, needs cpe, lmtrc.

## 2023-09-02 ENCOUNTER — Encounter: Payer: Self-pay | Admitting: Nurse Practitioner

## 2023-09-02 ENCOUNTER — Telehealth: Payer: Self-pay

## 2023-09-02 ENCOUNTER — Other Ambulatory Visit: Payer: Self-pay

## 2023-09-02 ENCOUNTER — Ambulatory Visit: Payer: Medicare HMO | Admitting: Nurse Practitioner

## 2023-09-02 VITALS — BP 130/84 | HR 88 | Ht 64.5 in | Wt 183.8 lb

## 2023-09-02 DIAGNOSIS — K2100 Gastro-esophageal reflux disease with esophagitis, without bleeding: Secondary | ICD-10-CM | POA: Diagnosis not present

## 2023-09-02 DIAGNOSIS — Z23 Encounter for immunization: Secondary | ICD-10-CM | POA: Diagnosis not present

## 2023-09-02 DIAGNOSIS — J4541 Moderate persistent asthma with (acute) exacerbation: Secondary | ICD-10-CM

## 2023-09-02 DIAGNOSIS — E1169 Type 2 diabetes mellitus with other specified complication: Secondary | ICD-10-CM

## 2023-09-02 DIAGNOSIS — E782 Mixed hyperlipidemia: Secondary | ICD-10-CM

## 2023-09-02 DIAGNOSIS — S43432A Superior glenoid labrum lesion of left shoulder, initial encounter: Secondary | ICD-10-CM

## 2023-09-02 DIAGNOSIS — Z0000 Encounter for general adult medical examination without abnormal findings: Secondary | ICD-10-CM

## 2023-09-02 DIAGNOSIS — E559 Vitamin D deficiency, unspecified: Secondary | ICD-10-CM

## 2023-09-02 DIAGNOSIS — E1121 Type 2 diabetes mellitus with diabetic nephropathy: Secondary | ICD-10-CM

## 2023-09-02 DIAGNOSIS — B9689 Other specified bacterial agents as the cause of diseases classified elsewhere: Secondary | ICD-10-CM

## 2023-09-02 DIAGNOSIS — M75102 Unspecified rotator cuff tear or rupture of left shoulder, not specified as traumatic: Secondary | ICD-10-CM

## 2023-09-02 DIAGNOSIS — I495 Sick sinus syndrome: Secondary | ICD-10-CM

## 2023-09-02 DIAGNOSIS — E1165 Type 2 diabetes mellitus with hyperglycemia: Secondary | ICD-10-CM

## 2023-09-02 DIAGNOSIS — I10 Essential (primary) hypertension: Secondary | ICD-10-CM | POA: Diagnosis not present

## 2023-09-02 DIAGNOSIS — D573 Sickle-cell trait: Secondary | ICD-10-CM

## 2023-09-02 DIAGNOSIS — L301 Dyshidrosis [pompholyx]: Secondary | ICD-10-CM

## 2023-09-02 MED ORDER — BLOOD GLUCOSE TEST VI STRP: ORAL_STRIP | 99 refills | Status: DC

## 2023-09-02 MED ORDER — BLOOD GLUCOSE MONITORING SUPPL DEVI: 1.0000 | Freq: Three times a day (TID) | 0 refills | Status: AC

## 2023-09-02 MED ORDER — LANCET DEVICE MISC: 99 refills | Status: AC

## 2023-09-02 MED ORDER — MOMETASONE FUROATE 0.1 % EX CREA: TOPICAL_CREAM | CUTANEOUS | 1 refills | Status: AC

## 2023-09-02 MED ORDER — BREZTRI AEROSPHERE 160-9-4.8 MCG/ACT IN AERO: 2.0000 | INHALATION_SPRAY | Freq: Two times a day (BID) | RESPIRATORY_TRACT | 11 refills | Status: DC

## 2023-09-02 MED ORDER — ALBUTEROL SULFATE HFA 108 (90 BASE) MCG/ACT IN AERS: 2.0000 | INHALATION_SPRAY | Freq: Four times a day (QID) | RESPIRATORY_TRACT | 6 refills | Status: DC | PRN

## 2023-09-02 MED ORDER — LANCETS MISC. MISC: 99 refills | Status: AC

## 2023-09-02 NOTE — Assessment & Plan Note (Signed)
>>  ASSESSMENT AND PLAN FOR TEAR OF LEFT ROTATOR CUFF WRITTEN ON 09/02/2023  9:11 AM BY Juwana Thoreson E, NP  Upcoming surgery for a shoulder tear involving the rotator cuff. -Labs monitored today. -No concerns present at this time that would limit the ability to have this surgery from primary care standpoint.

## 2023-09-02 NOTE — Progress Notes (Signed)
Lindsay Clamp, DNP, AGNP-c [facility] [address] [place], [place] [address] Main Office [phone]  BP 130/84   Pulse 88   Ht 5' 4.5" (1.638 m)   Wt 183 lb 12.8 oz (83.4 kg)   LMP 11/02/2015   BMI 31.06 kg/m    Subjective:    Patient ID: Lindsay Ellis, female    DOB: 10-Nov-1975, 47 y.o.   MRN: [id number]  HPI: Lindsay Ellis is a 47 y.o. female presenting on 09/02/2023 for comprehensive medical examination.   Marland KitchenHistory of Present Illness [name] has a history of diabetes, hypertension, and chronic shoulder pain. She is scheduled for shoulder surgery due to a diagnosed tear and issues with the rotator cuff on the left. She reports no recent changes in her health status aside from the shoulder. She received both flu and COVID vaccines today. Her blood sugars have been stable, averaging around 117. She has had a mammogram and pap this year with GYN and these were normal.  The patient has been experiencing dry mouth, which she attributes to her medication. She has been managing this by chewing ice and drinking water, but reports that the dryness can be severe, to the point where her mouth feels like it's sticking to the back of her throat. She has also tried biotene, without significant help. She currently takes meclizine for chronic vertigo. She is unable to stop this medication due to the return of symptoms.  She has been using Mounjaro 5mg  for her diabetes and reports no issues. She has not noticed any sensitivity decrease, numbness, tingling, or sores on her feet. She reports constant back pain but denies any shortness of breath, chest pain, or heart palpitations.  The patient also reports a skin issue on one of her fingers with a pruritic rash on the distal ends of the thumb and first two fingers on the right hand, which she describes as not healing properly and sometimes causing pain. She has been unable to use this finger for certain tasks due to the condition. 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  Pertinent items are noted in HPI.  IMMUNIZATIONS:   Flu Vaccine: Flu vaccine given today Prevnar 13: Prevnar 13 N/A for this patient Prevnar 20: Prevnar 20 N/A for this patient Pneumovax 23: Pneumovax 23 N/A for this patient Vac Shingrix: Shingrix N/A for this patient HPV: N/A or Aged Out Tetanus: Tetanus completed in the last 10 years COVID: Due, completed today RSV: No  HEALTH MAINTENANCE: Pap Smear HM Status: is up to date- [facility] Mammogram HM Status: is up to date- [facility] Colon Cancer Screening HM Status: is up to date Bone Density HM Status: N/A STI Testing HM Status: was declined  Lung CT HM Status: N/A  Concerns with vision, hearing, or dentition: No  Most Recent Depression Screen:     09/02/2023    8:15 AM 03/15/2023   10:18 AM 01/04/2023    2:48 PM 04/25/2018    2:18 PM 03/01/2018   11:55 PM  Depression screen PHQ 2/9  Decreased Interest 0 0 0 0 0  Down, Depressed, Hopeless 0 0 0 0 0  PHQ - 2 Score 0 0 0 0 0  Altered sleeping  0     Tired, decreased energy  1     Change in appetite  0     Feeling bad or failure about yourself   0     Trouble concentrating  0  Moving slowly or fidgety/restless  0     Suicidal thoughts  0     PHQ-9 Score  1     Difficult doing work/chores  Not difficult at all      Most Recent Anxiety Screen:      No data to display         Most Recent Fall Screen:    09/02/2023    8:15 AM 03/15/2023   10:18 AM 01/04/2023    2:48 PM 04/25/2018    2:18 PM 03/01/2018   11:55 PM  Fall Risk   Falls in the past year? 0 0 0 No No  Number falls in past yr: 0 0 0    Injury with Fall? 0 0 0    Risk for fall due to : No Fall Risks Medication side effect No Fall Risks    Follow up Falls evaluation completed Falls prevention discussed;Education provided;Falls evaluation completed Falls evaluation completed      Past medical history, surgical history, medications, allergies, family history  and social history reviewed with patient today and changes made to appropriate areas of the chart.  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   atorvastatin (LIPITOR) 20 MG tablet Take 1 tablet (20 mg total) by mouth daily.   budesonide (PULMICORT) 0.25 MG/2ML nebulizer solution Take 2 mLs (0.25 mg total) by nebulization daily. Use with Yupelri and Ipratropium in place of daily inhaler for flairs.   cyclobenzaprine (FLEXERIL) 10 MG tablet Take 1 tablet (10 mg total) by mouth daily as needed for muscle spasms.   Ferrous Sulfate (IRON PO) Take 1 tablet by mouth daily.   gabapentin (NEURONTIN) 300 MG capsule Take 5 capsules (1,500 mg total) by mouth 3 (three) times daily.   hydrochlorothiazide (HYDRODIURIL) 12.5 MG tablet Take 1 tablet (12.5 mg total) by mouth daily.   ipratropium (ATROVENT) 0.02 % nebulizer solution Take 2.5 mLs (0.5 mg total) by nebulization every 6 (six) hours as needed for wheezing or shortness of breath. Use once daily with budesonide and Yupelri in place of Daily inhaler during flairs.   JARDIANCE 25 MG TABS tablet Take 1 tablet (25 mg total) by mouth daily.   meclizine (ANTIVERT) 25 MG tablet Take 1 tablet (25 mg total) by mouth 3 (three) times daily.   meloxicam (MOBIC) 15 MG tablet Take 1 tablet (15 mg total) by mouth daily.   metFORMIN (GLUCOPHAGE-XR) 500 MG 24 hr tablet Take 2 tablets (1,000 mg total) by mouth 2 (two) times daily.   montelukast (SINGULAIR) 10 MG tablet Take 1 tablet  (10 mg total) by mouth at bedtime.   Multiple Vitamin (MULTIVITAMIN) tablet Take 1 tablet by mouth daily.   RABEprazole (ACIPHEX) 20 MG tablet Take 1 tablet (20 mg total) by mouth daily.   revefenacin (YUPELRI) 175 MCG/3ML nebulizer solution Take 3 mLs (175 mcg total) by nebulization daily. Use with ipratropium and budesonide.   SYMBICORT 160-4.5 MCG/ACT inhaler Inhale  2 puffs into the lungs 2 (two) times daily as needed (shortness of breath).   telmisartan (MICARDIS) 40 MG tablet Take 1 tablet (40 mg total) by mouth daily.   tirzepatide [facility]) 5 MG/0.5ML Pen Inject 5 mg into the skin once a week.   Vitamin D, Ergocalciferol, (DRISDOL) 1.25 MG (50000 UNIT) CAPS capsule Take 1 capsule (50,000 Units total) by mouth every 7 (seven) days.   No current facility-administered medications on file prior to visit.   Surgical History:  Past Surgical History:  Procedure Laterality Date   CESAREAN SECTION     LUMBAR DISC SURGERY  2011   UMBILICAL HERNIA REPAIR N/A 07/20/2022   Procedure: OPEN UMBILICAL HERNIA REPAIR WITH MESH;  Surgeon: Stechschulte, Hyman Hopes, MD;  Location: [facility];  Service: General;  Laterality: N/A;   Allergies:  Allergies  Allergen Reactions   Morphine And Codeine Hives and Itching   Family History:  Family History  Problem Relation Age of Onset   Lung cancer Mother        Objective:    BP 130/84   Pulse 88   Ht 5' 4.5" (1.638 m)   Wt 183 lb 12.8 oz (83.4 kg)   LMP 11/02/2015   BMI 31.06 kg/m   Wt Readings from Last 3 Encounters:  09/02/23 183 lb 12.8 oz (83.4 kg)  05/18/23 181 lb 9.6 oz (82.4 kg)  04/07/23 183 lb 9.6 oz (83.3 kg)    Physical Exam Vitals and nursing note reviewed.  Constitutional:      Appearance: Normal appearance.  HENT:     Head: Normocephalic.     Right Ear: Tympanic membrane, ear canal and external ear normal.     Left Ear: Tympanic membrane, ear canal and external ear normal.     Nose: Nose normal.     Mouth/Throat:     Mouth:  Mucous membranes are moist.     Pharynx: Oropharynx is clear.  Eyes:     Conjunctiva/sclera: Conjunctivae normal.     Pupils: Pupils are equal, round, and reactive to light.  Neck:     Vascular: No carotid bruit.     Comments: Left side  Cardiovascular:     Rate and Rhythm: Normal rate and regular rhythm.     Pulses: Normal pulses.     Heart sounds: Normal heart sounds.  Pulmonary:     Effort: Pulmonary effort is normal.     Breath sounds: Rales present.  Abdominal:     General: Bowel sounds are normal. There is no distension.     Palpations: Abdomen is soft.     Tenderness: There is no abdominal tenderness. There is no right CVA tenderness, left CVA tenderness, guarding or rebound.  Musculoskeletal:        General: Normal range of motion.     Cervical back: Neck supple. No tenderness.     Right lower leg: No edema.     Left lower leg: No edema.  Lymphadenopathy:     Cervical: Cervical adenopathy present.  Skin:    General: Skin is warm and dry.     Capillary Refill: Capillary refill takes less than 2 seconds.     Findings: Rash present.     Comments: Dishidrotic eczema on right hand first three fingers.   Neurological:     General: No focal deficit present.     Mental Status: She is alert and oriented to person, place, and time.     Sensory: No sensory deficit.  Coordination: Coordination normal.     Gait: Gait normal.     Comments: Weakness in the left shoulder due to rotator cuff tear.  Psychiatric:        Behavior: Behavior normal.        Thought Content: Thought content normal.        Judgment: Judgment normal.      Results for orders placed or performed in visit on 09/02/23  CBC with Differential/Platelet  Result Value Ref Range   WBC 9.3 3.4 - 10.8 x10E3/uL   RBC 5.18 3.77 - 5.28 x10E6/uL   Hemoglobin 14.7 11.1 - 15.9 g/dL   Hematocrit 36.6 44.0 - 46.6 %   MCV 86 79 - 97 fL   MCH 28.4 26.6 - 33.0 pg   MCHC 32.9 31.5 - 35.7 g/dL   RDW 34.7 42.5 - 95.6  %   Platelets 371 150 - 450 x10E3/uL   Neutrophils 65 Not Estab. %   Lymphs 25 Not Estab. %   Monocytes 6 Not Estab. %   Eos 3 Not Estab. %   Basos 1 Not Estab. %   Neutrophils Absolute 6.1 1.4 - 7.0 x10E3/uL   Lymphocytes Absolute 2.3 0.7 - 3.1 x10E3/uL   Monocytes Absolute 0.5 0.1 - 0.9 x10E3/uL   EOS (ABSOLUTE) 0.3 0.0 - 0.4 x10E3/uL   Basophils Absolute 0.1 0.0 - 0.2 x10E3/uL   Immature Granulocytes 0 Not Estab. %   Immature Grans (Abs) 0.0 0.0 - 0.1 x10E3/uL  CMP14+EGFR  Result Value Ref Range   Glucose 100 (H) 70 - 99 mg/dL   BUN 13 6 - 24 mg/dL   Creatinine, Ser 3.87 0.57 - 1.00 mg/dL   eGFR 79 >56 EP/PIR/5.18   BUN/Creatinine Ratio 14 9 - 23   Sodium 145 (H) 134 - 144 mmol/L   Potassium 4.1 3.5 - 5.2 mmol/L   Chloride 107 (H) 96 - 106 mmol/L   CO2 27 20 - 29 mmol/L   Calcium 9.3 8.7 - 10.2 mg/dL   Total Protein 5.5 (L) 6.0 - 8.5 g/dL   Albumin 3.7 (L) 3.9 - 4.9 g/dL   Globulin, Total 1.8 1.5 - 4.5 g/dL   Bilirubin Total <8.4 0.0 - 1.2 mg/dL   Alkaline Phosphatase 83 44 - 121 IU/L   AST 21 0 - 40 IU/L   ALT 24 0 - 32 IU/L  Hemoglobin A1c  Result Value Ref Range   Hgb A1c MFr Bld 6.1 (H) 4.8 - 5.6 %   Est. average glucose Bld gHb Est-mCnc 128 mg/dL  Lipid panel  Result Value Ref Range   Cholesterol, Total 163 100 - 199 mg/dL   Triglycerides 93 0 - 149 mg/dL   HDL 45 >16 mg/dL   VLDL Cholesterol Cal 17 5 - 40 mg/dL   LDL Chol Calc (NIH) 606 (H) 0 - 99 mg/dL   Chol/HDL Ratio 3.6 0.0 - 4.4 ratio  Microalbumin / creatinine urine ratio  Result Value Ref Range   Creatinine, Urine 78.7 Not Estab. mg/dL   Microalbumin, Urine 6.5 Not Estab. ug/mL   Microalb/Creat Ratio 8 0 - 29 mg/g creat  Hepatitis C antibody  Result Value Ref Range   Hep C Virus Ab Non Reactive Non Reactive  HIV Antibody (routine testing w rflx)  Result Value Ref Range   HIV Screen 4th Generation wRfx Non Reactive Non Reactive  VITAMIN D 25 Hydroxy (Vit-D Deficiency, Fractures)  Result Value  Ref Range   Vit D, 25-Hydroxy 46.7 30.0 - 100.0 ng/mL  Assessment & Plan:   Problem List Items Addressed This Visit     Tachycardia-bradycardia (HCC)    No symptoms at this time. Will monitor.       Diabetic renal disease (HCC)    Chronic. Currently on Jardiance. No alarm symptoms present. Labs pending for monitoring.  -Ensure that you are keeping your blood sugars in tight range and staying well hydrated.  -Consider checking your blood pressure once a week to make sure it is staying lower than 130/80 on average.       Relevant Medications   Budeson-Glycopyrrol-Formoterol (BREZTRI AEROSPHERE) 160-9-4.8 MCG/ACT AERO   Blood Glucose Monitoring Suppl DEVI   Glucose Blood (BLOOD GLUCOSE TEST STRIPS) STRP   Lancet Device MISC   Lancets Misc. MISC   mometasone (ELOCON) 0.1 % cream   Other Relevant Orders   CBC with Differential/Platelet (Completed)   CMP14+EGFR (Completed)   Hemoglobin A1c (Completed)   Lipid panel (Completed)   Microalbumin / creatinine urine ratio (Completed)   HM Diabetes Foot Exam (Completed)   Hepatitis C antibody (Completed)   HIV Antibody (routine testing w rflx) (Completed)   VITAMIN D 25 Hydroxy (Vit-D Deficiency, Fractures) (Completed)   Essential hypertension    Chronic. BP stable. No alarm symptoms present at this time.  Medication: taking as prescribed, HCTZ. Goal blood pressure less than less than 130/80. Currently is not followed with cardiology. Comorbid conditions include DM, HLD Plan: current treatment plan is effective, no change in therapy, orders and follow up as documented in EpicCare.  Refills have been provided today. Labs today to check electrolytes and kidney function.  Will make changes to plan of care as necessary based on lab results.  Plan to follow-up in 6months.       Relevant Medications   Budeson-Glycopyrrol-Formoterol (BREZTRI AEROSPHERE) 160-9-4.8 MCG/ACT AERO   Blood Glucose Monitoring Suppl DEVI   Glucose Blood  (BLOOD GLUCOSE TEST STRIPS) STRP   Lancet Device MISC   Lancets Misc. MISC   mometasone (ELOCON) 0.1 % cream   Other Relevant Orders   CBC with Differential/Platelet (Completed)   CMP14+EGFR (Completed)   Hemoglobin A1c (Completed)   Lipid panel (Completed)   Microalbumin / creatinine urine ratio (Completed)   HM Diabetes Foot Exam (Completed)   Hepatitis C antibody (Completed)   HIV Antibody (routine testing w rflx) (Completed)   VITAMIN D 25 Hydroxy (Vit-D Deficiency, Fractures) (Completed)   Gastroesophageal reflux disease with esophagitis without hemorrhage    Not currently on medications for management. Labs pending.       Hyperlipidemia associated with type 2 diabetes mellitus (HCC)    Currently managed with statin therapy. No symptoms at this time. Will monitor      Relevant Medications   Budeson-Glycopyrrol-Formoterol (BREZTRI AEROSPHERE) 160-9-4.8 MCG/ACT AERO   Blood Glucose Monitoring Suppl DEVI   Glucose Blood (BLOOD GLUCOSE TEST STRIPS) STRP   Lancet Device MISC   Lancets Misc. MISC   mometasone (ELOCON) 0.1 % cream   Morbid obesity (HCC)    Chronic. Currently working on diet and exercise along with mounjaro for diabetes. Recommend strict monitoring of intake and output to reduce calorie burden. Exercise of at least 20 minutes every day to get sustained elevated heart rate and break a sweat. Labs pending.       Relevant Medications   Budeson-Glycopyrrol-Formoterol (BREZTRI AEROSPHERE) 160-9-4.8 MCG/ACT AERO   Sickle cell trait (HCC)    Stable. No reported symptoms.       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 -Prescribe a cream for dyshidrotic eczema to be applied at bedtime.      Relevant Medications   mometasone (ELOCON) 0.1 % cream   Vitamin D deficiency    Repeat labs      Relevant Orders   VITAMIN D 25 Hydroxy (Vit-D Deficiency, Fractures) (Completed)   Asthma    Breathing currently stable  with no concerns presented. Continue to monitor.       Relevant Medications   Budeson-Glycopyrrol-Formoterol (BREZTRI AEROSPHERE) 160-9-4.8 MCG/ACT AERO   Hyperglycemia due to type 2 diabetes mellitus (HCC)    Blood sugars well controlled at home with readings around 117. Request for a new glucose monitor kit. -Order a new glucose monitor kit based on insurance. Sent to [facility] -Check A1c to assess control -Plan to increase Mounjaro to 7.5mg       Relevant Medications   Budeson-Glycopyrrol-Formoterol (BREZTRI AEROSPHERE) 160-9-4.8 MCG/ACT AERO   Blood Glucose Monitoring Suppl DEVI   Glucose Blood (BLOOD GLUCOSE TEST STRIPS) STRP   Lancet Device MISC   Lancets Misc. MISC   Other Relevant Orders   CBC with Differential/Platelet (Completed)   CMP14+EGFR (Completed)   Hemoglobin A1c (Completed)   Lipid panel (Completed)   Microalbumin / creatinine urine ratio (Completed)   HM Diabetes Foot Exam (Completed)   Hepatitis C antibody (Completed)   HIV Antibody (routine testing w rflx) (Completed)   VITAMIN D 25 Hydroxy (Vit-D Deficiency, Fractures) (Completed)   Encounter for annual physical exam - Primary    CPE completed today. Review of HM activities and recommendations discussed and provided on AVS. Anticipatory guidance, diet, and exercise recommendations provided. Medications, allergies, and hx reviewed and updated as necessary. Orders placed as listed below.  Plan: - Labs ordered. Will make changes as necessary based on results.  - I will review these results and send recommendations via MyChart or a telephone call.  - F/U with CPE in 1 year or sooner for acute/chronic health needs as directed.        Relevant Medications   Budeson-Glycopyrrol-Formoterol (BREZTRI AEROSPHERE) 160-9-4.8 MCG/ACT AERO   Blood Glucose Monitoring Suppl DEVI   Glucose Blood (BLOOD GLUCOSE TEST STRIPS) STRP   Lancet Device MISC   Lancets Misc. MISC   mometasone (ELOCON) 0.1 % cream   Other  Relevant Orders   CBC with Differential/Platelet (Completed)   CMP14+EGFR (Completed)   Hemoglobin A1c (Completed)   Lipid panel (Completed)   Microalbumin / creatinine urine ratio (Completed)   HM Diabetes Foot Exam (Completed)   Hepatitis C antibody (Completed)   HIV Antibody (routine testing w rflx) (Completed)   VITAMIN D 25 Hydroxy (Vit-D Deficiency, Fractures) (Completed)   Tear of left rotator cuff    Upcoming surgery for a shoulder tear involving the rotator cuff. -Labs monitored today. -No concerns present at this time that would limit the ability to have this surgery from primary care standpoint.       Other Visit Diagnoses     Need for COVID-19 vaccine       Relevant Orders   Pfizer Comirnaty Covid -19 Vaccine 45yrs and older (Completed)   Need for influenza vaccination       Relevant Orders   Flu vaccine trivalent PF, 6mos and older(Flulaval,Afluria,Fluarix,Fluzone) (Completed)         Follow up plan: Return in about 6 months (around 03/01/2024) for Med Management 30.  NEXT PREVENTATIVE PHYSICAL DUE IN 1 YEAR.  PATIENT COUNSELING PROVIDED FOR ALL  ADULT PATIENTS: A well balanced diet low in saturated fats, cholesterol, and moderation in carbohydrates.  This can be as simple as monitoring portion sizes and cutting back on sugary beverages such as soda and juice to start with.    Daily water consumption of at least 64 ounces.  Physical activity at least 180 minutes per week.  If just starting out, start 10 minutes a day and work your way up.   This can be as simple as taking the stairs instead of the elevator and walking 2-3 laps around the office  purposefully every day.   STD protection, partner selection, and regular testing if high risk.  Limited consumption of alcoholic beverages if alcohol is consumed. For men, I recommend no more than 14 alcoholic beverages per week, spread out throughout the week (max 2 per day). Avoid "binge" drinking or consuming  large quantities of alcohol in one setting.  Please let me know if you feel you may need help with reduction or quitting alcohol consumption.   Avoidance of nicotine, if used. Please let me know if you feel you may need help with reduction or quitting nicotine use.   Daily mental health attention. This can be in the form of 5 minute daily meditation, prayer, journaling, yoga, reflection, etc.  Purposeful attention to your emotions and mental state can significantly improve your overall wellbeing  and  Health.  Please know that I am here to help you with all of your health care goals and am happy to work with you to find a solution that works best for you.  The greatest advice I have received with any changes in life are to take it one step at a time, that even means if all you can focus on is the next 60 seconds, then do that and celebrate your victories.  With any changes in life, you will have set backs, and that is OK. The important thing to remember is, if you have a set back, it is not a failure, it is an opportunity to try again! Screening Testing Mammogram Every 1 -2 years based on history and risk factors Starting at age 64 Pap Smear Ages 21-39 every 3 years Ages 27-65 every 5 years with HPV testing More frequent testing may be required based on results and history Colon Cancer Screening Every 1-10 years based on test performed, risk factors, and history Starting at age 4 Bone Density Screening Every 2-10 years based on history Starting at age 91 for women Recommendations for men differ based on medication usage, history, and risk factors AAA Screening One time ultrasound Men 65-53 years old who have every smoked Lung Cancer Screening Low Dose Lung CT every 12 months Age 52-80 years with a 30 pack-year smoking history who still smoke or who have quit within the last 15 years   Screening Labs Routine  Labs: Complete Blood Count (CBC), Complete Metabolic Panel (CMP),  Cholesterol (Lipid Panel) Every 6-12 months based on history and medications May be recommended more frequently based on current conditions or previous results Hemoglobin A1c Lab Every 3-12 months based on history and previous results Starting at age 53 or earlier with diagnosis of diabetes, high cholesterol, BMI >26, and/or risk factors Frequent monitoring for patients with diabetes to ensure blood sugar control Thyroid Panel (TSH) Every 6 months based on history, symptoms, and risk factors May be repeated more often if on medication HIV One time testing for all patients 75 and older May be repeated more frequently  for patients with increased risk factors or exposure Hepatitis C One time testing for all patients 18 and older May be repeated more frequently for patients with increased risk factors or exposure Gonorrhea, Chlamydia Every 12 months for all sexually active persons 13-24 years Additional monitoring may be recommended for those who are considered high risk or who have symptoms Every 12 months for any woman on birth control, regardless of sexual activity PSA Men 62-19 years old with risk factors Additional screening may be recommended from age 71-69 based on risk factors, symptoms, and history  Vaccine Recommendations Tetanus Booster All adults every 10 years Flu Vaccine All patients 6 months and older every year COVID Vaccine All patients 12 years and older Initial dosing with booster May recommend additional booster based on age and health history HPV Vaccine 2 doses all patients age 47-26 Dosing may be considered for patients over 26 Shingles Vaccine (Shingrix) 2 doses all adults 55 years and older Pneumonia (Pneumovax 52) All adults 65 years and older May recommend earlier dosing based on health history One year apart from Prevnar 7 Pneumonia (Prevnar 2) All adults 65 years and older Dosed 1 year after Pneumovax 23 Pneumonia (Prevnar 20) One time  alternative to the two dosing of 13 and 23 For all adults with initial dose of 23, 20 is recommended 1 year later For all adults with initial dose of 13, 23 is still recommended as second option 1 year later

## 2023-09-02 NOTE — Telephone Encounter (Signed)
Faxed request for albuterol inhaler. Came in for physical today.

## 2023-09-02 NOTE — Assessment & Plan Note (Signed)

## 2023-09-02 NOTE — Patient Instructions (Addendum)
We will plan to increase the [facility] if the labs look ok. I will send that in for you.   I will see if there is anything that can help with the dry mouth other than the biotene.

## 2023-09-02 NOTE — Assessment & Plan Note (Signed)
Blood sugars well controlled at home with readings around 117. Request for a new glucose monitor kit. -Order a new glucose monitor kit based on insurance. Sent to [facility] -Check A1c to assess control -Plan to increase Mounjaro to 7.5mg 

## 2023-09-02 NOTE — Assessment & Plan Note (Signed)
Upcoming surgery for a shoulder tear involving the rotator cuff. -Labs monitored today. -No concerns present at this time that would limit the ability to have this surgery from primary care standpoint.

## 2023-09-04 ENCOUNTER — Encounter: Payer: Self-pay | Admitting: Orthopedic Surgery

## 2023-09-04 LAB — CBC WITH DIFFERENTIAL/PLATELET
Basophils Absolute: 0.1 10*3/uL (ref 0.0–0.2)
Basos: 1 %
EOS (ABSOLUTE): 0.3 10*3/uL (ref 0.0–0.4)
Eos: 3 %
Hematocrit: 44.7 % (ref 34.0–46.6)
Hemoglobin: 14.7 g/dL (ref 11.1–15.9)
Immature Grans (Abs): 0 10*3/uL (ref 0.0–0.1)
Immature Granulocytes: 0 %
Lymphocytes Absolute: 2.3 10*3/uL (ref 0.7–3.1)
Lymphs: 25 %
MCH: 28.4 pg (ref 26.6–33.0)
MCHC: 32.9 g/dL (ref 31.5–35.7)
MCV: 86 fL (ref 79–97)
Monocytes Absolute: 0.5 10*3/uL (ref 0.1–0.9)
Monocytes: 6 %
Neutrophils Absolute: 6.1 10*3/uL (ref 1.4–7.0)
Neutrophils: 65 %
Platelets: 371 10*3/uL (ref 150–450)
RBC: 5.18 x10E6/uL (ref 3.77–5.28)
RDW: 13.3 % (ref 11.7–15.4)
WBC: 9.3 10*3/uL (ref 3.4–10.8)

## 2023-09-04 LAB — HIV ANTIBODY (ROUTINE TESTING W REFLEX): HIV Screen 4th Generation wRfx: NONREACTIVE

## 2023-09-04 LAB — CMP14+EGFR
ALT: 24 IU/L (ref 0–32)
AST: 21 IU/L (ref 0–40)
Albumin: 3.7 g/dL — ABNORMAL LOW (ref 3.9–4.9)
Alkaline Phosphatase: 83 [IU]/L (ref 44–121)
BUN/Creatinine Ratio: 14 (ref 9–23)
BUN: 13 mg/dL (ref 6–24)
Bilirubin Total: <0.2 mg/dL (ref 0.0–1.2)
CO2: 27 mmol/L (ref 20–29)
Calcium: 9.3 mg/dL (ref 8.7–10.2)
Chloride: 107 mmol/L — ABNORMAL HIGH (ref 96–106)
Creatinine, Ser: 0.9 mg/dL (ref 0.57–1.00)
Globulin, Total: 1.8 g/dL (ref 1.5–4.5)
Glucose: 100 mg/dL — ABNORMAL HIGH (ref 70–99)
Potassium: 4.1 mmol/L (ref 3.5–5.2)
Sodium: 145 mmol/L — ABNORMAL HIGH (ref 134–144)
Total Protein: 5.5 g/dL — ABNORMAL LOW (ref 6.0–8.5)
eGFR: 79 mL/min/{1.73_m2} (ref >59)

## 2023-09-04 LAB — HEMOGLOBIN A1C
Est. average glucose Bld gHb Est-mCnc: 128 mg/dL
Hgb A1c MFr Bld: 6.1 % — ABNORMAL HIGH (ref 4.8–5.6)

## 2023-09-04 LAB — MICROALBUMIN / CREATININE URINE RATIO
Creatinine, Urine: 78.7 mg/dL
Microalb/Creat Ratio: 8 mg/g{creat} (ref 0–29)
Microalbumin, Urine: 6.5 ug/mL

## 2023-09-04 LAB — LIPID PANEL
Chol/HDL Ratio: 3.6 ratio (ref 0.0–4.4)
Cholesterol, Total: 163 mg/dL (ref 100–199)
HDL: 45 mg/dL (ref >39)
LDL Chol Calc (NIH): 101 mg/dL — ABNORMAL HIGH (ref 0–99)
Triglycerides: 93 mg/dL (ref 0–149)
VLDL Cholesterol Cal: 17 mg/dL (ref 5–40)

## 2023-09-04 LAB — HEPATITIS C ANTIBODY: Hep C Virus Ab: NONREACTIVE

## 2023-09-04 LAB — VITAMIN D 25 HYDROXY (VIT D DEFICIENCY, FRACTURES): Vit D, 25-Hydroxy: 46.7 ng/mL (ref 30.0–100.0)

## 2023-09-05 ENCOUNTER — Telehealth: Payer: Self-pay

## 2023-09-05 ENCOUNTER — Other Ambulatory Visit: Payer: Self-pay

## 2023-09-05 NOTE — Telephone Encounter (Signed)
See note from Dr August Saucer. Please advise.

## 2023-09-05 NOTE — Telephone Encounter (Signed)
-----   Message from Burnard Bunting sent at 09/04/2023  9:04 PM EST ----- Did she get set up for surgery?  Or does Debbie have blue sheet?

## 2023-09-05 NOTE — Telephone Encounter (Signed)
Faxed request for BD single use swab and true metrix level 1 ctrl soln.

## 2023-09-06 ENCOUNTER — Other Ambulatory Visit: Payer: Self-pay

## 2023-09-06 DIAGNOSIS — J4551 Severe persistent asthma with (acute) exacerbation: Secondary | ICD-10-CM

## 2023-09-06 MED ORDER — ALBUTEROL SULFATE 1.25 MG/3ML IN NEBU: 1.0000 | INHALATION_SOLUTION | Freq: Four times a day (QID) | RESPIRATORY_TRACT | 3 refills | Status: DC | PRN

## 2023-09-06 MED ORDER — TRUE METRIX AIR GLUCOSE METER DEVI: 1.0000 | Freq: Every day | 0 refills | Status: DC

## 2023-09-07 ENCOUNTER — Encounter: Payer: Self-pay | Admitting: Nurse Practitioner

## 2023-09-08 NOTE — Assessment & Plan Note (Signed)
Breathing currently stable with no concerns presented. Continue to monitor.

## 2023-09-08 NOTE — Assessment & Plan Note (Signed)
>>  ASSESSMENT AND PLAN FOR ASTHMA WRITTEN ON 09/08/2023  7:09 AM BY Carly Applegate E, NP  Breathing currently stable with no concerns presented. Continue to monitor.

## 2023-09-12 NOTE — Assessment & Plan Note (Signed)
No symptoms at this time. Will monitor

## 2023-09-12 NOTE — Assessment & Plan Note (Signed)
Chronic. BP stable. No alarm symptoms present at this time.  Medication: taking as prescribed, HCTZ. Goal blood pressure less than less than 130/80. Currently is not followed with cardiology. Comorbid conditions include DM, HLD Plan: current treatment plan is effective, no change in therapy, orders and follow up as documented in EpicCare.  Refills have been provided today. Labs today to check electrolytes and kidney function.  Will make changes to plan of care as necessary based on lab results.  Plan to follow-up in 6months.

## 2023-09-12 NOTE — Assessment & Plan Note (Signed)
Currently managed with statin therapy. No symptoms at this time. Will monitor

## 2023-09-12 NOTE — Assessment & Plan Note (Signed)
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 -Prescribe a cream for dyshidrotic eczema to be applied at bedtime.

## 2023-09-12 NOTE — Assessment & Plan Note (Signed)
Not currently on medications for management. Labs pending.

## 2023-09-12 NOTE — Assessment & Plan Note (Signed)
Stable. No reported symptoms.

## 2023-09-12 NOTE — Assessment & Plan Note (Signed)
Repeat labs:

## 2023-09-12 NOTE — Assessment & Plan Note (Signed)
Chronic. Currently on Jardiance. No alarm symptoms present. Labs pending for monitoring.  -Ensure that you are keeping your blood sugars in tight range and staying well hydrated.  -Consider checking your blood pressure once a week to make sure it is staying lower than 130/80 on average.

## 2023-09-12 NOTE — Assessment & Plan Note (Signed)
Chronic. Currently working on diet and exercise along with mounjaro for diabetes. Recommend strict monitoring of intake and output to reduce calorie burden. Exercise of at least 20 minutes every day to get sustained elevated heart rate and break a sweat. Labs pending.

## 2023-09-14 ENCOUNTER — Ambulatory Visit: Payer: Medicare HMO | Admitting: Orthopedic Surgery

## 2023-09-23 ENCOUNTER — Telehealth: Payer: Self-pay

## 2023-09-23 NOTE — Telephone Encounter (Signed)
p2p?

## 2023-09-23 NOTE — Telephone Encounter (Signed)
[employer] is requesting additional information before they can approve pts upcoming surgery. They are needing   Conservative therapy - including dates and duration Physical therapy with physician-directed HEP of the left shoulder.  P2P is an option as well. Please advise

## 2023-09-26 ENCOUNTER — Telehealth: Payer: Self-pay | Admitting: Nurse Practitioner

## 2023-09-26 NOTE — Telephone Encounter (Signed)
Fax from [employer] Metrix air glucose meter

## 2023-09-27 ENCOUNTER — Telehealth: Payer: Self-pay

## 2023-09-27 NOTE — Telephone Encounter (Signed)
The peer to peer for 11/27 has been cancelled with [employer] for this patient. Eunice Blase states pt wants to have procedure done in January and will have new insurance for 2025.

## 2023-09-27 NOTE — Telephone Encounter (Signed)
Ok thx.

## 2023-10-06 DIAGNOSIS — Z01818 Encounter for other preprocedural examination: Secondary | ICD-10-CM

## 2023-10-10 ENCOUNTER — Telehealth: Payer: Self-pay | Admitting: Nurse Practitioner

## 2023-10-10 NOTE — Telephone Encounter (Signed)
Pt Called & states she still hasn't gotten the True Metrix glucose meter,  please sent to local pharmacy [facility] [address], & also needs refill Baclofen TID 10mg 

## 2023-10-11 ENCOUNTER — Other Ambulatory Visit: Payer: Self-pay

## 2023-10-11 MED ORDER — TRUE METRIX AIR GLUCOSE METER DEVI: 1.0000 | Freq: Every day | 0 refills | Status: DC

## 2023-10-14 ENCOUNTER — Other Ambulatory Visit: Payer: Self-pay | Admitting: Nurse Practitioner

## 2023-10-14 MED ORDER — BACLOFEN 10 MG PO TABS: 10.0000 mg | ORAL_TABLET | Freq: Three times a day (TID) | ORAL | Status: DC

## 2023-10-14 MED ORDER — BACLOFEN 10 MG PO TABS: 30.0000 mg | ORAL_TABLET | Freq: Three times a day (TID) | ORAL | 3 refills | Status: DC

## 2023-10-14 MED ORDER — BACLOFEN 10 MG PO TABS: 10.0000 mg | ORAL_TABLET | Freq: Three times a day (TID) | ORAL | Status: DC | PRN

## 2023-10-19 ENCOUNTER — Other Ambulatory Visit: Payer: Self-pay | Admitting: Nurse Practitioner

## 2023-10-19 DIAGNOSIS — E782 Mixed hyperlipidemia: Secondary | ICD-10-CM

## 2023-10-19 DIAGNOSIS — J4541 Moderate persistent asthma with (acute) exacerbation: Secondary | ICD-10-CM

## 2023-10-19 DIAGNOSIS — E1165 Type 2 diabetes mellitus with hyperglycemia: Secondary | ICD-10-CM

## 2023-10-19 DIAGNOSIS — B9689 Other specified bacterial agents as the cause of diseases classified elsewhere: Secondary | ICD-10-CM

## 2023-10-19 DIAGNOSIS — E1121 Type 2 diabetes mellitus with diabetic nephropathy: Secondary | ICD-10-CM

## 2023-10-19 DIAGNOSIS — I10 Essential (primary) hypertension: Secondary | ICD-10-CM

## 2023-10-19 DIAGNOSIS — K2100 Gastro-esophageal reflux disease with esophagitis, without bleeding: Secondary | ICD-10-CM

## 2023-10-23 ENCOUNTER — Other Ambulatory Visit: Payer: Self-pay | Admitting: Nurse Practitioner

## 2023-10-23 DIAGNOSIS — E782 Mixed hyperlipidemia: Secondary | ICD-10-CM

## 2023-10-23 DIAGNOSIS — Z0000 Encounter for general adult medical examination without abnormal findings: Secondary | ICD-10-CM

## 2023-10-23 DIAGNOSIS — E1121 Type 2 diabetes mellitus with diabetic nephropathy: Secondary | ICD-10-CM

## 2023-10-23 DIAGNOSIS — E1165 Type 2 diabetes mellitus with hyperglycemia: Secondary | ICD-10-CM

## 2023-10-23 DIAGNOSIS — I10 Essential (primary) hypertension: Secondary | ICD-10-CM

## 2023-11-09 ENCOUNTER — Telehealth: Payer: Self-pay | Admitting: Nurse Practitioner

## 2023-11-09 DIAGNOSIS — M5416 Radiculopathy, lumbar region: Secondary | ICD-10-CM

## 2023-11-09 NOTE — Telephone Encounter (Signed)
 Pt states the dosage for the baclofen is incorrect should be #3 tablets 3 times a day.  Asks that you send in new rx with correct dosage to [facility]

## 2023-11-09 NOTE — Telephone Encounter (Signed)
 Pt states has new ins & needs all her meds sent into [facility] mail order fax # [phone] for 90 days, also needs new nebulizer machine

## 2023-11-10 ENCOUNTER — Other Ambulatory Visit: Payer: Self-pay

## 2023-11-10 DIAGNOSIS — E1121 Type 2 diabetes mellitus with diabetic nephropathy: Secondary | ICD-10-CM

## 2023-11-10 DIAGNOSIS — I10 Essential (primary) hypertension: Secondary | ICD-10-CM

## 2023-11-10 DIAGNOSIS — B9689 Other specified bacterial agents as the cause of diseases classified elsewhere: Secondary | ICD-10-CM

## 2023-11-10 DIAGNOSIS — K2100 Gastro-esophageal reflux disease with esophagitis, without bleeding: Secondary | ICD-10-CM

## 2023-11-10 DIAGNOSIS — E782 Mixed hyperlipidemia: Secondary | ICD-10-CM

## 2023-11-10 DIAGNOSIS — J4541 Moderate persistent asthma with (acute) exacerbation: Secondary | ICD-10-CM

## 2023-11-10 DIAGNOSIS — E1165 Type 2 diabetes mellitus with hyperglycemia: Secondary | ICD-10-CM

## 2023-11-10 MED ORDER — TELMISARTAN 40 MG PO TABS: 40.0000 mg | ORAL_TABLET | Freq: Every day | ORAL | 1 refills | Status: DC

## 2023-11-10 MED ORDER — METFORMIN HCL ER 500 MG PO TB24: 1000.0000 mg | ORAL_TABLET | Freq: Two times a day (BID) | ORAL | 1 refills | Status: DC

## 2023-11-10 MED ORDER — MONTELUKAST SODIUM 10 MG PO TABS: 10.0000 mg | ORAL_TABLET | Freq: Every day | ORAL | 1 refills | Status: DC

## 2023-11-10 MED ORDER — GABAPENTIN 300 MG PO CAPS
1500.0000 mg | ORAL_CAPSULE | Freq: Three times a day (TID) | ORAL | 1 refills | Status: DC
Start: 2023-11-10 — End: 2024-02-01

## 2023-11-10 MED ORDER — ATORVASTATIN CALCIUM 20 MG PO TABS: 20.0000 mg | ORAL_TABLET | Freq: Every day | ORAL | 1 refills | Status: DC

## 2023-11-10 MED ORDER — HYDROCHLOROTHIAZIDE 12.5 MG PO TABS: 12.5000 mg | ORAL_TABLET | Freq: Every day | ORAL | 1 refills | Status: DC

## 2023-11-10 MED ORDER — RABEPRAZOLE SODIUM 20 MG PO TBEC: 20.0000 mg | DELAYED_RELEASE_TABLET | Freq: Every day | ORAL | 1 refills | Status: DC

## 2023-11-10 MED ORDER — MECLIZINE HCL 25 MG PO TABS: 25.0000 mg | ORAL_TABLET | Freq: Three times a day (TID) | ORAL | 1 refills | Status: DC

## 2023-11-10 MED ORDER — JARDIANCE 25 MG PO TABS: 25.0000 mg | ORAL_TABLET | Freq: Every day | ORAL | 1 refills | Status: DC

## 2023-11-10 NOTE — Telephone Encounter (Signed)
 Needs new nebulizer machine also please

## 2023-11-11 DIAGNOSIS — J45998 Other asthma: Secondary | ICD-10-CM | POA: Diagnosis not present

## 2023-11-11 NOTE — Telephone Encounter (Signed)
 Please contact patient to ask if she is taking this every day at 30mg  a day, how long she has been on this medication at this dose, and what chronic condition she takes this for.  I need this information to help justify her continuing a higher than maximum dose recommendation (80mg  is daily max).   Also, please let her know that one of the biggest side effects of the medication is vertigo. Did she notice her vertigo start around the time she started this medication?

## 2023-11-11 NOTE — Telephone Encounter (Signed)
 done

## 2023-11-14 ENCOUNTER — Other Ambulatory Visit: Payer: Self-pay

## 2023-11-14 MED ORDER — TRUE METRIX AIR GLUCOSE METER DEVI: 1.0000 | Freq: Every day | 0 refills | Status: AC

## 2023-11-16 MED ORDER — BACLOFEN 10 MG PO TABS: 30.0000 mg | ORAL_TABLET | Freq: Three times a day (TID) | ORAL | 11 refills | Status: DC

## 2023-11-17 NOTE — Pre-Procedure Instructions (Signed)
[place]- Preparing for Total Shoulder Arthroplasty  Before surgery, you can play an important role. Because skin is not sterile, your skin needs to be as free of germs as possible. You can reduce the number of germs on your skin by using the following products.   Benzoyl Peroxide Gel  o Reduces the number of germs present on the skin  o Applied twice a day to shoulder area starting two days before surgery  ==================================================================  Please follow these instructions carefully:  BENZOYL PEROXIDE 5% GEL  Please do not use if you have an allergy to benzoyl peroxide. If your skin becomes reddened/irritated stop using the benzoyl peroxide.  Starting two days before surgery, apply as follows:  1. Apply benzoyl peroxide in the morning and at night. Apply after taking a shower. If you are not taking a shower clean entire shoulder front, back, and side along with the armpit with a clean wet washcloth.  2. Place a quarter-sized dollop on your SHOULDER and rub in thoroughly, making sure to cover the front, back, and side of your shoulder, along with the armpit.   2 Days prior to Surgery First Dose on _____________ Morning Second Dose on ______________ Night  Day Before Surgery First Dose on ______________ Morning Night before surgery wash (entire body except face and private areas) with CHG Soap THEN Second Dose on ____________ Night   Morning of Surgery  wash BODY AGAIN with CHG Soap   4. Do NOT apply benzoyl peroxide gel on the day of surgery

## 2023-11-17 NOTE — Pre-Procedure Instructions (Signed)
Surgical Instructions   Your procedure is scheduled on Thursday, January 23rd. Report to [facility] Main Entrance "A" at 05:30 A.M., then check in with the Admitting office. Any questions or running late day of surgery: call [phone]  Questions prior to your surgery date: call [phone], Monday-Friday, 8am-4pm. If you experience any cold or flu symptoms such as cough, fever, chills, shortness of breath, etc. between now and your scheduled surgery, please notify us at the above number.     Remember:  Do not eat after midnight the night before your surgery   You may drink clear liquids until 04:30 AM the morning of your surgery.   Clear liquids allowed are: Water, Non-Citrus Juices (without pulp), Carbonated Beverages, Clear Tea (no milk, honey, etc.), Black Coffee Only (NO MILK, CREAM OR POWDERED CREAMER of any kind), and Gatorade.  Patient Instructions  The night before surgery:  No food after midnight. ONLY clear liquids after midnight   The day of surgery (if you have diabetes): Drink ONE (1) 12 oz G2 given to you in your pre admission testing appointment by 04:30 AM the morning of surgery. Drink in one sitting. Do not sip.  This drink was given to you during your hospital  pre-op appointment visit.  Nothing else to drink after completing the  12 oz bottle of G2.         If you have questions, please contact your surgeon's office.    Take these medicines the morning of surgery with A SIP OF WATER  atorvastatin (LIPITOR)  baclofen (LIORESAL)  Budeson-Glycopyrrol-Formoterol (BREZTRI AEROSPHERE)  cyclobenzaprine (FLEXERIL)  gabapentin (NEURONTIN)  meclizine (ANTIVERT)  RABEprazole (ACIPHEX)  SYMBICORT     May take these medicines IF NEEDED: albuterol (ACCUNEB)  albuterol (VENTOLIN HFA)- bring inhaler with you on day of surgery budesonide (PULMICORT)  ipratropium (ATROVENT)  revefenacin (YUPELRI)   One week prior to surgery, STOP taking any Aspirin (unless  otherwise instructed by your surgeon) Aleve, Naproxen, Ibuprofen, Motrin, Advil, meloxicam (MOBIC), Goody's, BC's, all herbal medications, fish oil, and non-prescription vitamins.  WHAT DO I DO ABOUT MY DIABETES MEDICATION?   Do not take metFORMIN (GLUCOPHAGE-XR) the morning of surgery.  Hold JARDIANCE 72 hours prior to surgery. Last dose 1/19.        Stop taking tirzepatide [facility]) for 7 days prior to surgery. Last dose on or before 1/15.    HOW TO MANAGE YOUR DIABETES BEFORE AND AFTER SURGERY  Why is it important to control my blood sugar before and after surgery? Improving blood sugar levels before and after surgery helps healing and can limit problems. A way of improving blood sugar control is eating a healthy diet by:  Eating less sugar and carbohydrates  Increasing activity/exercise  Talking with your doctor about reaching your blood sugar goals High blood sugars (greater than 180 mg/dL) can raise your risk of infections and slow your recovery, so you will need to focus on controlling your diabetes during the weeks before surgery. Make sure that the doctor who takes care of your diabetes knows about your planned surgery including the date and location.  How do I manage my blood sugar before surgery? Check your blood sugar at least 4 times a day, starting 2 days before surgery, to make sure that the level is not too high or low.  Check your blood sugar the morning of your surgery when you wake up and every 2 hours until you get to the Short Stay unit.  If your blood sugar  is less than 70 mg/dL, you will need to treat for low blood sugar: Do not take insulin. Treat a low blood sugar (less than 70 mg/dL) with  cup of clear juice (cranberry or apple), 4 glucose tablets, OR glucose gel. Recheck blood sugar in 15 minutes after treatment (to make sure it is greater than 70 mg/dL). If your blood sugar is not greater than 70 mg/dL on recheck, call [phone] for further  instructions. Report your blood sugar to the short stay nurse when you get to Short Stay.  If you are admitted to the hospital after surgery: Your blood sugar will be checked by the staff and you will probably be given insulin after surgery (instead of oral diabetes medicines) to make sure you have good blood sugar levels. The goal for blood sugar control after surgery is 80-180 mg/dL.                     Do NOT Smoke (Tobacco/Vaping) for 24 hours prior to your procedure.  If you use a CPAP at night, you may bring your mask/headgear for your overnight stay.   You will be asked to remove any contacts, glasses, piercing's, hearing aid's, dentures/partials prior to surgery. Please bring cases for these items if needed.    Patients discharged the day of surgery will not be allowed to drive home, and someone needs to stay with them for 24 hours.  SURGICAL WAITING ROOM VISITATION Patients may have no more than 2 support people in the waiting area - these visitors may rotate.   Pre-op nurse will coordinate an appropriate time for 1 ADULT support person, who may not rotate, to accompany patient in pre-op.  Children under the age of 84 must have an adult with them who is not the patient and must remain in the main waiting area with an adult.  If the patient needs to stay at the hospital during part of their recovery, the visitor guidelines for inpatient rooms apply.  Please refer to the [facility] website for the visitor guidelines for any additional information.   If you received a COVID test during your pre-op visit  it is requested that you wear a mask when out in public, stay away from anyone that may not be feeling well and notify your surgeon if you develop symptoms. If you have been in contact with anyone that has tested positive in the last 10 days please notify you surgeon.      Pre-operative CHG Bathing Instructions   You can play a key role in reducing the risk of infection after  surgery. Your skin needs to be as free of germs as possible. You can reduce the number of germs on your skin by washing with CHG (chlorhexidine gluconate) soap before surgery. CHG is an antiseptic soap that kills germs and continues to kill germs even after washing.   DO NOT use if you have an allergy to chlorhexidine/CHG or antibacterial soaps. If your skin becomes reddened or irritated, stop using the CHG and notify one of our RNs at [phone].              TAKE A SHOWER THE NIGHT BEFORE SURGERY AND THE DAY OF SURGERY    Please keep in mind the following:  DO NOT shave, including legs and underarms, 48 hours prior to surgery.   You may shave your face before/day of surgery.  Place clean sheets on your bed the night before surgery Use a clean washcloth (  not used since being washed) for each shower. DO NOT sleep with pet's night before surgery.  CHG Shower Instructions:  Wash your face and private area with normal soap. If you choose to wash your hair, wash first with your normal shampoo.  After you use shampoo/soap, rinse your hair and body thoroughly to remove shampoo/soap residue.  Turn the water OFF and apply half the bottle of CHG soap to a CLEAN washcloth.  Apply CHG soap ONLY FROM YOUR NECK DOWN TO YOUR TOES (washing for 3-5 minutes)  DO NOT use CHG soap on face, private areas, open wounds, or sores.  Pay special attention to the area where your surgery is being performed.  If you are having back surgery, having someone wash your back for you may be helpful. Wait 2 minutes after CHG soap is applied, then you may rinse off the CHG soap.  Pat dry with a clean towel  Put on clean pajamas    Additional instructions for the day of surgery: DO NOT APPLY any lotions, deodorants, cologne, or perfumes.   Do not wear jewelry or makeup Do not wear nail polish, gel polish, artificial nails, or any other type of covering on natural nails (fingers and toes) Do not bring valuables to the  hospital. [facility] is not responsible for valuables/personal belongings. Put on clean/comfortable clothes.  Please brush your teeth.  Ask your nurse before applying any prescription medications to the skin.   [place]- Preparing for Total Shoulder Arthroplasty  Before surgery, you can play an important role. Because skin is not sterile, your skin needs to be as free of germs as possible. You can reduce the number of germs on your skin by using the following products.   Benzoyl Peroxide Gel  o Reduces the number of germs present on the skin  o Applied twice a day to shoulder area starting two days before surgery  ==================================================================  Please follow these instructions carefully:  BENZOYL PEROXIDE 5% GEL  Please do not use if you have an allergy to benzoyl peroxide. If your skin becomes reddened/irritated stop using the benzoyl peroxide.  Starting two days before surgery, apply as follows:  1. Apply benzoyl peroxide in the morning and at night. Apply after taking a shower. If you are not taking a shower clean entire shoulder front, back, and side along with the armpit with a clean wet washcloth.  2. Place a quarter-sized dollop on your SHOULDER and rub in thoroughly, making sure to cover the front, back, and side of your shoulder, along with the armpit.   2 Days prior to Surgery First Dose on _____________ Morning Second Dose on ______________ Night  Day Before Surgery First Dose on ______________ Morning Night before surgery wash (entire body except face and private areas) with CHG Soap THEN Second Dose on ____________ Night   Morning of Surgery  wash BODY AGAIN with CHG Soap   4. Do NOT apply benzoyl peroxide gel on the day of surgery

## 2023-11-17 NOTE — Pre-Procedure Instructions (Signed)
Surgical Instructions   Your procedure is scheduled on Thursday, January 23rd. Report to [facility] Main Entrance "A" at 05:30 A.M., then check in with the Admitting office. Any questions or running late day of surgery: call [phone]  Questions prior to your surgery date: call [phone], Monday-Friday, 8am-4pm. If you experience any cold or flu symptoms such as cough, fever, chills, shortness of breath, etc. between now and your scheduled surgery, please notify us at the above number.     Remember:  Do not eat after midnight the night before your surgery   You may drink clear liquids until 04:30 AM the morning of your surgery.   Clear liquids allowed are: Water, Non-Citrus Juices (without pulp), Carbonated Beverages, Clear Tea (no milk, honey, etc.), Black Coffee Only (NO MILK, CREAM OR POWDERED CREAMER of any kind), and Gatorade.  Patient Instructions  The night before surgery:  No food after midnight. ONLY clear liquids after midnight   The day of surgery (if you have diabetes): Drink ONE (1) 12 oz G2 given to you in your pre admission testing appointment by 04:30 AM the morning of surgery. Drink in one sitting. Do not sip.  This drink was given to you during your hospital  pre-op appointment visit.  Nothing else to drink after completing the  12 oz bottle of G2.         If you have questions, please contact your surgeon's office.    Take these medicines the morning of surgery with A SIP OF WATER  atorvastatin (LIPITOR)  baclofen (LIORESAL)  Budeson-Glycopyrrol-Formoterol (BREZTRI AEROSPHERE)  cyclobenzaprine (FLEXERIL)  gabapentin (NEURONTIN)  meclizine (ANTIVERT)  RABEprazole (ACIPHEX)  SYMBICORT     May take these medicines IF NEEDED: albuterol (ACCUNEB)  albuterol (VENTOLIN HFA)- bring inhaler with you on day of surgery budesonide (PULMICORT)  ipratropium (ATROVENT)  revefenacin (YUPELRI)   One week prior to surgery, STOP taking any Aspirin (unless  otherwise instructed by your surgeon) Aleve, Naproxen, Ibuprofen, Motrin, Advil, meloxicam (MOBIC), Goody's, BC's, all herbal medications, fish oil, and non-prescription vitamins.  WHAT DO I DO ABOUT MY DIABETES MEDICATION?   Do not take metFORMIN (GLUCOPHAGE-XR) the morning of surgery.  Hold JARDIANCE 72 hours prior to surgery. Last dose 1/19.        Stop taking tirzepatide [facility]) for 7 days prior to surgery. Last dose on or before 1/15.    HOW TO MANAGE YOUR DIABETES BEFORE AND AFTER SURGERY  Why is it important to control my blood sugar before and after surgery? Improving blood sugar levels before and after surgery helps healing and can limit problems. A way of improving blood sugar control is eating a healthy diet by:  Eating less sugar and carbohydrates  Increasing activity/exercise  Talking with your doctor about reaching your blood sugar goals High blood sugars (greater than 180 mg/dL) can raise your risk of infections and slow your recovery, so you will need to focus on controlling your diabetes during the weeks before surgery. Make sure that the doctor who takes care of your diabetes knows about your planned surgery including the date and location.  How do I manage my blood sugar before surgery? Check your blood sugar at least 4 times a day, starting 2 days before surgery, to make sure that the level is not too high or low.  Check your blood sugar the morning of your surgery when you wake up and every 2 hours until you get to the Short Stay unit.  If your blood sugar  is less than 70 mg/dL, you will need to treat for low blood sugar: Do not take insulin. Treat a low blood sugar (less than 70 mg/dL) with  cup of clear juice (cranberry or apple), 4 glucose tablets, OR glucose gel. Recheck blood sugar in 15 minutes after treatment (to make sure it is greater than 70 mg/dL). If your blood sugar is not greater than 70 mg/dL on recheck, call [phone] for further  instructions. Report your blood sugar to the short stay nurse when you get to Short Stay.  If you are admitted to the hospital after surgery: Your blood sugar will be checked by the staff and you will probably be given insulin after surgery (instead of oral diabetes medicines) to make sure you have good blood sugar levels. The goal for blood sugar control after surgery is 80-180 mg/dL.                     Do NOT Smoke (Tobacco/Vaping) for 24 hours prior to your procedure.  If you use a CPAP at night, you may bring your mask/headgear for your overnight stay.   You will be asked to remove any contacts, glasses, piercing's, hearing aid's, dentures/partials prior to surgery. Please bring cases for these items if needed.    Patients discharged the day of surgery will not be allowed to drive home, and someone needs to stay with them for 24 hours.  SURGICAL WAITING ROOM VISITATION Patients may have no more than 2 support people in the waiting area - these visitors may rotate.   Pre-op nurse will coordinate an appropriate time for 1 ADULT support person, who may not rotate, to accompany patient in pre-op.  Children under the age of 38 must have an adult with them who is not the patient and must remain in the main waiting area with an adult.  If the patient needs to stay at the hospital during part of their recovery, the visitor guidelines for inpatient rooms apply.  Please refer to the [facility] website for the visitor guidelines for any additional information.   If you received a COVID test during your pre-op visit  it is requested that you wear a mask when out in public, stay away from anyone that may not be feeling well and notify your surgeon if you develop symptoms. If you have been in contact with anyone that has tested positive in the last 10 days please notify you surgeon.      Pre-operative CHG Bathing Instructions   You can play a key role in reducing the risk of infection after  surgery. Your skin needs to be as free of germs as possible. You can reduce the number of germs on your skin by washing with CHG (chlorhexidine gluconate) soap before surgery. CHG is an antiseptic soap that kills germs and continues to kill germs even after washing.   DO NOT use if you have an allergy to chlorhexidine/CHG or antibacterial soaps. If your skin becomes reddened or irritated, stop using the CHG and notify one of our RNs at [phone].              TAKE A SHOWER THE NIGHT BEFORE SURGERY AND THE DAY OF SURGERY    Please keep in mind the following:  DO NOT shave, including legs and underarms, 48 hours prior to surgery.   You may shave your face before/day of surgery.  Place clean sheets on your bed the night before surgery Use a clean washcloth (  not used since being washed) for each shower. DO NOT sleep with pet's night before surgery.  CHG Shower Instructions:  Wash your face and private area with normal soap. If you choose to wash your hair, wash first with your normal shampoo.  After you use shampoo/soap, rinse your hair and body thoroughly to remove shampoo/soap residue.  Turn the water OFF and apply half the bottle of CHG soap to a CLEAN washcloth.  Apply CHG soap ONLY FROM YOUR NECK DOWN TO YOUR TOES (washing for 3-5 minutes)  DO NOT use CHG soap on face, private areas, open wounds, or sores.  Pay special attention to the area where your surgery is being performed.  If you are having back surgery, having someone wash your back for you may be helpful. Wait 2 minutes after CHG soap is applied, then you may rinse off the CHG soap.  Pat dry with a clean towel  Put on clean pajamas    Additional instructions for the day of surgery: DO NOT APPLY any lotions, deodorants, cologne, or perfumes.   Do not wear jewelry or makeup Do not wear nail polish, gel polish, artificial nails, or any other type of covering on natural nails (fingers and toes) Do not bring valuables to the  hospital. [facility] is not responsible for valuables/personal belongings. Put on clean/comfortable clothes.  Please brush your teeth.  Ask your nurse before applying any prescription medications to the skin.

## 2023-11-18 ENCOUNTER — Other Ambulatory Visit: Payer: Self-pay

## 2023-11-18 ENCOUNTER — Encounter (HOSPITAL_COMMUNITY)
Admission: RE | Admit: 2023-11-18 | Discharge: 2023-11-18 | Disposition: A | Discharge status: Home / Self Care | Payer: 59 | Source: Ambulatory Visit | Attending: Orthopedic Surgery | Admitting: Orthopedic Surgery

## 2023-11-18 ENCOUNTER — Encounter (HOSPITAL_COMMUNITY): Payer: Self-pay

## 2023-11-18 VITALS — BP 125/74 | HR 100 | Temp 98.4°F | Resp 18 | Ht 63.0 in | Wt 193.5 lb

## 2023-11-18 DIAGNOSIS — Z01818 Encounter for other preprocedural examination: Secondary | ICD-10-CM | POA: Insufficient documentation

## 2023-11-18 DIAGNOSIS — E119 Type 2 diabetes mellitus without complications: Secondary | ICD-10-CM | POA: Diagnosis not present

## 2023-11-18 HISTORY — DX: Sleep apnea, unspecified: G47.30

## 2023-11-18 LAB — BASIC METABOLIC PANEL
Anion gap: 9 (ref 5–15)
BUN: 13 mg/dL (ref 6–20)
CO2: 26 mmol/L (ref 22–32)
Calcium: 8.9 mg/dL (ref 8.9–10.3)
Chloride: 102 mmol/L (ref 98–111)
Creatinine, Ser: 0.87 mg/dL (ref 0.44–1.00)
GFR, Estimated: >60 mL/min (ref >60)
Glucose, Bld: 109 mg/dL — ABNORMAL HIGH (ref 70–99)
Potassium: 4.2 mmol/L (ref 3.5–5.1)
Sodium: 137 mmol/L (ref 135–145)

## 2023-11-18 LAB — CBC
HCT: 41.3 % (ref 36.0–46.0)
Hemoglobin: 13.7 g/dL (ref 12.0–15.0)
MCH: 28 pg (ref 26.0–34.0)
MCHC: 33.2 g/dL (ref 30.0–36.0)
MCV: 84.3 fL (ref 80.0–100.0)
Platelets: 329 10*3/uL (ref 150–400)
RBC: 4.9 MIL/uL (ref 3.87–5.11)
RDW: 15.2 % (ref 11.5–15.5)
WBC: 10 10*3/uL (ref 4.0–10.5)
nRBC: 0 % (ref 0.0–0.2)

## 2023-11-18 LAB — HEMOGLOBIN A1C
Hgb A1c MFr Bld: 6.2 % — ABNORMAL HIGH (ref 4.8–5.6)
Mean Plasma Glucose: 131.24 mg/dL

## 2023-11-18 LAB — GLUCOSE, CAPILLARY: Glucose-Capillary: 107 mg/dL — ABNORMAL HIGH (ref 70–99)

## 2023-11-18 NOTE — Progress Notes (Signed)
PCP - Enid Skeens, NP Cardiologist - denies  PPM/ICD - denies   Chest x-ray - 04/21/10 EKG - 11/18/23 Stress Test - denies ECHO - denies Cardiac Cath - denies  Sleep Study - OSA+ CPAP - pt states she lost 100 lbs and no longer needs to use a CPAP  Fasting Blood Sugar - 99-110 Checks Blood Sugar twice a day  Last dose of GLP1 agonist-  1/11 GLP1 instructions: Pt knows not to take any more doses prior to surgery  ASA/Blood Thinner Instructions: n/a   ERAS Protcol - yes PRE-SURGERY G2- given at PAT  COVID TEST- n/a   Anesthesia review: no  Patient denies shortness of breath, fever, cough and chest pain at PAT appointment   All instructions explained to the patient, with a verbal understanding of the material. Patient agrees to go over the instructions while at home for a better understanding.  The opportunity to ask questions was provided.

## 2023-11-21 ENCOUNTER — Other Ambulatory Visit: Payer: Self-pay

## 2023-11-21 DIAGNOSIS — E1165 Type 2 diabetes mellitus with hyperglycemia: Secondary | ICD-10-CM

## 2023-11-21 DIAGNOSIS — E1121 Type 2 diabetes mellitus with diabetic nephropathy: Secondary | ICD-10-CM

## 2023-11-21 DIAGNOSIS — Z0000 Encounter for general adult medical examination without abnormal findings: Secondary | ICD-10-CM

## 2023-11-21 DIAGNOSIS — I10 Essential (primary) hypertension: Secondary | ICD-10-CM

## 2023-11-21 DIAGNOSIS — E782 Mixed hyperlipidemia: Secondary | ICD-10-CM

## 2023-11-21 MED ORDER — ACCU-CHEK SOFTCLIX LANCETS MISC: 1 refills | Status: DC

## 2023-11-21 MED ORDER — ACCU-CHEK GUIDE TEST VI STRP: 1.0000 | ORAL_STRIP | Freq: Every day | 1 refills | Status: DC

## 2023-11-21 MED ORDER — ACCU-CHEK GUIDE W/DEVICE KIT: 1.0000 | PACK | Freq: Every day | 0 refills | Status: DC

## 2023-11-23 NOTE — Anesthesia Preprocedure Evaluation (Signed)
Anesthesia Evaluation  Patient identified by MRN, date of birth, ID band Patient awake    Reviewed: Allergy & Precautions, NPO status , Patient's Chart, lab work & pertinent test results, reviewed documented beta blocker date and time   Airway Mallampati: II       Dental  (+) Dental Advisory Given   Pulmonary asthma , sleep apnea and Continuous Positive Airway Pressure Ventilation , Current Smoker and Patient abstained from smoking.   Pulmonary exam normal breath sounds clear to auscultation       Cardiovascular hypertension, Pt. on medications Normal cardiovascular exam Rhythm:Regular Rate:Normal     Neuro/Psych  Neuromuscular disease  negative psych ROS   GI/Hepatic Neg liver ROS,GERD  Medicated,,  Endo/Other  diabetes, Well Controlled, Type 2, Oral Hypoglycemic Agents  Obesity HLD GLP-1 RA therapy- last dose  Renal/GU Renal disease  negative genitourinary   Musculoskeletal  (+) Arthritis , Osteoarthritis,  Left shoulder rotator cuff tear Biceps tendinitis Acromioclavicular joint arthritis   Abdominal  (+) + obese  Peds  Hematology negative hematology ROS (+)   Anesthesia Other Findings   Reproductive/Obstetrics                              Anesthesia Physical Anesthesia Plan  ASA: 3  Anesthesia Plan: General   Post-op Pain Management: Regional block* and Minimal or no pain anticipated   Induction:   PONV Risk Score and Plan: 3 and Treatment may vary due to age or medical condition, Midazolam, Ondansetron and Dexamethasone  Airway Management Planned: Oral ETT  Additional Equipment: None  Intra-op Plan:   Post-operative Plan: Extubation in OR  Informed Consent: I have reviewed the patients History and Physical, chart, labs and discussed the procedure including the risks, benefits and alternatives for the proposed anesthesia with the patient or authorized representative who  has indicated his/her understanding and acceptance.     Dental advisory given  Plan Discussed with: Anesthesiologist and CRNA  Anesthesia Plan Comments:          Anesthesia Quick Evaluation

## 2023-11-24 ENCOUNTER — Other Ambulatory Visit: Payer: Self-pay

## 2023-11-24 ENCOUNTER — Ambulatory Visit (HOSPITAL_COMMUNITY)
Admission: RE | Admit: 2023-11-24 | Discharge: 2023-11-24 | Disposition: A | Discharge status: Home / Self Care | Payer: 59 | Attending: Orthopedic Surgery | Admitting: Orthopedic Surgery

## 2023-11-24 ENCOUNTER — Ambulatory Visit (HOSPITAL_COMMUNITY): Payer: 59 | Admitting: Anesthesiology

## 2023-11-24 ENCOUNTER — Encounter (HOSPITAL_COMMUNITY): Payer: Self-pay | Admitting: Orthopedic Surgery

## 2023-11-24 ENCOUNTER — Encounter (HOSPITAL_COMMUNITY)
Admission: RE | Disposition: A | Discharge status: Home / Self Care | Payer: Self-pay | Source: Home / Self Care | Attending: Orthopedic Surgery

## 2023-11-24 DIAGNOSIS — M75122 Complete rotator cuff tear or rupture of left shoulder, not specified as traumatic: Secondary | ICD-10-CM | POA: Diagnosis not present

## 2023-11-24 DIAGNOSIS — M7522 Bicipital tendinitis, left shoulder: Secondary | ICD-10-CM | POA: Insufficient documentation

## 2023-11-24 DIAGNOSIS — M19012 Primary osteoarthritis, left shoulder: Secondary | ICD-10-CM | POA: Diagnosis not present

## 2023-11-24 DIAGNOSIS — Z7951 Long term (current) use of inhaled steroids: Secondary | ICD-10-CM | POA: Insufficient documentation

## 2023-11-24 DIAGNOSIS — K219 Gastro-esophageal reflux disease without esophagitis: Secondary | ICD-10-CM | POA: Diagnosis not present

## 2023-11-24 DIAGNOSIS — G8918 Other acute postprocedural pain: Secondary | ICD-10-CM | POA: Diagnosis not present

## 2023-11-24 DIAGNOSIS — M75102 Unspecified rotator cuff tear or rupture of left shoulder, not specified as traumatic: Secondary | ICD-10-CM | POA: Insufficient documentation

## 2023-11-24 DIAGNOSIS — F17210 Nicotine dependence, cigarettes, uncomplicated: Secondary | ICD-10-CM | POA: Diagnosis not present

## 2023-11-24 DIAGNOSIS — Z7984 Long term (current) use of oral hypoglycemic drugs: Secondary | ICD-10-CM | POA: Diagnosis not present

## 2023-11-24 DIAGNOSIS — G4730 Sleep apnea, unspecified: Secondary | ICD-10-CM | POA: Insufficient documentation

## 2023-11-24 DIAGNOSIS — M65912 Unspecified synovitis and tenosynovitis, left shoulder: Secondary | ICD-10-CM

## 2023-11-24 DIAGNOSIS — E785 Hyperlipidemia, unspecified: Secondary | ICD-10-CM | POA: Insufficient documentation

## 2023-11-24 DIAGNOSIS — Z79899 Other long term (current) drug therapy: Secondary | ICD-10-CM | POA: Diagnosis not present

## 2023-11-24 DIAGNOSIS — Z7985 Long-term (current) use of injectable non-insulin antidiabetic drugs: Secondary | ICD-10-CM | POA: Insufficient documentation

## 2023-11-24 DIAGNOSIS — S43432A Superior glenoid labrum lesion of left shoulder, initial encounter: Secondary | ICD-10-CM | POA: Diagnosis not present

## 2023-11-24 DIAGNOSIS — E119 Type 2 diabetes mellitus without complications: Secondary | ICD-10-CM | POA: Diagnosis not present

## 2023-11-24 DIAGNOSIS — J45909 Unspecified asthma, uncomplicated: Secondary | ICD-10-CM | POA: Insufficient documentation

## 2023-11-24 DIAGNOSIS — I10 Essential (primary) hypertension: Secondary | ICD-10-CM

## 2023-11-24 DIAGNOSIS — Z01818 Encounter for other preprocedural examination: Secondary | ICD-10-CM

## 2023-11-24 HISTORY — PX: SHOULDER ARTHROSCOPY WITH OPEN ROTATOR CUFF REPAIR AND DISTAL CLAVICLE ACROMINECTOMY: SHX5683

## 2023-11-24 LAB — GLUCOSE, CAPILLARY
Glucose-Capillary: 116 mg/dL — ABNORMAL HIGH (ref 70–99)
Glucose-Capillary: 162 mg/dL — ABNORMAL HIGH (ref 70–99)

## 2023-11-24 SURGERY — SHOULDER ARTHROSCOPY WITH OPEN ROTATOR CUFF REPAIR AND DISTAL CLAVICLE ACROMINECTOMY
Anesthesia: General | Laterality: Left

## 2023-11-24 MED ORDER — MIDAZOLAM HCL 2 MG/2ML IJ SOLN
INTRAMUSCULAR | Status: AC
  Filled 2023-11-24: qty 2

## 2023-11-24 MED ORDER — PHENYLEPHRINE HCL-NACL 20-0.9 MG/250ML-% IV SOLN
INTRAVENOUS | Status: AC
  Filled 2023-11-24: qty 750

## 2023-11-24 MED ORDER — EPINEPHRINE PF 1 MG/ML IJ SOLN
INTRAMUSCULAR | Status: AC
  Filled 2023-11-24: qty 4

## 2023-11-24 MED ORDER — CEFAZOLIN SODIUM-DEXTROSE 2-4 GM/100ML-% IV SOLN
2.0000 g | INTRAVENOUS | Status: AC
  Administered 2023-11-24: 2 g via INTRAVENOUS
  Filled 2023-11-24: qty 100

## 2023-11-24 MED ORDER — BUPIVACAINE HCL (PF) 0.25 % IJ SOLN
INTRAMUSCULAR | Status: AC
  Filled 2023-11-24: qty 30

## 2023-11-24 MED ORDER — BUPIVACAINE HCL (PF) 0.5 % IJ SOLN
INTRAMUSCULAR | Status: DC | PRN
Start: 2023-11-24 — End: 2023-11-24
  Administered 2023-11-24: 20 mL via PERINEURAL

## 2023-11-24 MED ORDER — MIDAZOLAM HCL 2 MG/2ML IJ SOLN
INTRAMUSCULAR | Status: DC | PRN
  Administered 2023-11-24: 1 mg via INTRAVENOUS

## 2023-11-24 MED ORDER — FENTANYL CITRATE (PF) 250 MCG/5ML IJ SOLN
INTRAMUSCULAR | Status: DC | PRN
  Administered 2023-11-24 (×3): 50 ug via INTRAVENOUS

## 2023-11-24 MED ORDER — SUGAMMADEX SODIUM 200 MG/2ML IV SOLN
INTRAVENOUS | Status: DC | PRN
  Administered 2023-11-24: 200 mg via INTRAVENOUS

## 2023-11-24 MED ORDER — FENTANYL CITRATE (PF) 250 MCG/5ML IJ SOLN
INTRAMUSCULAR | Status: AC
  Filled 2023-11-24: qty 5

## 2023-11-24 MED ORDER — OXYCODONE HCL 5 MG PO TABS: 5.0000 mg | ORAL_TABLET | Freq: Once | ORAL | Status: DC | PRN

## 2023-11-24 MED ORDER — DROPERIDOL 2.5 MG/ML IJ SOLN: 0.6250 mg | Freq: Once | INTRAMUSCULAR | Status: DC | PRN

## 2023-11-24 MED ORDER — EPHEDRINE 5 MG/ML INJ
INTRAVENOUS | Status: AC
Start: 2023-11-24 — End: ?
  Filled 2023-11-24: qty 5

## 2023-11-24 MED ORDER — HYDROMORPHONE HCL 1 MG/ML IJ SOLN: 0.2500 mg | INTRAMUSCULAR | Status: DC | PRN

## 2023-11-24 MED ORDER — ONDANSETRON HCL 4 MG/2ML IJ SOLN
4.0000 mg | Freq: Once | INTRAMUSCULAR | Status: DC | PRN
Start: 2023-11-24 — End: 2023-11-24

## 2023-11-24 MED ORDER — DEXAMETHASONE SODIUM PHOSPHATE 10 MG/ML IJ SOLN
INTRAMUSCULAR | Status: DC | PRN
  Administered 2023-11-24: 10 mg via INTRAVENOUS

## 2023-11-24 MED ORDER — BUPIVACAINE LIPOSOME 1.3 % IJ SUSP
INTRAMUSCULAR | Status: DC | PRN
Start: 2023-11-24 — End: 2023-11-24
  Administered 2023-11-24: 10 mL via PERINEURAL

## 2023-11-24 MED ORDER — PROPOFOL 10 MG/ML IV BOLUS
INTRAVENOUS | Status: DC | PRN
  Administered 2023-11-24: 160 mg via INTRAVENOUS

## 2023-11-24 MED ORDER — TRANEXAMIC ACID-NACL 1000-0.7 MG/100ML-% IV SOLN
1000.0000 mg | INTRAVENOUS | Status: AC
  Administered 2023-11-24: 1000 mg via INTRAVENOUS
  Filled 2023-11-24: qty 100

## 2023-11-24 MED ORDER — OXYCODONE HCL 5 MG/5ML PO SOLN: 5.0000 mg | Freq: Once | ORAL | Status: DC | PRN

## 2023-11-24 MED ORDER — ORAL CARE MOUTH RINSE
15.0000 mL | Freq: Once | OROMUCOSAL | Status: AC
Start: 2023-11-24 — End: 2023-11-24

## 2023-11-24 MED ORDER — SODIUM CHLORIDE 0.9 % IR SOLN
Status: DC | PRN
  Administered 2023-11-24 (×4): 3000 mL

## 2023-11-24 MED ORDER — EPINEPHRINE PF 1 MG/ML IJ SOLN
INTRAMUSCULAR | Status: AC
  Filled 2023-11-24: qty 1

## 2023-11-24 MED ORDER — PROPOFOL 10 MG/ML IV BOLUS
INTRAVENOUS | Status: AC
  Filled 2023-11-24: qty 20

## 2023-11-24 MED ORDER — LACTATED RINGERS IV SOLN
INTRAVENOUS | Status: DC
Start: 2023-11-24 — End: 2023-11-24

## 2023-11-24 MED ORDER — EPINEPHRINE PF 1 MG/ML IJ SOLN
INTRAMUSCULAR | Status: DC | PRN
  Administered 2023-11-24 (×4): 1 mL via INTRAMUSCULAR
  Administered 2023-11-24: .15 mL via INTRAMUSCULAR

## 2023-11-24 MED ORDER — PHENYLEPHRINE HCL-NACL 20-0.9 MG/250ML-% IV SOLN
INTRAVENOUS | Status: DC | PRN
  Administered 2023-11-24 (×2): 80 ug via INTRAVENOUS
  Administered 2023-11-24: 160 ug via INTRAVENOUS
  Administered 2023-11-24 (×3): 80 ug via INTRAVENOUS
  Administered 2023-11-24: 20 ug/min via INTRAVENOUS

## 2023-11-24 MED ORDER — BUPIVACAINE HCL (PF) 0.5 % IJ SOLN: INTRAMUSCULAR | Status: DC | PRN

## 2023-11-24 MED ORDER — EPHEDRINE SULFATE-NACL 50-0.9 MG/10ML-% IV SOSY
PREFILLED_SYRINGE | INTRAVENOUS | Status: DC | PRN
  Administered 2023-11-24 (×2): 10 mg via INTRAVENOUS
  Administered 2023-11-24: 5 mg via INTRAVENOUS

## 2023-11-24 MED ORDER — CHLORHEXIDINE GLUCONATE 0.12 % MT SOLN
15.0000 mL | Freq: Once | OROMUCOSAL | Status: AC
  Administered 2023-11-24: 15 mL via OROMUCOSAL
  Filled 2023-11-24: qty 15

## 2023-11-24 MED ORDER — OXYCODONE HCL 5 MG PO TABS: 5.0000 mg | ORAL_TABLET | ORAL | 0 refills | Status: DC | PRN

## 2023-11-24 MED ORDER — ONDANSETRON HCL 4 MG/2ML IJ SOLN
INTRAMUSCULAR | Status: DC | PRN
  Administered 2023-11-24: 4 mg via INTRAVENOUS

## 2023-11-24 MED ORDER — ROCURONIUM BROMIDE 10 MG/ML (PF) SYRINGE
PREFILLED_SYRINGE | INTRAVENOUS | Status: DC | PRN
  Administered 2023-11-24: 20 mg via INTRAVENOUS
  Administered 2023-11-24: 60 mg via INTRAVENOUS

## 2023-11-24 SURGICAL SUPPLY — 67 items
ANCHOR FBRTK 2.6 SUTURETAP 1.3 (Anchor) IMPLANT
ANCHOR SUT 1.8 FIBERTAK SB KL (Anchor) IMPLANT
ANCHOR SWIVELOCK BIO 4.75X19.1 (Anchor) IMPLANT
BAG COUNTER SPONGE SURGICOUNT (BAG) ×1 IMPLANT
BLADE EXCALIBUR 4.0X13 (MISCELLANEOUS) ×1 IMPLANT
BLADE SURG 11 STRL SS (BLADE) ×1 IMPLANT
BLADE SURG 15 STRL SS SAFETY (BLADE) IMPLANT
BURR OVAL 8 FLU 4.0X13 (MISCELLANEOUS) IMPLANT
COVER SURGICAL LIGHT HANDLE (MISCELLANEOUS) ×1 IMPLANT
DRAPE INCISE IOBAN 66X45 STRL (DRAPES) ×2 IMPLANT
DRAPE STERI 35X30 U-POUCH (DRAPES) ×1 IMPLANT
DRAPE U-SHAPE 47X51 STRL (DRAPES) ×2 IMPLANT
DRSG TEGADERM 4X4.5 CHG (GAUZE/BANDAGES/DRESSINGS) IMPLANT
DRSG TEGADERM 4X4.75 (GAUZE/BANDAGES/DRESSINGS) ×4 IMPLANT
DRSG XEROFORM 1X8 (GAUZE/BANDAGES/DRESSINGS) IMPLANT
DURAPREP 26ML APPLICATOR (WOUND CARE) ×1 IMPLANT
ELECT REM PT RETURN 9FT ADLT (ELECTROSURGICAL) ×1
ELECTRODE REM PT RTRN 9FT ADLT (ELECTROSURGICAL) ×1 IMPLANT
FILTER STRAW FLUID ASPIR (MISCELLANEOUS) ×1 IMPLANT
GAUZE PAD ABD 8X10 STRL (GAUZE/BANDAGES/DRESSINGS) ×3 IMPLANT
GAUZE SPONGE 4X4 12PLY STRL LF (GAUZE/BANDAGES/DRESSINGS) ×1 IMPLANT
GAUZE XEROFORM 1X8 LF (GAUZE/BANDAGES/DRESSINGS) ×1 IMPLANT
GLOVE BIOGEL PI IND STRL 8 (GLOVE) ×1 IMPLANT
GLOVE ECLIPSE 8.0 STRL XLNG CF (GLOVE) ×1 IMPLANT
GOWN STRL REUS W/ TWL LRG LVL3 (GOWN DISPOSABLE) ×3 IMPLANT
KIT BASIN OR (CUSTOM PROCEDURE TRAY) ×1 IMPLANT
KIT STR SPEAR 1.8 FBRTK DISP (KITS) IMPLANT
KIT TURNOVER KIT B (KITS) ×1 IMPLANT
MANIFOLD NEPTUNE II (INSTRUMENTS) ×1 IMPLANT
NDL HD SCORPION MEGA LOADER (NEEDLE) IMPLANT
NDL HYPO 25X1 1.5 SAFETY (NEEDLE) ×1 IMPLANT
NDL SCORPION MULTI FIRE (NEEDLE) IMPLANT
NDL SPNL 18GX3.5 QUINCKE PK (NEEDLE) ×1 IMPLANT
NDL SUT 6 .5 CRC .975X.05 MAYO (NEEDLE) IMPLANT
NEEDLE HYPO 25X1 1.5 SAFETY (NEEDLE) ×1 IMPLANT
NEEDLE SCORPION MULTI FIRE (NEEDLE) IMPLANT
NEEDLE SPNL 18GX3.5 QUINCKE PK (NEEDLE) ×1 IMPLANT
NS IRRIG 1000ML POUR BTL (IV SOLUTION) ×1 IMPLANT
PACK SHOULDER (CUSTOM PROCEDURE TRAY) ×1 IMPLANT
PAD ARMBOARD 7.5X6 YLW CONV (MISCELLANEOUS) ×2 IMPLANT
PROBE APOLLO 90XL (SURGICAL WAND) ×1 IMPLANT
RESTRAINT HEAD UNIVERSAL NS (MISCELLANEOUS) ×1 IMPLANT
SLING ARM IMMOBILIZER MED (SOFTGOODS) IMPLANT
SLING ARM IMMOBILIZER XL (CAST SUPPLIES) IMPLANT
SPONGE T-LAP 4X18 ~~LOC~~+RFID (SPONGE) ×2 IMPLANT
STRIP CLOSURE SKIN 1/2X4 (GAUZE/BANDAGES/DRESSINGS) ×1 IMPLANT
SUCTION TUBE FRAZIER 10FR DISP (SUCTIONS) ×1 IMPLANT
SUT 2 FIBERLOOP 20 STRT BLUE (SUTURE) ×1
SUT ETHILON 3 0 PS 1 (SUTURE) ×1 IMPLANT
SUT FIBERWIRE #2 38 T-5 BLUE (SUTURE) ×1
SUT MNCRL AB 3-0 PS2 18 (SUTURE) ×1 IMPLANT
SUT VIC AB 0 CT1 27XBRD ANBCTR (SUTURE) ×1 IMPLANT
SUT VIC AB 1 CT1 27XBRD ANBCTR (SUTURE) ×1 IMPLANT
SUT VIC AB 2-0 CT1 TAPERPNT 27 (SUTURE) ×1 IMPLANT
SUT VICRYL 0 UR6 27IN ABS (SUTURE) IMPLANT
SUTURE 2 FIBERLOOP 20 STRT BLU (SUTURE) IMPLANT
SUTURE FIBERWR #2 38 T-5 BLUE (SUTURE) IMPLANT
SYR 20ML LL LF (SYRINGE) ×2 IMPLANT
SYR 3ML LL SCALE MARK (SYRINGE) ×1 IMPLANT
SYR TB 1ML LUER SLIP (SYRINGE) ×1 IMPLANT
SYS FBRTK BUTTON 2.6 (Anchor) ×1 IMPLANT
SYSTEM FBRTK BUTTON 2.6 (Anchor) IMPLANT
TOWEL GREEN STERILE (TOWEL DISPOSABLE) ×1 IMPLANT
TOWEL GREEN STERILE FF (TOWEL DISPOSABLE) ×1 IMPLANT
TUBING ARTHROSCOPY IRRIG 16FT (MISCELLANEOUS) ×1 IMPLANT
WAND ABLATOR APOLLO I90 (BUR) IMPLANT
WATER STERILE IRR 1000ML POUR (IV SOLUTION) ×1 IMPLANT

## 2023-11-24 NOTE — Anesthesia Procedure Notes (Signed)
Anesthesia Regional Block: Interscalene brachial plexus block   Pre-Anesthetic Checklist: , timeout performed,  Correct Patient, Correct Site, Correct Laterality,  Correct Procedure, Correct Position, site marked,  Risks and benefits discussed,  Surgical consent,  Pre-op evaluation,  At surgeon's request and post-op pain management  Laterality: Left  Prep: chloraprep       Needles:  Injection technique: Single-shot  Needle Type: Echogenic Stimulator Needle     Needle Length: 10cm  Needle Gauge: 21   Needle insertion depth: 6 cm   Additional Needles:   Procedures:,,,, ultrasound used (permanent image in chart),,   Motor weakness within 5 minutes.  Narrative:  Start time: 11/24/2023 7:27 AM End time: 11/24/2023 7:32 AM Injection made incrementally with aspirations every 5 mL.  Performed by: Personally  Anesthesiologist: Mal Amabile, MD  Additional Notes: Timeout performed. Patient sedated. Relevant anatomy ID'd using [place]. Incremental 2-58ml injection of LA with frequent aspiration. Patient tolerated procedure well.

## 2023-11-24 NOTE — Brief Op Note (Signed)
   11/24/2023  10:16 AM  PATIENT:  Lindsay Ellis  48 y.o. female  PRE-OPERATIVE DIAGNOSIS:  left shoulder rotator cuff tear, biceps tendinitis, acromioclavicular  joint arthritis  POST-OPERATIVE DIAGNOSIS:  left shoulder rotator cuff tear, biceps tendinitis, acromioclavicular joint arthritis  PROCEDURE:  Procedure(s): LEFT SHOULDER ARTHROSCOPY, DEBRIDEMENT, BICEPS TENODESIS, ARTHROSCOPIC DISTAL CLAVICLE EXCISION, MINI OPEN ROTATOR CUFF TENDON REPAIR  SURGEON:  Surgeon(s): August Saucer, Corrie Mckusick, MD  ASSISTANT: magnant pa  ANESTHESIA:   general  EBL: 25 ml    Total I/O In: 800 [I.V.:800] Out: 50 [Blood:50]  BLOOD ADMINISTERED: none  DRAINS: none   LOCAL MEDICATIONS USED:  none  SPECIMEN:  No Specimen  COUNTS:  YES  TOURNIQUET:  * No tourniquets in log *  DICTATION: .Other Dictation: Dictation Number [id number]  PLAN OF CARE: Discharge to home after PACU  PATIENT DISPOSITION:  PACU - hemodynamically stable

## 2023-11-24 NOTE — Transfer of Care (Signed)
Immediate Anesthesia Transfer of Care Note  Patient: Treana Lodhi  Procedure(s) Performed: LEFT SHOULDER ARTHROSCOPY, DEBRIDEMENT, BICEPS TENODESIS, ARTHROSCOPIC DISTAL CLAVICLE EXCISION, MINI OPEN ROTATOR CUFF TENDON REPAIR (Left)  Patient Location: PACU  Anesthesia Type:General and Regional  Level of Consciousness: alert  and drowsy  Airway & Oxygen Therapy: Patient connected to nasal cannula oxygen  Post-op Assessment: Report given to RN, Post -op Vital signs reviewed and stable, and Patient moving all extremities  Post vital signs: Reviewed and stable  Last Vitals:  Vitals Value Taken Time  BP 83/44 11/24/23 1047  Temp    Pulse 103 11/24/23 1049  Resp 24 11/24/23 1049  SpO2 93 % 11/24/23 1049  Vitals shown include unfiled device data.  Last Pain:  Vitals:   11/24/23 0617  TempSrc:   PainSc: 4       Patients Stated Pain Goal: 1 (11/24/23 0617)  Complications: No notable events documented.

## 2023-11-24 NOTE — Anesthesia Procedure Notes (Addendum)
Procedure Name: Intubation Date/Time: 11/24/2023 7:54 AM  Performed by: Ferman Hamming, RNPre-anesthesia Checklist: Patient identified, Emergency Drugs available, Suction available and Patient being monitored Patient Re-evaluated:Patient Re-evaluated prior to induction Oxygen Delivery Method: Circle System Utilized Preoxygenation: Pre-oxygenation with 100% oxygen Induction Type: IV induction Ventilation: Mask ventilation without difficulty Laryngoscope Size: Mac and 3 Grade View: Grade I Tube type: Oral Tube size: 7.0 mm Number of attempts: 1 Airway Equipment and Method: Stylet and Oral airway Placement Confirmation: ETT inserted through vocal cords under direct vision, positive ETCO2 and breath sounds checked- equal and bilateral Secured at: 21 cm Tube secured with: Tape Dental Injury: Teeth and Oropharynx as per pre-operative assessment

## 2023-11-24 NOTE — Anesthesia Postprocedure Evaluation (Signed)
Anesthesia Post Note  Patient: Lindsay Ellis  Procedure(s) Performed: LEFT SHOULDER ARTHROSCOPY, DEBRIDEMENT, BICEPS TENODESIS, ARTHROSCOPIC DISTAL CLAVICLE EXCISION, MINI OPEN ROTATOR CUFF TENDON REPAIR (Left)     Patient location during evaluation: PACU Anesthesia Type: General Level of consciousness: awake and alert and oriented Pain management: pain level controlled Vital Signs Assessment: post-procedure vital signs reviewed and stable Respiratory status: spontaneous breathing, nonlabored ventilation and respiratory function stable Cardiovascular status: blood pressure returned to baseline and stable Postop Assessment: no apparent nausea or vomiting Anesthetic complications: no   No notable events documented.  Last Vitals:  Vitals:   11/24/23 1100 11/24/23 1115  BP: 132/76 114/61  Pulse: 92 80  Resp: 18 (!) 21  Temp:    SpO2: 95% 90%    Last Pain:  Vitals:   11/24/23 1100  TempSrc:   PainSc: 0-No pain                 Tomio Kirk A.

## 2023-11-24 NOTE — Op Note (Signed)
Lindsay Ellis, CHAIRES MEDICAL RECORD NO: [id number] ACCOUNT NO: [id number] DATE OF BIRTH: 02-Mar-1976 FACILITY: [facility] LOCATION: [facility] PHYSICIAN: Graylin Shiver. August Saucer, MD  Operative Report   DATE OF PROCEDURE: 11/24/2023  PREOPERATIVE DIAGNOSES: 1.  Left shoulder rotator cuff tear. 2.  Biceps tendinitis. 3.  AC joint arthritis.  POSTOPERATIVE DIAGNOSES: 1.  Left shoulder rotator cuff tear. 2.  Biceps tendinitis. 3.  AC joint arthritis.  PROCEDURE:  Left shoulder diagnostic and operative arthroscopy with limited debridement of the superior labrum and biceps tendon and subsequent arthroscopic distal clavicle excision and mini-open biceps tenodesis and rotator cuff tear repair of a 1 x 1  cm tear using a 1 x 2 construct Arthrex suture anchors.  SURGEON:  Graylin Shiver. August Saucer, MD  ASSISTANT:  Karenann Cai, PA.  INDICATIONS:  This is a 48 year old patient with severe left shoulder pain who presents for operative management of AC joint arthritis, rotator cuff tear, and biceps tendinitis after failure of conservative management.  DESCRIPTION OF PROCEDURE:  The patient was brought to the operating room where general anesthetic was induced.  Preoperative antibiotics administered.  Timeout was called.  The patient was placed in the beach chair position with the head in neutral  position.  Left shoulder, arm, and hand was then prescrubbed with alcohol and Betadine, which was allowed to air dry and then prepped with DuraPrep solution and draped in a sterile manner.  Collier Flowers was used to seal the operative field and cover the axilla.   After calling timeout, posterior portal was created 2 cm medial and inferior to the posterolateral margin of the acromion.  Diagnostic arthroscopy was performed.  Anterior portal was created in line with the distal end of the clavicle for later distal  clavicle excision.  The glenohumeral joint was inspected.  No significant arthritis in the glenohumeral joint.  Anterior,  inferior, and posterior inferior glenohumeral ligaments were intact.  Significant biceps tendinitis was present.  Degenerative SLAP  tear was also present.  Rotator cuff tear was also visualized at the leading edge of the supraspinatus.  Using the Arthrocare wand through the anterior portal, the superior labrum was debrided from the 10 o'clock to 2 o'clock position and the biceps  tendon was released.  The scope was then placed into the subacromial space.  Lateral portal was created.  At this time, the distal 9 mm of the clavicle was resected.  Bone was very hard.  Superior and posterior ligaments were preserved.  At this time,  after resecting the distal end of the clavicle, the instruments were removed from the portals, which were closed using 3-0 nylon suture.  Collier Flowers was then used to cover the entire operative field.  An incision was made off the anterolateral margin of the  acromion.  Skin and subcutaneous tissue was sharply divided.  The raphe between the anterior and middle deltoid was split.  A measured distance of 4 cm marked with a #1 Vicryl suture.  Bursectomy was completed.  The biceps tendon was then tenodesed into  the bicipital groove using two Arthrex knotless suture anchors and oversewn with 3-0 Vicryl sutures under the appropriate tension.  Acromioplasty performed.  Distal clavicle excision palpated and found to be good.  Rotator cuff tear was visualized, 1 x 1  cm tear.  The edges were freshened up with a 15 blade in terms of rotator cuff debridement.  Next, we placed one knotless SutureTape anchor at the junction of the articular surface in the greater tuberosity.  The four  SutureTapes were passed.  Two  margin convergence sutures were passed, which were 0 Vicryl sutures.  Two more rotator cuff edge grasping sutures of 0 Vicryl were then placed after tying those margin conversion sutures.  With the rotator cuff reduced, the SutureTapes were tied and  crossed.  They were then matched with a  Vicryl suture and Arthrex suture.  These were then placed into two SwiveLocks and the knotless additional suture from the SwiveLock was then used to reinforce the repair.  All in all, a watertight repair was  achieved, which was very stable.  Arm was taken through range of motion.  SwiveLocks were tightened with the arm slightly abducted.  Thorough irrigation was then performed.  Deltoid split was closed using #1 Vicryl suture followed by interrupted inverted  0 Vicryl suture, 2-0 Vicryl sutures and 3-0 Monocryl with Steri-Strips and impervious dressings applied.  Shoulder sling applied.  The patient tolerated the procedure well without immediate complications.  Transferred to the recovery room in stable  condition.  Luke's assistance was required for opening and closing, mobilization of tissue.  His assistance was of medical necessity.   PUS D: 11/24/2023 10:23:00 am T: 11/24/2023 10:40:00 am  JOB: [id number]/ [id number]

## 2023-11-24 NOTE — H&P (Signed)
Lindsay Ellis is an 48 y.o. female.   Chief Complaint: left shoulder pain HPI: Lindsay Ellis is a 48 y.o. female who presents to the office reporting left shoulder pain.  Since she was last seen she has had an MRI scan.  She had an injection which helped her for 2 days.  She is a stay-at-home mother.  She describes both pain and weakness in that left shoulder.  Pain does not radiate below the elbow.   MRI scan does show full-thickness rotator cuff tear with minimal retraction as well as high-grade degenerative changes at the [facility] joint with edema on both sides of the joint as well as osteophytes..   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   Sickle cell trait (HCC)    Sleep apnea    pt states she lost 100 lbs and no longer needs a CPAP   Tuberculosis    positive test TB, treated in 2003   Vertigo     Past Surgical History:  Procedure Laterality Date   CESAREAN SECTION     HAND SURGERY Right    trigger finger   LUMBAR DISC SURGERY  2011   UMBILICAL HERNIA REPAIR N/A 07/20/2022   Procedure: OPEN UMBILICAL HERNIA REPAIR WITH MESH;  Surgeon: Stechschulte, Hyman Hopes, MD;  Location: [facility];  Service: General;  Laterality: N/A;    Family History  Problem Relation Age of Onset   Lung cancer Mother    Social History:  reports that she has been smoking cigarettes. She has never used smokeless tobacco. She reports that she does not currently use alcohol. She reports that she does not use drugs.  Allergies:  Allergies  Allergen Reactions   Morphine And Codeine Hives and Itching    Medications Prior to Admission  Medication Sig Dispense Refill   albuterol (ACCUNEB) 1.25 MG/3ML nebulizer solution Take 3 mLs (1.25 mg total) by nebulization every 6 (six) hours as needed for wheezing. (Patient  taking differently: Take 1 ampule by nebulization daily.) 75 mL 3   albuterol (VENTOLIN HFA) 108 (90 Base) MCG/ACT inhaler Inhale 2 puffs into the lungs every 6 (six) hours as needed for wheezing or shortness of breath. 18 g 6   amitriptyline (ELAVIL) 100 MG tablet Take 1 tablet (100 mg total) by mouth at bedtime. 90 tablet 3   atorvastatin (LIPITOR) 20 MG tablet Take 1 tablet (20 mg total) by mouth daily. 90 tablet 1   baclofen (LIORESAL) 10 MG tablet Take 3 tablets (30 mg total) by mouth 3 (three) times daily. Do not take with cyclobenzaprine. MAX DOSE. 90 tablet 11   Budeson-Glycopyrrol-Formoterol (BREZTRI AEROSPHERE) 160-9-4.8 MCG/ACT AERO Inhale 2 puffs into the lungs 2 (two) times daily. 10.7 g 11   budesonide (PULMICORT) 0.25 MG/2ML nebulizer solution Take 2 mLs (0.25 mg total) by nebulization daily. Use with Yupelri and Ipratropium in place of daily inhaler for flairs. (Patient taking differently: Take 0.25 mg by nebulization daily as needed. Use with Yupelri and Ipratropium in place of daily inhaler for flairs.) 60 mL 12   cyclobenzaprine (FLEXERIL) 10 MG tablet Take 10 mg by mouth daily.     gabapentin (NEURONTIN) 300 MG capsule Take 5 capsules (1,500 mg total) by mouth 3 (three) times daily. 1350 capsule 1   hydrochlorothiazide (HYDRODIURIL) 12.5  MG tablet Take 1 tablet (12.5 mg total) by mouth daily. 90 tablet 1   JARDIANCE 25 MG TABS tablet Take 1 tablet (25 mg total) by mouth daily. 90 tablet 1   meclizine (ANTIVERT) 25 MG tablet Take 1 tablet (25 mg total) by mouth 3 (three) times daily. 270 tablet 1   meloxicam (MOBIC) 15 MG tablet Take 1 tablet (15 mg total) by mouth daily. 90 tablet 3   metFORMIN (GLUCOPHAGE-XR) 500 MG 24 hr tablet Take 2 tablets (1,000 mg total) by mouth 2 (two) times daily. 360 tablet 1   mometasone (ELOCON) 0.1 % cream Apply a thin layer to the fingers with rash before bed for 1 week. Repeat as necessary for rash recurrence. 45 g 1   montelukast (SINGULAIR) 10 MG  tablet Take 1 tablet (10 mg total) by mouth at bedtime. 90 tablet 1   Multiple Vitamin (MULTIVITAMIN) tablet Take 1 tablet by mouth daily.     RABEprazole (ACIPHEX) 20 MG tablet Take 1 tablet (20 mg total) by mouth daily. 90 tablet 1   revefenacin (YUPELRI) 175 MCG/3ML nebulizer solution Take 3 mLs (175 mcg total) by nebulization daily. Use with ipratropium and budesonide. (Patient taking differently: Take 175 mcg by nebulization daily as needed (Wheezing). Use with ipratropium and budesonide.) 270 mL 1   SYMBICORT 160-4.5 MCG/ACT inhaler Inhale 2 puffs into the lungs 2 (two) times daily.  6   telmisartan (MICARDIS) 40 MG tablet Take 1 tablet (40 mg total) by mouth daily. 90 tablet 1   tirzepatide (MOUNJARO) 5 MG/0.5ML Pen Inject 5 mg into the skin once a week. 6 mL 1   Vitamin D, Ergocalciferol, (DRISDOL) 1.25 MG (50000 UNIT) CAPS capsule Take 1 capsule (50,000 Units total) by mouth every 7 (seven) days. 12 capsule 3   Accu-Chek Softclix Lancets lancets Use as instructed 200 each 1   Blood Glucose Monitoring Suppl (ACCU-CHEK GUIDE) w/Device KIT 1 Device by Does not apply route daily. Accuchek guide kit 1 kit 0   Blood Glucose Monitoring Suppl (TRUE METRIX AIR GLUCOSE METER) DEVI 1 Device by Does not apply route daily. 1 each 0   Blood Glucose Monitoring Suppl DEVI 1 each by Does not apply route in the morning, at noon, and at bedtime. May substitute to any manufacturer covered by patient's insurance. 1 each 0   glucose blood (ACCU-CHEK GUIDE TEST) test strip 1 each by Other route daily. 200 strip 1   ipratropium (ATROVENT) 0.02 % nebulizer solution Take 2.5 mLs (0.5 mg total) by nebulization every 6 (six) hours as needed for wheezing or shortness of breath. Use once daily with budesonide and Yupelri in place of Daily inhaler during flairs. 75 mL 12   Lancet Device MISC For monitoring blood sugar up to 4 times a day. May substitute to any manufacturer covered by patient's insurance. 200 each 99    Lancets Misc. MISC For monitoring blood sugar up to 4 times a day. May substitute to any manufacturer covered by patient's insurance. 200 each 99    Results for orders placed or performed during the hospital encounter of 11/24/23 (from the past 48 hours)  Glucose, capillary     Status: Abnormal   Collection Time: 11/24/23  5:54 AM  Result Value Ref Range   Glucose-Capillary 116 (H) 70 - 99 mg/dL    Comment: Glucose reference range applies only to samples taken after fasting for at least 8 hours.   No results found.  Review of Systems  Musculoskeletal:  Positive for arthralgias.  All other systems reviewed and are negative.   Blood pressure 128/79, pulse 86, temperature 97.7 F (36.5 C), temperature source Oral, resp. rate 18, height 5\' 3"  (1.6 m), weight 86.2 kg, last menstrual period 11/02/2015, SpO2 98%. Physical Exam Vitals reviewed.  HENT:     Head: Normocephalic.     Nose: Nose normal.     Mouth/Throat:     Mouth: Mucous membranes are moist.  Eyes:     Pupils: Pupils are equal, round, and reactive to light.  Cardiovascular:     Rate and Rhythm: Normal rate.     Pulses: Normal pulses.  Pulmonary:     Effort: Pulmonary effort is normal.  Abdominal:     General: Abdomen is flat.  Musculoskeletal:     Cervical back: Normal range of motion.  Skin:    General: Skin is warm.     Capillary Refill: Capillary refill takes less than 2 seconds.  Neurological:     General: No focal deficit present.     Mental Status: She is alert.  Psychiatric:        Mood and Affect: Mood normal.    Ortho exam demonstrates tenderness at the [facility] joint on the left. Does not have restricted range of motion on the left versus right and external rotation. Deltoid fires. Pretty reasonable strength to subscap testing bilaterally. Has 5 out of 5 external rotation strength but painful on the left not painful on the right. Mild tenderness of the biceps tendon in the bicipital groove with positive  O'Brien's testing on the left negative on the right   Assessment/Plan Impression is symptomatic left shoulder rotator cuff tear biceps tendinosis by MRI scan and exam as well as AC joint arthritis.  Plan is left shoulder arthroscopy with debridement biceps tenodesis arthroscopic distal clavicle excision along with mini open rotator cuff tear repair.  Risk benefits are discussed with the patient including not limited to infection or vessel damage incomplete pain relief as well as the extensive nature of the rehabilitative process.  I think we could get her out of the sling at 2 weeks based on the small to medium size of the tear.  Functional recovery in the 6 to 12-week range.  Patient understands the risk and benefits and the importance of getting the shoulder moving.  Would like to use a postop brace for her to facilitate range of motion.  Risk and benefits and rehab all discussed and the patient understands and wishes to proceed.  All questions answered    Burnard Bunting, MD 11/24/2023, 7:15 AM

## 2023-11-25 ENCOUNTER — Encounter (HOSPITAL_COMMUNITY): Payer: Self-pay | Admitting: Orthopedic Surgery

## 2023-11-25 ENCOUNTER — Telehealth: Payer: Self-pay | Admitting: Orthopedic Surgery

## 2023-11-25 ENCOUNTER — Telehealth: Payer: Self-pay | Admitting: Nurse Practitioner

## 2023-11-25 NOTE — Telephone Encounter (Signed)
Pt left message needs P.A. for her Gabapentin (I remember she has a quantity limits exception)

## 2023-11-25 NOTE — Telephone Encounter (Signed)
Pt needs a po f/u within a week. Surgery date: 11/24/23

## 2023-11-25 NOTE — Telephone Encounter (Signed)
Talked with patient and appointment has been scheduled.

## 2023-11-28 ENCOUNTER — Other Ambulatory Visit (HOSPITAL_COMMUNITY): Payer: Self-pay

## 2023-11-28 ENCOUNTER — Telehealth: Payer: Self-pay

## 2023-11-28 NOTE — Telephone Encounter (Signed)
Marland Kitchen

## 2023-11-30 ENCOUNTER — Other Ambulatory Visit: Payer: Self-pay | Admitting: Nurse Practitioner

## 2023-11-30 DIAGNOSIS — E1121 Type 2 diabetes mellitus with diabetic nephropathy: Secondary | ICD-10-CM

## 2023-11-30 DIAGNOSIS — E782 Mixed hyperlipidemia: Secondary | ICD-10-CM

## 2023-11-30 DIAGNOSIS — I10 Essential (primary) hypertension: Secondary | ICD-10-CM

## 2023-11-30 DIAGNOSIS — E1165 Type 2 diabetes mellitus with hyperglycemia: Secondary | ICD-10-CM

## 2023-11-30 DIAGNOSIS — K2100 Gastro-esophageal reflux disease with esophagitis, without bleeding: Secondary | ICD-10-CM

## 2023-12-02 ENCOUNTER — Encounter: Payer: Self-pay | Admitting: Orthopedic Surgery

## 2023-12-02 ENCOUNTER — Ambulatory Visit (INDEPENDENT_AMBULATORY_CARE_PROVIDER_SITE_OTHER): Payer: 59 | Admitting: Orthopedic Surgery

## 2023-12-02 DIAGNOSIS — M75122 Complete rotator cuff tear or rupture of left shoulder, not specified as traumatic: Secondary | ICD-10-CM

## 2023-12-02 DIAGNOSIS — M19012 Primary osteoarthritis, left shoulder: Secondary | ICD-10-CM

## 2023-12-02 NOTE — Progress Notes (Unsigned)
Post-Op Visit Note   Patient: Lindsay Ellis           Date of Birth: 1976-10-06           MRN: [id number] Visit Date: 12/02/2023 PCP: Tollie Eth, NP   Assessment & Plan:  Chief Complaint:  Chief Complaint  Patient presents with   Left Shoulder - Routine Post Op    11/24/23 left shoulder scope; deb; BT; DCE; MORCR   Visit Diagnoses:  1. Arthritis of left acromioclavicular joint   2. Nontraumatic complete tear of left rotator cuff     Plan: Lindsay Ellis is a patient who is now a week out left shoulder arthroscopy debridement biceps tenodesis distal clavicle excision and mini open rotator cuff tear repair.  Patient states she is doing well.  Sutures removed.  She has got pretty good passive range of motion of the shoulder which is not particularly tender.  External rotation is to about 20 degrees and passive forward flexion abduction around 70 to 80 degrees.  I would like for her to discontinue the sling in 2 weeks and start physical therapy in 2 weeks.  Continue with passive range of motion in the brace at this time.  No lifting with left arm but overall her pain is less than I would have expected with the extensive nature of her procedure.  Follow-Up Instructions: No follow-ups on file.   Orders:  No orders of the defined types were placed in this encounter.  No orders of the defined types were placed in this encounter.   Imaging: No results found.  PMFS History: Patient Active Problem List   Diagnosis Date Noted   Dyshidrotic eczema 09/02/2023   Vitamin D deficiency 09/02/2023   Encounter for annual physical exam 09/02/2023   Tear of left rotator cuff 09/02/2023   Asthma 08/05/2022   Lumbar radiculopathy 08/05/2022   Morbid obesity (HCC) 08/05/2022   Acquired trigger finger 05/26/2022   Diabetic renal disease (HCC) 05/18/2022   Essential hypertension 05/18/2022   Gastroesophageal reflux disease with esophagitis without hemorrhage 05/18/2022   Hyperglycemia due to type  2 diabetes mellitus (HCC) 05/18/2022   Hyperlipidemia associated with type 2 diabetes mellitus (HCC) 05/18/2022   Complex regional pain syndrome I, unspecified 05/18/2022   Primary osteoarthritis 05/18/2022   Sickle cell trait (HCC) 05/18/2022   Poor compliance with CPAP treatment 10/02/2020   Tachycardia-bradycardia (HCC) 10/20/2018   Obstructive sleep apnea treated with continuous positive airway pressure (CPAP) 06/06/2018   Sleep walking and eating 04/04/2018   Irregular menses 09/17/2014   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   Sickle cell trait (HCC)    Sleep apnea    pt states she lost 100 lbs and no longer needs a CPAP   Tuberculosis    positive test TB, treated in 2003   Vertigo     Family History  Problem Relation Age of Onset   Lung cancer Mother     Past Surgical History:  Procedure Laterality Date   CESAREAN SECTION     HAND SURGERY Right    trigger finger   LUMBAR DISC SURGERY  2011   SHOULDER ARTHROSCOPY WITH OPEN ROTATOR CUFF REPAIR AND DISTAL CLAVICLE ACROMINECTOMY Left 11/24/2023   Procedure: LEFT SHOULDER ARTHROSCOPY, DEBRIDEMENT, BICEPS TENODESIS, ARTHROSCOPIC DISTAL  CLAVICLE EXCISION, MINI OPEN ROTATOR CUFF TENDON REPAIR;  Surgeon: Cammy Copa, MD;  Location: [facility] OR;  Service: Orthopedics;  Laterality: Left;   UMBILICAL HERNIA REPAIR N/A 07/20/2022   Procedure: OPEN UMBILICAL HERNIA REPAIR WITH MESH;  Surgeon: Stechschulte, Hyman Hopes, MD;  Location: [facility];  Service: General;  Laterality: N/A;   Social History   Occupational History   Not on file  Tobacco Use   Smoking status: Every Day    Current packs/day: 0.25    Types: Cigarettes   Smokeless tobacco: Never  Vaping Use   Vaping status: Never Used  Substance and Sexual Activity   Alcohol  use: Not Currently   Drug use: Never   Sexual activity: Not on file

## 2023-12-03 DIAGNOSIS — M19012 Primary osteoarthritis, left shoulder: Secondary | ICD-10-CM

## 2023-12-03 DIAGNOSIS — S43432A Superior glenoid labrum lesion of left shoulder, initial encounter: Secondary | ICD-10-CM

## 2023-12-03 DIAGNOSIS — M7522 Bicipital tendinitis, left shoulder: Secondary | ICD-10-CM

## 2023-12-03 DIAGNOSIS — M65912 Unspecified synovitis and tenosynovitis, left shoulder: Secondary | ICD-10-CM

## 2023-12-05 ENCOUNTER — Emergency Department (HOSPITAL_COMMUNITY)
Admission: EM | Admit: 2023-12-05 | Discharge: 2023-12-05 | Disposition: A | Discharge status: Home / Self Care | Payer: 59 | Attending: Emergency Medicine | Admitting: Emergency Medicine

## 2023-12-05 ENCOUNTER — Other Ambulatory Visit: Payer: Self-pay

## 2023-12-05 ENCOUNTER — Ambulatory Visit: Payer: 59 | Admitting: Family Medicine

## 2023-12-05 ENCOUNTER — Encounter (HOSPITAL_COMMUNITY): Payer: Self-pay

## 2023-12-05 DIAGNOSIS — L509 Urticaria, unspecified: Secondary | ICD-10-CM | POA: Insufficient documentation

## 2023-12-05 DIAGNOSIS — M75122 Complete rotator cuff tear or rupture of left shoulder, not specified as traumatic: Secondary | ICD-10-CM

## 2023-12-05 MED ORDER — LORATADINE 10 MG PO TABS
10.0000 mg | ORAL_TABLET | Freq: Once | ORAL | Status: AC
  Administered 2023-12-05: 10 mg via ORAL
  Filled 2023-12-05: qty 1

## 2023-12-05 MED ORDER — EPINEPHRINE 0.3 MG/0.3ML IJ SOAJ: 0.3000 mg | INTRAMUSCULAR | 0 refills | Status: AC | PRN

## 2023-12-05 MED ORDER — LORATADINE 10 MG PO TABS: 10.0000 mg | ORAL_TABLET | Freq: Every day | ORAL | 0 refills | Status: DC

## 2023-12-05 MED ORDER — PREDNISONE 50 MG PO TABS: 50.0000 mg | ORAL_TABLET | Freq: Every day | ORAL | 0 refills | Status: DC

## 2023-12-05 MED ORDER — PREDNISONE 20 MG PO TABS
60.0000 mg | ORAL_TABLET | Freq: Once | ORAL | Status: AC
  Administered 2023-12-05: 60 mg via ORAL
  Filled 2023-12-05: qty 3

## 2023-12-05 NOTE — ED Notes (Signed)
Pt requesting to speak to EDP prior to discharge.

## 2023-12-05 NOTE — ED Triage Notes (Addendum)
Pt has hives on lower back entire anterior torso, right arm, upper legs bilat. Pt is scratching the hives. Pt denies any new products or new meds. Pt states she took benadryl 50 mg at 0930 today.

## 2023-12-05 NOTE — Discharge Instructions (Addendum)
You were seen in the emergency room for hives These are most likely due to an allergic reaction of some kind but it is unclear what Your symptoms improved after Claritin and prednisone here We called in prednisone and Claritin for you to pick up from your pharmacy Take prednisone as directed for the next 3 days and Claritin as directed for itching and rash We also called an EpiPen for you to have in the event of a severe allergic reaction Remember that if you use the EpiPen you need to call 911 and come to the emergency department immediately Return to the Emergency Department for any trouble breathing, wheezing or severe allergic reaction symptoms

## 2023-12-05 NOTE — ED Provider Notes (Signed)
[place] EMERGENCY DEPARTMENT AT [facility] Provider Note   CSN: [id number] Arrival date & time: 12/05/23  1109     History  Chief Complaint  Patient presents with   Allergic Reaction    Lindsay Ellis is a 48 y.o. female.  With a history of allergy to morphine who presents for urticarial rash.  She first experienced hives over her right chest last night which have increased since the onset.  The rashes extremely pruritic now extending over her abdomen right lower back lower extremities and upper extremities.  History only of allergies to morphine and codeine but no other medications.  No new medications or antibiotics.  No new detergents lotions or foods.  No one in her house has had the same thing.  She had some nausea earlier.  No vomiting or shortness of breath or wheezing.  Took 50 mg Benadryl at home around 0930 which did not help with the itching   Allergic Reaction      Home Medications Prior to Admission medications   Medication Sig Start Date End Date Taking? Authorizing Provider  EPINEPHrine 0.3 mg/0.3 mL IJ SOAJ injection Inject 0.3 mg into the muscle as needed for anaphylaxis. 12/05/23  Yes Royanne Foots, DO  loratadine (CLARITIN) 10 MG tablet Take 1 tablet (10 mg total) by mouth daily. 12/05/23  Yes Royanne Foots, DO  predniSONE (DELTASONE) 50 MG tablet Take 1 tablet (50 mg total) by mouth daily with breakfast. 12/05/23  Yes Royanne Foots, DO  Accu-Chek Softclix Lancets lancets Use as instructed 11/21/23   Early, Sung Amabile, NP  albuterol (ACCUNEB) 1.25 MG/3ML nebulizer solution Take 3 mLs (1.25 mg total) by nebulization every 6 (six) hours as needed for wheezing. Patient taking differently: Take 1 ampule by nebulization daily. 09/06/23   Tollie Eth, NP  albuterol (VENTOLIN HFA) 108 (90 Base) MCG/ACT inhaler Inhale 2 puffs into the lungs every 6 (six) hours as needed for wheezing or shortness of breath. 09/02/23   Tollie Eth, NP  amitriptyline (ELAVIL) 100  MG tablet Take 1 tablet (100 mg total) by mouth at bedtime. 01/04/23   Tollie Eth, NP  atorvastatin (LIPITOR) 20 MG tablet Take 1 tablet (20 mg total) by mouth daily. 11/10/23   Tollie Eth, NP  baclofen (LIORESAL) 10 MG tablet Take 3 tablets (30 mg total) by mouth 3 (three) times daily. Do not take with cyclobenzaprine. MAX DOSE. 11/16/23   Early, Sung Amabile, NP  Blood Glucose Monitoring Suppl (ACCU-CHEK GUIDE) w/Device KIT 1 Device by Does not apply route daily. Accuchek guide kit 11/21/23   Early, Sung Amabile, NP  Blood Glucose Monitoring Suppl (TRUE METRIX AIR GLUCOSE METER) DEVI 1 Device by Does not apply route daily. 11/14/23   Tollie Eth, NP  Blood Glucose Monitoring Suppl DEVI 1 each by Does not apply route in the morning, at noon, and at bedtime. May substitute to any manufacturer covered by patient's insurance. 09/02/23   Tollie Eth, NP  Budeson-Glycopyrrol-Formoterol (BREZTRI AEROSPHERE) 160-9-4.8 MCG/ACT AERO Inhale 2 puffs into the lungs 2 (two) times daily. 09/02/23   Tollie Eth, NP  budesonide (PULMICORT) 0.25 MG/2ML nebulizer solution Take 2 mLs (0.25 mg total) by nebulization daily. Use with Yupelri and Ipratropium in place of daily inhaler for flairs. Patient taking differently: Take 0.25 mg by nebulization daily as needed. Use with Yupelri and Ipratropium in place of daily inhaler for flairs. 04/07/23   Tollie Eth, NP  cyclobenzaprine (  FLEXERIL) 10 MG tablet Take 10 mg by mouth daily.    [provider]  gabapentin (NEURONTIN) 300 MG capsule Take 5 capsules (1,500 mg total) by mouth 3 (three) times daily. 11/10/23   Tollie Eth, NP  glucose blood (ACCU-CHEK GUIDE TEST) test strip 1 each by Other route daily. 11/21/23   Tollie Eth, NP  hydrochlorothiazide (HYDRODIURIL) 12.5 MG tablet Take 1 tablet (12.5 mg total) by mouth daily. 11/10/23   Tollie Eth, NP  ipratropium (ATROVENT) 0.02 % nebulizer solution Take 2.5 mLs (0.5 mg total) by nebulization every 6 (six) hours as needed  for wheezing or shortness of breath. Use once daily with budesonide and Yupelri in place of Daily inhaler during flairs. 04/07/23   Tollie Eth, NP  JARDIANCE 25 MG TABS tablet Take 1 tablet (25 mg total) by mouth daily. 11/10/23   Tollie Eth, NP  Lancet Device MISC For monitoring blood sugar up to 4 times a day. May substitute to any manufacturer covered by patient's insurance. 09/02/23   Tollie Eth, NP  Lancets Misc. MISC For monitoring blood sugar up to 4 times a day. May substitute to any manufacturer covered by patient's insurance. 09/02/23   Tollie Eth, NP  meclizine (ANTIVERT) 25 MG tablet Take 1 tablet (25 mg total) by mouth 3 (three) times daily. 11/10/23   Tollie Eth, NP  meloxicam (MOBIC) 15 MG tablet Take 1 tablet (15 mg total) by mouth daily. 01/04/23   Tollie Eth, NP  metFORMIN (GLUCOPHAGE-XR) 500 MG 24 hr tablet Take 2 tablets (1,000 mg total) by mouth 2 (two) times daily. 11/10/23   Early, Sung Amabile, NP  mometasone (ELOCON) 0.1 % cream Apply a thin layer to the fingers with rash before bed for 1 week. Repeat as necessary for rash recurrence. 09/02/23   Tollie Eth, NP  montelukast (SINGULAIR) 10 MG tablet Take 1 tablet (10 mg total) by mouth at bedtime. 11/10/23   Tollie Eth, NP  Multiple Vitamin (MULTIVITAMIN) tablet Take 1 tablet by mouth daily.    [provider]  oxyCODONE (ROXICODONE) 5 MG immediate release tablet Take 1 tablet (5 mg total) by mouth every 4 (four) hours as needed for severe pain (pain score 7-10). 11/24/23   Magnant, Charles L, PA-C  RABEprazole (ACIPHEX) 20 MG tablet Take 1 tablet (20 mg total) by mouth daily. 11/10/23   Tollie Eth, NP  revefenacin (YUPELRI) 175 MCG/3ML nebulizer solution Take 3 mLs (175 mcg total) by nebulization daily. Use with ipratropium and budesonide. Patient taking differently: Take 175 mcg by nebulization daily as needed (Wheezing). Use with ipratropium and budesonide. 08/31/23   Tollie Eth, NP  SYMBICORT 160-4.5 MCG/ACT  inhaler Inhale 2 puffs into the lungs 2 (two) times daily. 02/27/18   [provider]  telmisartan (MICARDIS) 40 MG tablet Take 1 tablet (40 mg total) by mouth daily. 11/10/23   Tollie Eth, NP  tirzepatide [employer]) 5 MG/0.5ML Pen Inject 5 mg into the skin once a week. 08/10/23   Tollie Eth, NP  Vitamin D, Ergocalciferol, (DRISDOL) 1.25 MG (50000 UNIT) CAPS capsule Take 1 capsule (50,000 Units total) by mouth every 7 (seven) days. 07/13/23   Tollie Eth, NP      Allergies    Morphine and codeine    Review of Systems   Review of Systems  Physical Exam Updated Vital Signs BP (!) 146/102   Pulse (!) 103   Temp  98.6 F (37 C) (Oral)   Resp 18   Ht 5\' 3"  (1.6 m)   Wt 86.2 kg   LMP 11/02/2015   SpO2 100%   BMI 33.66 kg/m  Physical Exam Vitals and nursing note reviewed.  HENT:     Head: Normocephalic and atraumatic.     Mouth/Throat:     Comments: Patent airway with no swelling Eyes:     Pupils: Pupils are equal, round, and reactive to light.  Cardiovascular:     Rate and Rhythm: Normal rate and regular rhythm.  Pulmonary:     Effort: Pulmonary effort is normal.     Breath sounds: Normal breath sounds. No wheezing.  Abdominal:     Palpations: Abdomen is soft.     Tenderness: There is no abdominal tenderness.  Skin:    General: Skin is warm and dry.     Comments: Urticarial rash extending over right chest abdomen bilateral lower extremities and right lower back with excoriations throughout  Neurological:     Mental Status: She is alert.  Psychiatric:        Mood and Affect: Mood normal.     ED Results / Procedures / Treatments   Labs (all labs ordered are listed, but only abnormal results are displayed) Labs Reviewed - No data to display  EKG None  Radiology No results found.  Procedures Procedures    Medications Ordered in ED Medications  predniSONE (DELTASONE) tablet 60 mg (60 mg Oral Given 12/05/23 1424)  loratadine (CLARITIN) tablet 10 mg  (10 mg Oral Given 12/05/23 1424)    ED Course/ Medical Decision Making/ A&P Clinical Course as of 12/05/23 1514  Mon Dec 05, 2023  1512 Patient reports symptomatic relief after prednisone and loratadine here.  Will prescribe the 2 medications along with EpiPen for her to pick up from pharmacy.  Return precautions are reviewed worrisome for severe allergic reaction or anaphylaxis were discussed with her and her husband in detail and she knows what to come back for.  Stable for discharge at this time [MP]    Clinical Course User Index [MP] Royanne Foots, DO                                 Medical Decision Making 48 year old female with history as above presenting for suspected allergic reaction to unknown trigger.  Urticarial rash.  Some nausea.  No other system involvement.  Stable from an airway perspective.  Not requiring epi at this time.  Unclear cause of urticarial rash.  Will provide her with second generation antihistamine and dose of prednisone here and continue to monitor  Risk OTC drugs. Prescription drug management.           Final Clinical Impression(s) / ED Diagnoses Final diagnoses:  Urticaria    Rx / DC Orders ED Discharge Orders          Ordered    loratadine (CLARITIN) 10 MG tablet  Daily        12/05/23 1514    predniSONE (DELTASONE) 50 MG tablet  Daily with breakfast        12/05/23 1514    EPINEPHrine 0.3 mg/0.3 mL IJ SOAJ injection  As needed        12/05/23 1514              Royanne Foots, DO 12/05/23 1515

## 2023-12-05 NOTE — ED Notes (Signed)
Pt in no acute distress at time of discharge. Pt alert and oriented x4.

## 2023-12-06 ENCOUNTER — Encounter: Payer: Self-pay | Admitting: Medical

## 2023-12-06 ENCOUNTER — Ambulatory Visit (INDEPENDENT_AMBULATORY_CARE_PROVIDER_SITE_OTHER): Payer: 59 | Admitting: Medical

## 2023-12-06 VITALS — BP 120/72 | HR 104 | Temp 97.8°F | Ht 63.0 in | Wt 186.6 lb

## 2023-12-06 DIAGNOSIS — T7840XD Allergy, unspecified, subsequent encounter: Secondary | ICD-10-CM

## 2023-12-06 DIAGNOSIS — L299 Pruritus, unspecified: Secondary | ICD-10-CM | POA: Diagnosis not present

## 2023-12-06 DIAGNOSIS — L509 Urticaria, unspecified: Secondary | ICD-10-CM | POA: Diagnosis not present

## 2023-12-06 MED ORDER — METHYLPREDNISOLONE ACETATE 80 MG/ML IJ SUSP
80.0000 mg | Freq: Once | INTRAMUSCULAR | Status: AC
  Administered 2023-12-06: 80 mg via INTRAMUSCULAR

## 2023-12-06 MED ORDER — HYDROXYZINE PAMOATE 25 MG PO CAPS: 25.0000 mg | ORAL_CAPSULE | Freq: Three times a day (TID) | ORAL | 0 refills | Status: DC | PRN

## 2023-12-06 NOTE — Progress Notes (Signed)
 Subjective:  Lindsay Ellis is a 48 y.o. female who presents for Chief Complaint  Patient presents with   Allergic Reaction    Thinks she is having an allergic reaction to resin. Works with this procduct. Epoxy resin. Gloves keep breaking and she has open skin on her hands and entered this way.      Here for allergic reaction.  She was seen by the emergency department yesterday.  She was doing some work at home making some products for herself and she was using epoxy resin.  She had a cut on her right hand small but she thinks there may have anxiety and the cut and is causing allergic reaction.  She has not had a reaction like this in the past.  She has rash and itching all over including arms, chest, abdomen, low back, buttocks, genital area.  When she went to the emergency department they sent her home with prednisone  and Claritin  but today the rash is worse  She does not recall any other new triggers other than the resin that she thinks may have caused this.  She denies any trouble breathing, no lip or facial swelling or tongue swelling.  She has not had any other new exposures that she thinks would have triggered a rash.  No other aggravating or relieving factors.    No other c/o.  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 18 g 6   amitriptyline  (ELAVIL ) 100 MG tablet Take 1 tablet  (100 mg total) by mouth at bedtime. 90 tablet 3   atorvastatin  (LIPITOR) 20 MG tablet Take 1 tablet (20 mg total) by mouth daily. 90 tablet 1   baclofen  (LIORESAL ) 10 MG tablet Take 3 tablets (30 mg total) by mouth 3 (three) times daily. Do not take with cyclobenzaprine . MAX DOSE. 90 tablet 11   Blood Glucose Monitoring Suppl (ACCU-CHEK GUIDE) w/Device KIT 1 Device by Does not apply route daily. Accuchek guide kit 1 kit 0   Blood Glucose Monitoring Suppl (TRUE METRIX AIR GLUCOSE METER) DEVI 1 Device by Does not apply route daily. 1 each 0   Blood Glucose Monitoring Suppl DEVI 1 each by Does not apply route in the morning, at noon, and at bedtime. May substitute to any manufacturer covered by patient's insurance. 1 each 0   Budeson-Glycopyrrol-Formoterol (BREZTRI  AEROSPHERE) 160-9-4.8 MCG/ACT AERO Inhale 2 puffs into the lungs 2 (two) times daily. 10.7 g 11   budesonide  (PULMICORT ) 0.25 MG/2ML nebulizer solution Take 2 mLs (0.25 mg total) by nebulization daily. Use with Yupelri  and Ipratropium in place of daily inhaler for flairs. (Patient taking differently: Take 0.25 mg by nebulization daily as needed. Use with Yupelri  and Ipratropium in place of daily inhaler for flairs.) 60 mL 12   cyclobenzaprine  (FLEXERIL ) 10 MG tablet Take 10  mg by mouth daily.     EPINEPHrine  0.3 mg/0.3 mL IJ SOAJ injection Inject 0.3 mg into the muscle as needed for anaphylaxis. 1 each 0   gabapentin  (NEURONTIN ) 300 MG capsule Take 5 capsules (1,500 mg total) by mouth 3 (three) times daily. 1350 capsule 1   glucose blood (ACCU-CHEK GUIDE TEST) test strip 1 each by Other route daily. 200 strip 1   hydrochlorothiazide  (HYDRODIURIL ) 12.5 MG tablet Take 1 tablet (12.5 mg total) by mouth daily. 90 tablet 1   JARDIANCE  25 MG TABS tablet Take 1 tablet (25 mg total) by mouth daily. 90 tablet 1   Lancet Device MISC For monitoring blood sugar up to 4 times a day. May substitute to any manufacturer covered by patient's insurance. 200 each  99   Lancets Misc. MISC For monitoring blood sugar up to 4 times a day. May substitute to any manufacturer covered by patient's insurance. 200 each 99   meclizine  (ANTIVERT ) 25 MG tablet Take 1 tablet (25 mg total) by mouth 3 (three) times daily. 270 tablet 1   meloxicam  (MOBIC ) 15 MG tablet Take 1 tablet (15 mg total) by mouth daily. 90 tablet 3   metFORMIN  (GLUCOPHAGE -XR) 500 MG 24 hr tablet Take 2 tablets (1,000 mg total) by mouth 2 (two) times daily. 360 tablet 1   mometasone  (ELOCON ) 0.1 % cream Apply a thin layer to the fingers with rash before bed for 1 week. Repeat as necessary for rash recurrence. 45 g 1   montelukast  (SINGULAIR ) 10 MG tablet Take 1 tablet (10 mg total) by mouth at bedtime. 90 tablet 1   Multiple Vitamin (MULTIVITAMIN) tablet Take 1 tablet by mouth daily.     RABEprazole  (ACIPHEX ) 20 MG tablet Take 1 tablet (20 mg total) by mouth daily. 90 tablet 1   telmisartan  (MICARDIS ) 40 MG tablet Take 1 tablet (40 mg total) by mouth daily. 90 tablet 1   Vitamin D , Ergocalciferol , (DRISDOL ) 1.25 MG (50000 UNIT) CAPS capsule Take 1 capsule (50,000 Units total) by mouth every 7 (seven) days. 12 capsule 3   Accu-Chek Softclix Lancets lancets Use as instructed (Patient not taking: Reported on 12/06/2023) 200 each 1   albuterol  (ACCUNEB ) 1.25 MG/3ML nebulizer solution Take 3 mLs (1.25 mg total) by nebulization every 6 (six) hours as needed for wheezing. (Patient not taking: Reported on 12/06/2023) 75 mL 3   ipratropium (ATROVENT ) 0.02 % nebulizer solution Take 2.5 mLs (0.5 mg total) by nebulization every 6 (six) hours as needed for wheezing or shortness of breath. Use once daily with budesonide  and Yupelri  in place of Daily inhaler during flairs. (Patient not taking: Reported on 12/06/2023) 75 mL 12   revefenacin  (YUPELRI ) 175 MCG/3ML nebulizer solution Take 3 mLs (175 mcg total) by nebulization daily. Use with ipratropium and budesonide . (Patient not taking: Reported on 12/06/2023) 270 mL 1    tirzepatide  (MOUNJARO ) 5 MG/0.5ML Pen Inject 5 mg into the skin once a week. (Patient not taking: Reported on 12/06/2023) 6 mL 1   No current facility-administered medications on file prior to visit.     The following portions of the patient's history were reviewed and updated as appropriate: allergies, current medications, past family history, past medical history, past social history, past surgical history and problem list.  ROS Otherwise as in subjective above    Objective: BP 120/72   Pulse (!) 104   Temp 97.8 F (36.6 C) (Tympanic)   Ht 5' 3 (1.6 m)   Wt 186 lb 9.6 oz (  84.6 kg)   LMP 11/02/2015   SpO2 97%   BMI 33.05 kg/m   General appearance: alert, no distress, well developed, well nourished Urticarial wheals lesions on right forearm and antecubital region, similar rash in the middle of the chest under the right breast and on the upper abdomen, she notes other similar rash in the GU area.  Similar urticarial rash in between the eyes.  She is scratching her neck and arms. HEENT: normocephalic, sclerae anicteric, conjunctiva pink and moist, nares patent, no discharge or erythema, pharynx normal Oral cavity: MMM, no lesions, no angioedema noted Neck: supple, no lymphadenopathy, no thyromegaly, no masses Heart: RRR, normal S1, S2, no murmurs Lungs: CTA bilaterally, no wheezes, rhonchi, or rales Pulses: 2+ radial pulses, 2+ pedal pulses, normal cap refill Ext: no edema   Assessment: Encounter Diagnoses  Name Primary?   Urticaria Yes   Itching    Allergic reaction, subsequent encounter      Plan: Given your current symptoms and exam findings, we will treat you for ongoing urticaria rash and itching  We gave you a shot of Depo-Medrol  80 mg of steroid shot today  The chart lists mometasone  Elocon  cream.  You can use this for the worst itchy areas on your arms and torso for the next few days in the morning, but do not put this on the face or the genitals.  You can use  a moisturizing lotion such as cocoa butter or Shea butter or Lubriderm or Aquaphor or similar at bedtime or alternating with the Elocon  cream.  Instead of the loratadine  Claritin  advised by the emergency department, begin Hydroxyzine  prescription 2-3 times daily for the next few days for itching.  This can also make you sleepy which will be helpful today.  Also hold off on your normal meclizine  if you take this regularly.  Hold off on the meclizine  while you are on the hydroxyzine   Continue your Singulair  allergy pill daily at bedtime  Take some cool showers over the next few days or a cool bath.  No hot showers of that will just aggravate the itching  Avoid any triggers that you think may have caused the reaction such as the resin  Use mild soap and water and wash off the skin particularly the hands noted the fingernails today in case there is any other resident still present.  Anything you touched recently that would have had the resident ulna you need to stay away from that as well  Hydrate well with water throughout the day.  Continue your other medicines as usual  If you happen to get trouble breathing, lip swelling facial swelling or tongue swelling the next few days then use the EpiPen  and go back to the emergency department or call 911  If not improved within the next 4 to 5 days, then call or recheck.  We may need to stop some of the medication short-term to try to help narrow down causative agents.  You may also end up needing to see allergist if this will not go away   Dawson Hollman was seen today for allergic reaction.  Diagnoses and all orders for this visit:  Urticaria -     methylPREDNISolone  acetate (DEPO-MEDROL ) injection 80 mg  Itching -     methylPREDNISolone  acetate (DEPO-MEDROL ) injection 80 mg  Allergic reaction, subsequent encounter -     methylPREDNISolone  acetate (DEPO-MEDROL ) injection 80 mg  Other orders -     hydrOXYzine  (VISTARIL ) 25 MG capsule; Take  1 capsule (  25 mg total) by mouth every 8 (eight) hours as needed.    Follow up: prn

## 2023-12-06 NOTE — Patient Instructions (Signed)
 Given your current symptoms and exam findings, we will treat you for ongoing urticaria rash and itching  We gave you a shot of Depo-Medrol  80 mg of steroid shot today  The chart lists mometasone  Elocon  cream.  You can use this for the worst itchy areas on your arms and torso for the next few days in the morning, but do not put this on the face or the genitals.  You can use a moisturizing lotion such as cocoa butter or Shea butter or Lubriderm or Aquaphor or similar at bedtime or alternating with the Elocon  cream.  Instead of the loratadine  Claritin  advised by the emergency department, begin Hydroxyzine  prescription 2-3 times daily for the next few days for itching.  This can also make you sleepy which will be helpful today.  Also hold off on your normal meclizine  if you take this regularly.  Hold off on the meclizine  while you are on the hydroxyzine   Continue your Singulair  allergy pill daily at bedtime  Take some cool showers over the next few days or a cool bath.  No hot showers of that will just aggravate the itching  Avoid any triggers that you think may have caused the reaction such as the resin  Use mild soap and water and wash off the skin particularly the hands noted the fingernails today in case there is any other resident still present.  Anything you touched recently that would have had the resident ulna you need to stay away from that as well  Hydrate well with water throughout the day.  Continue your other medicines as usual  If you happen to get trouble breathing, lip swelling facial swelling or tongue swelling the next few days then use the EpiPen  and go back to the emergency department or call 911  If not improved within the next 4 to 5 days, then call or recheck.  We may need to stop some of the medication short-term to try to help narrow down causative agents.  You may also end up needing to see allergist if this will not go away

## 2023-12-12 DIAGNOSIS — J45998 Other asthma: Secondary | ICD-10-CM | POA: Diagnosis not present

## 2023-12-23 ENCOUNTER — Encounter: Payer: Self-pay | Admitting: Orthopedic Surgery

## 2023-12-23 ENCOUNTER — Ambulatory Visit: Payer: 59 | Admitting: Orthopedic Surgery

## 2023-12-23 DIAGNOSIS — M75122 Complete rotator cuff tear or rupture of left shoulder, not specified as traumatic: Secondary | ICD-10-CM

## 2023-12-23 NOTE — Progress Notes (Signed)
Post-Op Visit Note   Patient: Lindsay Ellis           Date of Birth: 30-Nov-1975           MRN: [id number] Visit Date: 12/23/2023 PCP: Tollie Eth, NP   Assessment & Plan:  Chief Complaint:  Chief Complaint  Patient presents with   Left Shoulder - Routine Post Op       11/24/23 left shoulder scope; deb; BT; DCE; [facility]      Visit Diagnoses:  1. Nontraumatic complete tear of left rotator cuff     Plan: Lindsay Ellis is a patient is now about a month out left shoulder arthroscopy debridement distal clavicle excision biceps tenodesis and mini open rotator cuff tear repair.  Overall she is doing well.  Has not done physical therapy.  Cannot take ibuprofen because she is taking meloxicam.  On examination range of motion is 70/100/160.  Rotator cuff strength is actually pretty good.  She should continue what she is doing but I cautioned her to avoid lifting out in front of her body.  Follow-up in 4 weeks with Lindsay Ellis.  Discontinue sling.  No lifting out away from her body.  Follow-Up Instructions: Return in about 4 weeks (around 01/20/2024).   Orders:  No orders of the defined types were placed in this encounter.  No orders of the defined types were placed in this encounter.   Imaging: No results found.  PMFS History: Patient Active Problem List   Diagnosis Date Noted   Biceps tendonitis on left 12/03/2023   Degenerative superior labral anterior-to-posterior (SLAP) tear of left shoulder 12/03/2023   Arthritis of left acromioclavicular joint 12/03/2023   Synovitis of left shoulder 12/03/2023   Dyshidrotic eczema 09/02/2023   Vitamin D deficiency 09/02/2023   Encounter for annual physical exam 09/02/2023   Tear of left rotator cuff 09/02/2023   Asthma 08/05/2022   Lumbar radiculopathy 08/05/2022   Morbid obesity (HCC) 08/05/2022   Acquired trigger finger 05/26/2022   Diabetic renal disease (HCC) 05/18/2022   Essential hypertension 05/18/2022   Gastroesophageal reflux disease with  esophagitis without hemorrhage 05/18/2022   Hyperglycemia due to type 2 diabetes mellitus (HCC) 05/18/2022   Hyperlipidemia associated with type 2 diabetes mellitus (HCC) 05/18/2022   Complex regional pain syndrome I, unspecified 05/18/2022   Primary osteoarthritis 05/18/2022   Sickle cell trait (HCC) 05/18/2022   Poor compliance with CPAP treatment 10/02/2020   Tachycardia-bradycardia (HCC) 10/20/2018   Obstructive sleep apnea treated with continuous positive airway pressure (CPAP) 06/06/2018   Sleep walking and eating 04/04/2018   Irregular menses 09/17/2014   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   Sickle cell trait (HCC)    Sleep apnea    pt states she lost 100 lbs and no longer needs a CPAP   Tuberculosis    positive test TB, treated in 2003   Vertigo     Family History  Problem Relation Age of Onset   Lung cancer Mother     Past Surgical History:  Procedure Laterality Date   CESAREAN SECTION     HAND SURGERY Right    trigger finger   LUMBAR DISC SURGERY  2011   SHOULDER ARTHROSCOPY WITH OPEN ROTATOR CUFF REPAIR AND DISTAL CLAVICLE ACROMINECTOMY Left 11/24/2023   Procedure:  LEFT SHOULDER ARTHROSCOPY, DEBRIDEMENT, BICEPS TENODESIS, ARTHROSCOPIC DISTAL CLAVICLE EXCISION, MINI OPEN ROTATOR CUFF TENDON REPAIR;  Surgeon: Lindsay Copa, MD;  Location: [facility] OR;  Service: Orthopedics;  Laterality: Left;   UMBILICAL HERNIA REPAIR N/A 07/20/2022   Procedure: OPEN UMBILICAL HERNIA REPAIR WITH MESH;  Surgeon: Lindsay Ellis, Lindsay Hopes, MD;  Location: [facility];  Service: General;  Laterality: N/A;   Social History   Occupational History   Not on file  Tobacco Use   Smoking status: Every Day    Current packs/day: 0.25    Types: Cigarettes   Smokeless tobacco: Never  Vaping Use    Vaping status: Never Used  Substance and Sexual Activity   Alcohol use: Not Currently   Drug use: Never   Sexual activity: Not on file

## 2023-12-30 ENCOUNTER — Other Ambulatory Visit: Payer: Self-pay | Admitting: Nurse Practitioner

## 2023-12-30 ENCOUNTER — Other Ambulatory Visit: Payer: Self-pay

## 2023-12-30 DIAGNOSIS — Z0000 Encounter for general adult medical examination without abnormal findings: Secondary | ICD-10-CM

## 2023-12-30 DIAGNOSIS — J4551 Severe persistent asthma with (acute) exacerbation: Secondary | ICD-10-CM

## 2023-12-30 DIAGNOSIS — E1121 Type 2 diabetes mellitus with diabetic nephropathy: Secondary | ICD-10-CM

## 2023-12-30 DIAGNOSIS — E782 Mixed hyperlipidemia: Secondary | ICD-10-CM

## 2023-12-30 DIAGNOSIS — I10 Essential (primary) hypertension: Secondary | ICD-10-CM

## 2023-12-30 DIAGNOSIS — E1165 Type 2 diabetes mellitus with hyperglycemia: Secondary | ICD-10-CM

## 2023-12-30 DIAGNOSIS — E559 Vitamin D deficiency, unspecified: Secondary | ICD-10-CM

## 2023-12-30 DIAGNOSIS — J4541 Moderate persistent asthma with (acute) exacerbation: Secondary | ICD-10-CM

## 2023-12-30 DIAGNOSIS — K2100 Gastro-esophageal reflux disease with esophagitis, without bleeding: Secondary | ICD-10-CM

## 2023-12-30 DIAGNOSIS — B9689 Other specified bacterial agents as the cause of diseases classified elsewhere: Secondary | ICD-10-CM

## 2023-12-30 MED ORDER — MELOXICAM 15 MG PO TABS: 15.0000 mg | ORAL_TABLET | Freq: Every day | ORAL | 1 refills | Status: DC

## 2023-12-30 MED ORDER — ALBUTEROL SULFATE 1.25 MG/3ML IN NEBU
1.0000 | INHALATION_SOLUTION | Freq: Four times a day (QID) | RESPIRATORY_TRACT | 3 refills | Status: DC | PRN
Start: 2023-12-30 — End: 2024-08-28

## 2023-12-30 MED ORDER — VITAMIN D (ERGOCALCIFEROL) 1.25 MG (50000 UNIT) PO CAPS: 50000.0000 [IU] | ORAL_CAPSULE | ORAL | 1 refills | Status: DC

## 2023-12-30 MED ORDER — BREZTRI AEROSPHERE 160-9-4.8 MCG/ACT IN AERO: 2.0000 | INHALATION_SPRAY | Freq: Two times a day (BID) | RESPIRATORY_TRACT | 1 refills | Status: DC

## 2023-12-30 MED ORDER — MOUNJARO 5 MG/0.5ML ~~LOC~~ SOAJ: 5.0000 mg | SUBCUTANEOUS | 1 refills | Status: DC

## 2023-12-30 MED ORDER — BUDESONIDE 0.25 MG/2ML IN SUSP
0.2500 mg | Freq: Every day | RESPIRATORY_TRACT | 1 refills | Status: DC
Start: 2023-12-30 — End: 2024-04-23

## 2023-12-30 MED ORDER — ALBUTEROL SULFATE HFA 108 (90 BASE) MCG/ACT IN AERS: 2.0000 | INHALATION_SPRAY | Freq: Four times a day (QID) | RESPIRATORY_TRACT | 6 refills | Status: DC | PRN

## 2023-12-30 NOTE — Telephone Encounter (Signed)
 Copied from CRM [id number]. Topic: Clinical - Medication Refill >> Dec 30, 2023 10:36 AM Antwanette L wrote: Most Recent Primary Care Visit:  Provider: Jac Canavan  Department: [facility]  Visit Type: ACUTE 30  Date: 12/06/2023  Medication: Vitamin D, Ergocalciferol, (DRISDOL) 1.25 MG (50000 UNIT) CAPS capsule, meloxicam (MOBIC) 15 MG tablet,tirzepatide (MOUNJARO) 5 MG/0.5ML Pen,albuterol (ACCUNEB) 1.25 MG/3ML nebulizer solution,albuterol (VENTOLIN HFA) 108 (90 Base) MCG/ACT inhaler,Budeson-Glycopyrrol-Formoterol (BREZTRI AEROSPHERE) 160-9-4.8 MCG/ACT AERO,budesonide (PULMICORT) 0.25 MG/2ML nebulizer solution  Has the patient contacted their pharmacy? Yes   Is this the correct pharmacy for this prescription? Yes  This is the patient's preferred pharmacy:  [facility] - [place], [place] - [address] [place] [address] Phone: [phone]  Fax: [phone]     Has the prescription been filled recently? No  Is the patient out of the medication? Yes  Has the patient been seen for an appointment in the last year OR does the patient have an upcoming appointment? Yes  Can we respond through MyChart? No.Please contact patient by phone [phone]  Agent: Please be advised that Rx refills may take up to 3 business days. We ask that you follow-up with your pharmacy.

## 2023-12-30 NOTE — Telephone Encounter (Signed)
 Last Fill: Vitamin D: 07/13/23     Meloxicam: 01/04/23     Tirzepatide: 08/10/23     Albuterol Neb Solution: 09/06/23     Albuterol Inhaler: 09/02/23     Breztri: 09/02/23     Pulmicort: 04/07/23  Last OV: 09/02/23 Next OV: 03/01/24  Routing to provider for review/authorization.

## 2024-01-05 ENCOUNTER — Other Ambulatory Visit: Payer: Self-pay | Admitting: Nurse Practitioner

## 2024-01-05 DIAGNOSIS — B9689 Other specified bacterial agents as the cause of diseases classified elsewhere: Secondary | ICD-10-CM

## 2024-01-05 DIAGNOSIS — E1165 Type 2 diabetes mellitus with hyperglycemia: Secondary | ICD-10-CM

## 2024-01-05 DIAGNOSIS — K2100 Gastro-esophageal reflux disease with esophagitis, without bleeding: Secondary | ICD-10-CM

## 2024-01-05 DIAGNOSIS — E782 Mixed hyperlipidemia: Secondary | ICD-10-CM

## 2024-01-05 DIAGNOSIS — E1121 Type 2 diabetes mellitus with diabetic nephropathy: Secondary | ICD-10-CM

## 2024-01-05 DIAGNOSIS — J4541 Moderate persistent asthma with (acute) exacerbation: Secondary | ICD-10-CM

## 2024-01-05 DIAGNOSIS — I10 Essential (primary) hypertension: Secondary | ICD-10-CM

## 2024-01-09 DIAGNOSIS — J45998 Other asthma: Secondary | ICD-10-CM | POA: Diagnosis not present

## 2024-01-11 LAB — HM DIABETES EYE EXAM

## 2024-01-19 ENCOUNTER — Other Ambulatory Visit: Payer: Self-pay | Admitting: Nurse Practitioner

## 2024-01-19 DIAGNOSIS — M5416 Radiculopathy, lumbar region: Secondary | ICD-10-CM

## 2024-01-19 NOTE — Telephone Encounter (Signed)
 Last apt 12/06/23.

## 2024-01-19 NOTE — Telephone Encounter (Signed)
 Copied from CRM [id number]. Topic: Clinical - Medication Refill >> Jan 19, 2024  9:26 AM Truddie Crumble wrote: Most Recent Primary Care Visit:  Provider: Jac Canavan  Department: [facility]  Visit Type: ACUTE 30  Date: 12/06/2023  Medication: cyclobenzaprine (FLEXERIL) 10 MG tablet and baclofen (LIORESAL) 10 MG tablet  Has the patient contacted their pharmacy? Yes (Agent: If no, request that the patient contact the pharmacy for the refill. If patient does not wish to contact the pharmacy document the reason why and proceed with request.) (Agent: If yes, when and what did the pharmacy advise?)  Is this the correct pharmacy for this prescription? Yes If no, delete pharmacy and type the correct one.  This is the patient's preferred pharmacy:  [facility] - [place], [place] - [address] [place] [address] Phone: [phone] Fax: [phone]   Has the prescription been filled recently? Yes  Is the patient out of the medication? Yes  Has the patient been seen for an appointment in the last year OR does the patient have an upcoming appointment? Yes  Can we respond through MyChart? Yes  Agent: Please be advised that Rx refills may take up to 3 business days. We ask that you follow-up with your pharmacy.

## 2024-01-23 ENCOUNTER — Other Ambulatory Visit: Payer: Self-pay | Admitting: Nurse Practitioner

## 2024-01-23 DIAGNOSIS — Z1231 Encounter for screening mammogram for malignant neoplasm of breast: Secondary | ICD-10-CM

## 2024-01-23 MED ORDER — BACLOFEN 10 MG PO TABS: 30.0000 mg | ORAL_TABLET | Freq: Three times a day (TID) | ORAL | 11 refills | Status: DC

## 2024-01-23 MED ORDER — CYCLOBENZAPRINE HCL 10 MG PO TABS: 10.0000 mg | ORAL_TABLET | Freq: Every day | ORAL | 1 refills | Status: DC

## 2024-01-25 ENCOUNTER — Encounter: Payer: Self-pay | Admitting: Orthopedic Surgery

## 2024-01-25 ENCOUNTER — Ambulatory Visit (INDEPENDENT_AMBULATORY_CARE_PROVIDER_SITE_OTHER): Payer: 59 | Admitting: Surgical

## 2024-01-25 DIAGNOSIS — Z9889 Other specified postprocedural states: Secondary | ICD-10-CM

## 2024-01-25 NOTE — Progress Notes (Signed)
 Post-Op Visit Note   Patient: Lindsay Ellis           Date of Birth: 1976/10/17           MRN: [id number] Visit Date: 01/25/2024 PCP: Tollie Eth, NP   Assessment & Plan:  Chief Complaint:  Chief Complaint  Patient presents with   Left Shoulder - Pain    Patient advising she is feeling better but does have some discomfort in front of left shoulder. No falls or injuries No numbness/Tingling-Weakness Patient is not working at this time.   Visit Diagnoses: No diagnosis found.  Plan: Patient is a 48 year old female who presents s/p left shoulder arthroscopy with bicep tenodesis, distal clavicle excision, mini open rotator cuff tear repair on 11/24/2023.  She is progressing well and feeling better compared with her last visit.  Still complains of some anterior shoulder pain primarily that is worse with overhead and behind her back.  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  She occasionally has to lift her 77-year-old grandson who weighs about 20 pounds but she tries to avoid this is much as possible and does not lift him every day.  She is sleeping better and sleeps well throughout the night.  Back to her regular medicines with only the very occasional dose of oxycodone if the pain gets bad.  On exam, patient has 35 degrees X rotation, 90 degrees abduction, 125 degrees forward elevation passively and actively.  She has incisions that are well-healed.  2+ radial pulse of the operative extremity.  Intact EPL, FPL, finger abduction, pronation/supination, bicep, tricep, deltoid.  No weakness noted with these muscle groups.  She has no Popeye deformity noted.  Excellent rotator cuff strength of supra, infra, subscap rated 5/5.  She has mild amount of crepitus noted in the area of the rotator cuff incision with passive motion at 90 degrees of abduction.  Plan is continue with at home exercises.  Recommended she add some exercises to focus on more range of motion.   She is mostly using the bionic sling and doing some exercise band workouts which has done well for her in the first few months but I think a lot of the pain she has is more related to the stiffness since she is fairly pain-free below shoulder but as she gets more toward 90 degrees of abduction and 100 degrees of forward elevation, that is where the pain tends to kick in.  She will do some more overhead range of motion exercises and follow-up with the office in 6 weeks for final check with Dr. August Saucer.  Caution against lifting overhead or lifting out away from her body especially with her grandson.  She will keep any necessary lifting she has to do close to her body.  Follow-Up Instructions: No follow-ups on file.   Orders:  No orders of the defined types were placed in this encounter.  No orders of the defined types were placed in this encounter.   Imaging: No results found.  PMFS History: Patient Active Problem List   Diagnosis Date Noted   Biceps tendonitis on left 12/03/2023   Degenerative superior labral anterior-to-posterior (SLAP) tear of left shoulder 12/03/2023   Arthritis of left acromioclavicular joint 12/03/2023   Synovitis of left shoulder 12/03/2023   Dyshidrotic eczema 09/02/2023   Vitamin D deficiency 09/02/2023   Encounter for annual physical exam 09/02/2023   Tear of left rotator cuff 09/02/2023   Asthma 08/05/2022  Lumbar radiculopathy 08/05/2022   Morbid obesity (HCC) 08/05/2022   Acquired trigger finger 05/26/2022   Diabetic renal disease (HCC) 05/18/2022   Essential hypertension 05/18/2022   Gastroesophageal reflux disease with esophagitis without hemorrhage 05/18/2022   Hyperglycemia due to type 2 diabetes mellitus (HCC) 05/18/2022   Hyperlipidemia associated with type 2 diabetes mellitus (HCC) 05/18/2022   Complex regional pain syndrome I, unspecified 05/18/2022   Primary osteoarthritis 05/18/2022   Sickle cell trait (HCC) 05/18/2022   Poor compliance with  CPAP treatment 10/02/2020   Tachycardia-bradycardia (HCC) 10/20/2018   Obstructive sleep apnea treated with continuous positive airway pressure (CPAP) 06/06/2018   Sleep walking and eating 04/04/2018   Irregular menses 09/17/2014   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   Sickle cell trait (HCC)    Sleep apnea    pt states she lost 100 lbs and no longer needs a CPAP   Tuberculosis    positive test TB, treated in 2003   Vertigo     Family History  Problem Relation Age of Onset   Lung cancer Mother     Past Surgical History:  Procedure Laterality Date   CESAREAN SECTION     HAND SURGERY Right    trigger finger   LUMBAR DISC SURGERY  2011   SHOULDER ARTHROSCOPY WITH OPEN ROTATOR CUFF REPAIR AND DISTAL CLAVICLE ACROMINECTOMY Left 11/24/2023   Procedure: LEFT SHOULDER ARTHROSCOPY, DEBRIDEMENT, BICEPS TENODESIS, ARTHROSCOPIC DISTAL CLAVICLE EXCISION, MINI OPEN ROTATOR CUFF TENDON REPAIR;  Surgeon: Cammy Copa, MD;  Location: [facility];  Service: Orthopedics;  Laterality: Left;   UMBILICAL HERNIA REPAIR N/A 07/20/2022   Procedure: OPEN UMBILICAL HERNIA REPAIR WITH MESH;  Surgeon: Stechschulte, Hyman Hopes, MD;  Location: [facility];  Service: General;  Laterality: N/A;   Social History   Occupational History   Not on file  Tobacco Use   Smoking status: Every Day    Current packs/day: 0.25    Types: Cigarettes   Smokeless tobacco: Never  Vaping Use   Vaping status: Never Used  Substance and Sexual Activity   Alcohol use: Not Currently   Drug use: Never   Sexual activity: Not on file

## 2024-01-29 ENCOUNTER — Other Ambulatory Visit: Payer: Self-pay | Admitting: Nurse Practitioner

## 2024-01-29 DIAGNOSIS — E782 Mixed hyperlipidemia: Secondary | ICD-10-CM

## 2024-01-29 DIAGNOSIS — E1121 Type 2 diabetes mellitus with diabetic nephropathy: Secondary | ICD-10-CM

## 2024-01-29 DIAGNOSIS — K2100 Gastro-esophageal reflux disease with esophagitis, without bleeding: Secondary | ICD-10-CM

## 2024-01-29 DIAGNOSIS — E1165 Type 2 diabetes mellitus with hyperglycemia: Secondary | ICD-10-CM

## 2024-01-29 DIAGNOSIS — I10 Essential (primary) hypertension: Secondary | ICD-10-CM

## 2024-01-30 NOTE — Telephone Encounter (Signed)
 09/02/23 last apt.

## 2024-02-03 ENCOUNTER — Encounter: Payer: Self-pay | Admitting: Nurse Practitioner

## 2024-02-03 ENCOUNTER — Ambulatory Visit: Admitting: Nurse Practitioner

## 2024-02-03 VITALS — BP 122/72 | HR 102 | Temp 98.6°F | Wt 175.4 lb

## 2024-02-03 DIAGNOSIS — Z9109 Other allergy status, other than to drugs and biological substances: Secondary | ICD-10-CM

## 2024-02-03 DIAGNOSIS — E1165 Type 2 diabetes mellitus with hyperglycemia: Secondary | ICD-10-CM | POA: Diagnosis not present

## 2024-02-03 DIAGNOSIS — I495 Sick sinus syndrome: Secondary | ICD-10-CM | POA: Diagnosis not present

## 2024-02-03 DIAGNOSIS — J4521 Mild intermittent asthma with (acute) exacerbation: Secondary | ICD-10-CM

## 2024-02-03 DIAGNOSIS — E1121 Type 2 diabetes mellitus with diabetic nephropathy: Secondary | ICD-10-CM | POA: Diagnosis not present

## 2024-02-03 DIAGNOSIS — J40 Bronchitis, not specified as acute or chronic: Secondary | ICD-10-CM

## 2024-02-03 DIAGNOSIS — G9059 Complex regional pain syndrome I of other specified site: Secondary | ICD-10-CM | POA: Diagnosis not present

## 2024-02-03 DIAGNOSIS — J0140 Acute pansinusitis, unspecified: Secondary | ICD-10-CM | POA: Diagnosis not present

## 2024-02-03 MED ORDER — LEVOCETIRIZINE DIHYDROCHLORIDE 5 MG PO TABS: 5.0000 mg | ORAL_TABLET | Freq: Every evening | ORAL | 11 refills | Status: DC | PRN

## 2024-02-03 MED ORDER — PROMETHAZINE-DM 6.25-15 MG/5ML PO SYRP: 5.0000 mL | ORAL_SOLUTION | Freq: Four times a day (QID) | ORAL | 0 refills | Status: DC | PRN

## 2024-02-03 MED ORDER — PREDNISONE 20 MG PO TABS: 40.0000 mg | ORAL_TABLET | Freq: Every day | ORAL | 0 refills | Status: DC

## 2024-02-03 MED ORDER — AZITHROMYCIN 250 MG PO TABS: ORAL_TABLET | ORAL | 0 refills | Status: AC

## 2024-02-03 NOTE — Progress Notes (Signed)
 Lindsay Eth, DNP, AGNP-c [facility] [address] [place], [place] [address] [phone]   ACUTE VISIT- ESTABLISHED PATIENT  Blood pressure 122/72, pulse (!) 102, temperature 98.6 F (37 C), weight 175 lb 6.4 oz (79.6 kg), last menstrual period 11/02/2015, SpO2 97%.  Subjective:  HPI Lindsay Ellis is a 48 y.o. female presents to day for evaluation of acute concern(s).   History of Present Illness Lindsay Ellis "Lindsay Ellis" is a 48 year old female who presents with cold symptoms and a persistent cough.  She has been experiencing cold symptoms for the past week, including a persistent cough, sore throat, and chest pain from coughing. No fever is present. Her throat feels itchy, and her chest hurts when she coughs. She believes her symptoms started after her granddaughter brought the illness into the house, affecting her and a baby she cares for.  She is currently using a nebulizer about three times a day, which provides temporary relief. She is also taking Singulair, Yupelri, ibuprofen, and budesonide. She is allergic to codeine and has not taken cough syrup with hydrocodone. For chronic pain, she takes cyclobenzaprine at night and baclofen three times a day, including at night.  She initially thought her symptoms were due to allergy season, as she often experiences allergies during this time. She mentions having cuts in her nose that won't heal due to frequent nose blowing.  She is planning to travel to [place] this summer for her wedding and to visit family. She has custody of her granddaughter and takes care of a baby. Her daughter has another grandson who is autistic. She drinks a lot of water and enjoys eating ice.  ROS negative except for what is listed in HPI. History, Medications, Surgery, SDOH, and Family History reviewed and updated as appropriate.  Objective:  Physical Exam Vitals and nursing note reviewed.  Constitutional:      General: She is not in  acute distress.    Appearance: She is ill-appearing. She is not diaphoretic.  HENT:     Head: Normocephalic.     Nose: Congestion present.     Mouth/Throat:     Pharynx: Posterior oropharyngeal erythema present.  Eyes:     Conjunctiva/sclera: Conjunctivae normal.  Cardiovascular:     Rate and Rhythm: Regular rhythm. Tachycardia present.     Pulses: Normal pulses.     Heart sounds: Normal heart sounds.  Pulmonary:     Effort: Pulmonary effort is normal.     Breath sounds: Wheezing present.  Musculoskeletal:     Cervical back: No tenderness.     Right lower leg: No edema.     Left lower leg: No edema.  Lymphadenopathy:     Cervical: No cervical adenopathy.  Skin:    General: Skin is warm and dry.     Capillary Refill: Capillary refill takes less than 2 seconds.  Neurological:     Mental Status: She is alert and oriented to person, place, and time.     Motor: No weakness.  Psychiatric:        Mood and Affect: Mood normal.        Behavior: Behavior normal.         Assessment & Plan:   Problem List Items Addressed This Visit     Tachycardia-bradycardia (HCC)   HR elevated in the office today with no concerning symptoms. Will continue to monitor closely. Ensure good hydration, particularly when ill.       Asthma   Asthma exacerbation likely  triggered by upper respiratory infection and allergies. Nebulizer used three times daily provides temporary relief. Prednisone prescribed to reduce inflammation and improve breathing, with awareness of potential temporary blood sugar elevation. - Prescribe prednisone to reduce inflammation and improve breathing. - Continue current asthma management with nebulizer treatments.      Relevant Medications   predniSONE (DELTASONE) 20 MG tablet   Diabetic renal disease (HCC)   No concerning symptoms present today. Suspect prednisone will increase BG levels. Will monitor closely.  - Ensure you are staying well hydrated.        Hyperglycemia due to type 2 diabetes mellitus (HCC)   Asthma exacerbation/bronchitis with need for prednisone burst. Discussed that blood sugars will increase with this medication. Recommend caution with dietary choices to ensure that she is keeping her blood sugars as under control as possible with her diet during this time.       Complex regional pain syndrome I, unspecified   Long-term use of cyclobenzaprine and baclofen for chronic pain management. Concern about sedative effects when taken together at night. Combination of medications has been taken for 10+ years and has provided relief of chronic pain. We discussed the serious effects and recommendations/limitations for prescribing. Adjust timing of baclofen to morning and afternoon, allowing cyclobenzaprine for nighttime pain relief. - Advise taking baclofen in the morning and afternoon, and cyclobenzaprine at night to avoid sedative interactions. - Monitor for changes in pain management effectiveness with adjusted medication schedule.      Relevant Medications   predniSONE (DELTASONE) 20 MG tablet   Acute non-recurrent pansinusitis - Primary   Symptoms consistent with an upper respiratory tract infection, including cough, chest pain from coughing, and sore throat, persisting for about a week. No fever present. Nasal congestion and irritation likely exacerbated by seasonal allergies, with initial allergy symptoms possibly progressing to an infection. - Prescribe azithromycin (Z-Pak) for suspected bacterial infection. - Prescribe promethazine dextromethorphan for cough, especially for nighttime use. - Prescribe levocetirizine for allergy management. - Advise use of a mask when outside to reduce allergen exposure. - Encourage increased fluid intake for hydration.      Relevant Medications   azithromycin (ZITHROMAX) 250 MG tablet   levocetirizine (XYZAL) 5 MG tablet   promethazine-dextromethorphan (PROMETHAZINE-DM) 6.25-15 MG/5ML syrup    predniSONE (DELTASONE) 20 MG tablet   Bronchitis   Relevant Medications   azithromycin (ZITHROMAX) 250 MG tablet   promethazine-dextromethorphan (PROMETHAZINE-DM) 6.25-15 MG/5ML syrup   predniSONE (DELTASONE) 20 MG tablet   Other Visit Diagnoses       Allergy to environmental factors       Relevant Medications   levocetirizine (XYZAL) 5 MG tablet   predniSONE (DELTASONE) 20 MG tablet         Lindsay Eth, DNP, AGNP-c

## 2024-02-03 NOTE — Patient Instructions (Signed)
 Rest as much as you can and be sure you are drinking plenty of water. I have sent in the antibiotic, allergy medication, cough medication, and prednisone. If you are not feeling better next week after you finish the medication, please let me know.

## 2024-02-06 ENCOUNTER — Encounter: Payer: Self-pay | Admitting: Nurse Practitioner

## 2024-02-06 DIAGNOSIS — J40 Bronchitis, not specified as acute or chronic: Secondary | ICD-10-CM | POA: Insufficient documentation

## 2024-02-06 DIAGNOSIS — J0140 Acute pansinusitis, unspecified: Secondary | ICD-10-CM | POA: Insufficient documentation

## 2024-02-06 NOTE — Assessment & Plan Note (Signed)
 HR elevated in the office today with no concerning symptoms. Will continue to monitor closely. Ensure good hydration, particularly when ill.

## 2024-02-06 NOTE — Assessment & Plan Note (Signed)
 No concerning symptoms present today. Suspect prednisone will increase BG levels. Will monitor closely.  - Ensure you are staying well hydrated.

## 2024-02-06 NOTE — Assessment & Plan Note (Signed)
 Symptoms consistent with an upper respiratory tract infection, including cough, chest pain from coughing, and sore throat, persisting for about a week. No fever present. Nasal congestion and irritation likely exacerbated by seasonal allergies, with initial allergy symptoms possibly progressing to an infection. - Prescribe azithromycin (Z-Pak) for suspected bacterial infection. - Prescribe promethazine dextromethorphan for cough, especially for nighttime use. - Prescribe levocetirizine for allergy management. - Advise use of a mask when outside to reduce allergen exposure. - Encourage increased fluid intake for hydration.

## 2024-02-06 NOTE — Assessment & Plan Note (Signed)
 Asthma exacerbation/bronchitis with need for prednisone burst. Discussed that blood sugars will increase with this medication. Recommend caution with dietary choices to ensure that she is keeping her blood sugars as under control as possible with her diet during this time.

## 2024-02-06 NOTE — Assessment & Plan Note (Signed)
>>  ASSESSMENT AND PLAN FOR ASTHMA WRITTEN ON 02/06/2024  6:43 AM BY Izell Labat E, NP  Asthma exacerbation likely triggered by upper respiratory infection and allergies. Nebulizer used three times daily provides temporary relief. Prednisone  prescribed to reduce inflammation and improve breathing, with awareness of potential temporary blood sugar elevation. - Prescribe prednisone  to reduce inflammation and improve breathing. - Continue current asthma management with nebulizer treatments.

## 2024-02-06 NOTE — Assessment & Plan Note (Signed)
 Long-term use of cyclobenzaprine and baclofen for chronic pain management. Concern about sedative effects when taken together at night. Combination of medications has been taken for 10+ years and has provided relief of chronic pain. We discussed the serious effects and recommendations/limitations for prescribing. Adjust timing of baclofen to morning and afternoon, allowing cyclobenzaprine for nighttime pain relief. - Advise taking baclofen in the morning and afternoon, and cyclobenzaprine at night to avoid sedative interactions. - Monitor for changes in pain management effectiveness with adjusted medication schedule.

## 2024-02-06 NOTE — Assessment & Plan Note (Signed)
 Asthma exacerbation likely triggered by upper respiratory infection and allergies. Nebulizer used three times daily provides temporary relief. Prednisone prescribed to reduce inflammation and improve breathing, with awareness of potential temporary blood sugar elevation. - Prescribe prednisone to reduce inflammation and improve breathing. - Continue current asthma management with nebulizer treatments.

## 2024-02-07 ENCOUNTER — Ambulatory Visit
Admission: RE | Admit: 2024-02-07 | Discharge: 2024-02-07 | Disposition: A | Discharge status: Home / Self Care | Source: Ambulatory Visit | Attending: Nurse Practitioner | Admitting: Nurse Practitioner

## 2024-02-07 DIAGNOSIS — Z1231 Encounter for screening mammogram for malignant neoplasm of breast: Secondary | ICD-10-CM | POA: Diagnosis not present

## 2024-02-09 ENCOUNTER — Ambulatory Visit: Admitting: Nurse Practitioner

## 2024-02-09 DIAGNOSIS — J45998 Other asthma: Secondary | ICD-10-CM | POA: Diagnosis not present

## 2024-02-23 ENCOUNTER — Other Ambulatory Visit: Payer: Self-pay | Admitting: Nurse Practitioner

## 2024-02-23 DIAGNOSIS — E1165 Type 2 diabetes mellitus with hyperglycemia: Secondary | ICD-10-CM

## 2024-02-23 DIAGNOSIS — I10 Essential (primary) hypertension: Secondary | ICD-10-CM

## 2024-02-23 DIAGNOSIS — K2100 Gastro-esophageal reflux disease with esophagitis, without bleeding: Secondary | ICD-10-CM

## 2024-02-23 DIAGNOSIS — E782 Mixed hyperlipidemia: Secondary | ICD-10-CM

## 2024-02-23 DIAGNOSIS — E1121 Type 2 diabetes mellitus with diabetic nephropathy: Secondary | ICD-10-CM

## 2024-02-23 DIAGNOSIS — J4551 Severe persistent asthma with (acute) exacerbation: Secondary | ICD-10-CM

## 2024-03-01 ENCOUNTER — Encounter: Payer: Self-pay | Admitting: Nurse Practitioner

## 2024-03-01 ENCOUNTER — Encounter: Payer: Medicare HMO | Admitting: Nurse Practitioner

## 2024-03-04 ENCOUNTER — Other Ambulatory Visit: Payer: Self-pay | Admitting: Nurse Practitioner

## 2024-03-04 DIAGNOSIS — E1165 Type 2 diabetes mellitus with hyperglycemia: Secondary | ICD-10-CM

## 2024-03-04 DIAGNOSIS — E782 Mixed hyperlipidemia: Secondary | ICD-10-CM

## 2024-03-04 DIAGNOSIS — I10 Essential (primary) hypertension: Secondary | ICD-10-CM

## 2024-03-04 DIAGNOSIS — E1121 Type 2 diabetes mellitus with diabetic nephropathy: Secondary | ICD-10-CM

## 2024-03-04 DIAGNOSIS — K2100 Gastro-esophageal reflux disease with esophagitis, without bleeding: Secondary | ICD-10-CM

## 2024-03-04 DIAGNOSIS — J4541 Moderate persistent asthma with (acute) exacerbation: Secondary | ICD-10-CM

## 2024-03-04 DIAGNOSIS — B9689 Other specified bacterial agents as the cause of diseases classified elsewhere: Secondary | ICD-10-CM

## 2024-03-07 ENCOUNTER — Encounter: Payer: Self-pay | Admitting: Orthopedic Surgery

## 2024-03-07 ENCOUNTER — Ambulatory Visit (INDEPENDENT_AMBULATORY_CARE_PROVIDER_SITE_OTHER): Admitting: Orthopedic Surgery

## 2024-03-07 ENCOUNTER — Other Ambulatory Visit (INDEPENDENT_AMBULATORY_CARE_PROVIDER_SITE_OTHER)

## 2024-03-07 DIAGNOSIS — M25561 Pain in right knee: Secondary | ICD-10-CM

## 2024-03-07 NOTE — Progress Notes (Signed)
 Office Visit Note   Patient: Lindsay Ellis           Date of Birth: [birth year]           MRN: [id number] Visit Date: 03/07/2024 Requested by: Annella Kief, NP [address] [place],  [place] [address] PCP: Annella Kief, NP  Subjective: Chief Complaint  Patient presents with   Left Shoulder - Follow-up    left shoulder arthroscopy with bicep tenodesis, distal clavicle excision, mini open rotator cuff tear repair on 11/24/2023   Right Knee - Pain    HPI: Lindsay Ellis is a 48 y.o. female who presents to the office reporting left shoulder pain and right lower extremity pain and weakness.  She is about 4 months out left shoulder arthroscopy with biceps tenodesis distal clavicle excision and mini open rotator cuff tear repair.  Overall she is doing well.  Using a Thera-Band.  Did have some anterior pain last visit but that is better.  She also reports some numbness and tingling going down the dorsal aspect of the right foot.  Having some lateral knee pain on the right-hand side.  Has a known history of low back pain as well with surgery being performed in 2011..                ROS: All systems reviewed are negative as they relate to the chief complaint within the history of present illness.  Patient denies fevers or chills.  Assessment & Plan: Visit Diagnoses:  1. Right knee pain, unspecified chronicity     Plan: Impression is doing well following left shoulder surgery.  Continue with strengthening and stretching exercises.  I think she has got something going on with the right leg which is either peroneal nerve compression at the knee as she does have some focal tenderness around the fibular neck or this could be referred pain from the back based on her history of having low back surgery.  Plan at this time is nerve conduction to evaluate peroneal nerve compression versus radiculopathy.  She will follow-up after that study for further evaluation and management.  Follow-Up  Instructions: No follow-ups on file.   Orders:  Orders Placed This Encounter  Procedures   XR Knee 1-2 Views Right   Ambulatory referral to Physical Medicine Rehab   No orders of the defined types were placed in this encounter.     Procedures: No procedures performed   Clinical Data: No additional findings.  Objective: Vital Signs: LMP 11/02/2015   Physical Exam:  Constitutional: Patient appears well-developed HEENT:  Head: Normocephalic Eyes:EOM are normal Neck: Normal range of motion Cardiovascular: Normal rate Pulmonary/chest: Effort normal Neurologic: Patient is alert Skin: Skin is warm Psychiatric: Patient has normal mood and affect  Ortho Exam: Ortho exam demonstrates good shoulder motion and strength above 90 degrees with ability to get her hand behind her head.  Biceps contour is normal.  Has very good rotation strength external and internal against resistance.  On that right leg exam she has full range of motion of the right knee with no nerve root tension signs.  Does have more of a prominence over the fibular head on the right compared to the left.  Has 5 out of 5 ankle dorsiflexion plantarflexion eversion strength but does have some paresthesias on the right foot.  No groin pain with internal/external Tatian of the leg.  Mild pain with FADIR lateral bending.  Specialty Comments:  No specialty comments available.  Imaging: No results  found.   PMFS History: Patient Active Problem List   Diagnosis Date Noted   Acute non-recurrent pansinusitis 02/06/2024   Bronchitis 02/06/2024   Arthritis of left acromioclavicular joint 12/03/2023   Synovitis of left shoulder 12/03/2023   Dyshidrotic eczema 09/02/2023   Vitamin D  deficiency 09/02/2023   Encounter for annual physical exam 09/02/2023   Degenerative superior labral anterior-to-posterior (SLAP) tear of left shoulder 09/02/2023   Asthma 08/05/2022   Lumbar radiculopathy 08/05/2022   Morbid obesity (HCC)  08/05/2022   Acquired trigger finger 05/26/2022   Diabetic renal disease (HCC) 05/18/2022   Essential hypertension 05/18/2022   Gastroesophageal reflux disease with esophagitis without hemorrhage 05/18/2022   Hyperglycemia due to type 2 diabetes mellitus (HCC) 05/18/2022   Hyperlipidemia associated with type 2 diabetes mellitus (HCC) 05/18/2022   Complex regional pain syndrome I, unspecified 05/18/2022   Primary osteoarthritis 05/18/2022   Sickle cell trait (HCC) 05/18/2022   Tachycardia-bradycardia (HCC) 10/20/2018   Obstructive sleep apnea treated with continuous positive airway pressure (CPAP) 06/06/2018   Sleep walking and eating 04/04/2018   Irregular menses 09/17/2014   Past Medical History:  Diagnosis Date   Asthma    Diabetes mellitus without complication (HCC)    GERD (gastroesophageal reflux disease)    Hyperlipidemia    Hypertension    Osteoarthritis    Poor compliance with CPAP treatment 10/02/2020   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   Sickle cell trait (HCC)    Sleep apnea    pt states she lost 100 lbs and no longer needs a CPAP   Tuberculosis    positive test TB, treated in 2003   Vertigo     Family History  Problem Relation Age of Onset   Lung cancer Mother    Breast cancer Neg Hx    BRCA 1/2 Neg Hx     Past Surgical History:  Procedure Laterality Date   CESAREAN SECTION     HAND SURGERY Right    trigger finger   LUMBAR DISC SURGERY  2011   SHOULDER ARTHROSCOPY WITH OPEN ROTATOR CUFF REPAIR AND DISTAL CLAVICLE ACROMINECTOMY Left 11/24/2023   Procedure: LEFT SHOULDER ARTHROSCOPY, DEBRIDEMENT, BICEPS TENODESIS, ARTHROSCOPIC DISTAL CLAVICLE EXCISION, MINI OPEN ROTATOR CUFF TENDON REPAIR;  Surgeon: Jasmine Mesi, MD;  Location: [facility] OR;  Service: Orthopedics;  Laterality: Left;   UMBILICAL HERNIA REPAIR N/A 07/20/2022   Procedure: OPEN UMBILICAL HERNIA REPAIR WITH MESH;  Surgeon: Stechschulte, Avon Boers, MD;  Location: [facility];  Service:  General;  Laterality: N/A;   Social History   Occupational History   Not on file  Tobacco Use   Smoking status: Every Day    Current packs/day: 0.25    Types: Cigarettes   Smokeless tobacco: Never  Vaping Use   Vaping status: Never Used  Substance and Sexual Activity   Alcohol use: Not Currently   Drug use: Never   Sexual activity: Not on file

## 2024-03-10 DIAGNOSIS — J45998 Other asthma: Secondary | ICD-10-CM | POA: Diagnosis not present

## 2024-03-20 ENCOUNTER — Ambulatory Visit (INDEPENDENT_AMBULATORY_CARE_PROVIDER_SITE_OTHER): Admitting: Physical Medicine and Rehabilitation

## 2024-03-20 ENCOUNTER — Ambulatory Visit (INDEPENDENT_AMBULATORY_CARE_PROVIDER_SITE_OTHER): Payer: Medicare HMO

## 2024-03-20 DIAGNOSIS — R202 Paresthesia of skin: Secondary | ICD-10-CM

## 2024-03-20 DIAGNOSIS — R531 Weakness: Secondary | ICD-10-CM

## 2024-03-20 DIAGNOSIS — M79604 Pain in right leg: Secondary | ICD-10-CM

## 2024-03-20 DIAGNOSIS — R200 Anesthesia of skin: Secondary | ICD-10-CM | POA: Diagnosis not present

## 2024-03-20 DIAGNOSIS — Z0000 Encounter for general adult medical examination without abnormal findings: Secondary | ICD-10-CM

## 2024-03-20 NOTE — Progress Notes (Signed)
 Subjective:   Lindsay Ellis is a 48 y.o. who presents for a Medicare Wellness preventive visit.  As a reminder, Annual Wellness Visits don't include a physical exam, and some assessments may be limited, especially if this visit is performed virtually. We may recommend an in-person follow-up visit with your provider if needed.  Visit Complete: Virtual I connected with  [name] on 03/20/24 by a audio enabled telemedicine application and verified that I am speaking with the correct person using two identifiers.  Patient Location: Home  Provider Location: Office/Clinic  I discussed the limitations of evaluation and management by telemedicine. The patient expressed understanding and agreed to proceed.  Vital Signs: Because this visit was a virtual/telehealth visit, some criteria may be missing or patient reported. Any vitals not documented were not able to be obtained and vitals that have been documented are patient reported.  VideoError- Librarian, academic were attempted between this provider and patient, however failed, due to patient having technical difficulties OR patient did not have access to video capability.  We continued and completed visit with audio only.   Persons Participating in Visit: Patient.  AWV Questionnaire: No: Patient Medicare AWV questionnaire was not completed prior to this visit.  Cardiac Risk Factors include: advanced age (>4men, >66 women);diabetes mellitus;dyslipidemia;hypertension     Objective:     Today's Vitals   03/20/24 1016  PainSc: 4    There is no height or weight on file to calculate BMI.     03/20/2024   10:25 AM 11/18/2023    9:20 AM 03/15/2023   10:17 AM 07/07/2022    8:00 AM 03/01/2018   11:55 PM  Advanced Directives  Does Patient Have a Medical Advance Directive? No No No No No  Would patient like information on creating a medical advance directive?  No - Patient declined  Yes (MAU/Ambulatory/Procedural  Areas - Information given) Yes (MAU/Ambulatory/Procedural Areas - Information given)    Current Medications (verified) Outpatient Encounter Medications as of 03/20/2024  Medication Sig   Accu-Chek Softclix Lancets lancets Use as instructed   albuterol  (ACCUNEB ) 1.25 MG/3ML nebulizer solution Take 3 mLs (1.25 mg total) by nebulization every 6 (six) hours as needed for wheezing.   albuterol  (VENTOLIN  HFA) 108 (90 Base) MCG/ACT inhaler USE 2 INHALATIONS BY MOUTH EVERY 6 HOURS AS NEEDED FOR WHEEZING  OR SHORTNESS OF BREATH   amitriptyline  (ELAVIL ) 100 MG tablet Take 1 tablet (100 mg total) by mouth at bedtime.   atorvastatin  (LIPITOR) 20 MG tablet Take 1 tablet (20 mg total) by mouth daily.   baclofen  (LIORESAL ) 10 MG tablet Take 3 tablets (30 mg total) by mouth 3 (three) times daily. Do not take with cyclobenzaprine . MAX DOSE.   Blood Glucose Monitoring Suppl (ACCU-CHEK GUIDE) w/Device KIT 1 Device by Does not apply route daily. Accuchek guide kit   Blood Glucose Monitoring Suppl (TRUE METRIX AIR GLUCOSE METER) DEVI 1 Device by Does not apply route daily.   Blood Glucose Monitoring Suppl DEVI 1 each by Does not apply route in the morning, at noon, and at bedtime. May substitute to any manufacturer covered by patient's insurance.   Budeson-Glycopyrrol-Formoterol (BREZTRI  AEROSPHERE) 160-9-4.8 MCG/ACT AERO Inhale 2 puffs into the lungs 2 (two) times daily.   budesonide  (PULMICORT ) 0.25 MG/2ML nebulizer solution Take 2 mLs (0.25 mg total) by nebulization daily. Use with Yupelri  and Ipratropium in place of daily inhaler for flairs.   cyclobenzaprine  (FLEXERIL ) 10 MG tablet Take 1 tablet (10 mg total) by mouth  daily.   EPINEPHrine  0.3 mg/0.3 mL IJ SOAJ injection Inject 0.3 mg into the muscle as needed for anaphylaxis.   gabapentin  (NEURONTIN ) 300 MG capsule TAKE 5 CAPSULES BY MOUTH 3 TIMES DAILY   glucose blood (ACCU-CHEK GUIDE TEST) test strip 1 each by Other route daily.   hydrochlorothiazide   (HYDRODIURIL ) 12.5 MG tablet Take 1 tablet (12.5 mg total) by mouth daily.   hydrOXYzine  (VISTARIL ) 25 MG capsule Take 1 capsule (25 mg total) by mouth every 8 (eight) hours as needed.   ipratropium (ATROVENT ) 0.02 % nebulizer solution Take 2.5 mLs (0.5 mg total) by nebulization every 6 (six) hours as needed for wheezing or shortness of breath. Use once daily with budesonide  and Yupelri  in place of Daily inhaler during flairs.   JARDIANCE  25 MG TABS tablet Take 1 tablet (25 mg total) by mouth daily.   Lancet Device MISC For monitoring blood sugar up to 4 times a day. May substitute to any manufacturer covered by patient's insurance.   Lancets Misc. MISC For monitoring blood sugar up to 4 times a day. May substitute to any manufacturer covered by patient's insurance.   levocetirizine (XYZAL ) 5 MG tablet Take 1 tablet (5 mg total) by mouth at bedtime as needed for allergies.   meclizine  (ANTIVERT ) 25 MG tablet Take 1 tablet (25 mg total) by mouth 3 (three) times daily.   meloxicam  (MOBIC ) 15 MG tablet Take 1 tablet (15 mg total) by mouth daily.   metFORMIN  (GLUCOPHAGE -XR) 500 MG 24 hr tablet Take 2 tablets (1,000 mg total) by mouth 2 (two) times daily.   mometasone  (ELOCON ) 0.1 % cream Apply a thin layer to the fingers with rash before bed for 1 week. Repeat as necessary for rash recurrence.   montelukast  (SINGULAIR ) 10 MG tablet Take 1 tablet (10 mg total) by mouth at bedtime.   Multiple Vitamin (MULTIVITAMIN) tablet Take 1 tablet by mouth daily.   predniSONE  (DELTASONE ) 20 MG tablet Take 2 tablets (40 mg total) by mouth daily with breakfast.   promethazine -dextromethorphan (PROMETHAZINE -DM) 6.25-15 MG/5ML syrup Take 5 mLs by mouth 4 (four) times daily as needed. Use for up to 10 days then stop.   RABEprazole  (ACIPHEX ) 20 MG tablet Take 1 tablet (20 mg total) by mouth daily.   revefenacin  (YUPELRI ) 175 MCG/3ML nebulizer solution Take 3 mLs (175 mcg total) by nebulization daily. Use with ipratropium  and budesonide .   telmisartan  (MICARDIS ) 40 MG tablet Take 1 tablet (40 mg total) by mouth daily.   tirzepatide  (MOUNJARO ) 5 MG/0.5ML Pen Inject 5 mg into the skin once a week.   Vitamin D , Ergocalciferol , (DRISDOL ) 1.25 MG (50000 UNIT) CAPS capsule Take 1 capsule (50,000 Units total) by mouth every 7 (seven) days.   No facility-administered encounter medications on file as of 03/20/2024.    Allergies (verified) Morphine and codeine   History: Past Medical History:  Diagnosis Date   Asthma    Diabetes mellitus without complication (HCC)    GERD (gastroesophageal reflux disease)    Hyperlipidemia    Hypertension    Osteoarthritis    Poor compliance with CPAP treatment 10/02/2020   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   Sickle cell trait (HCC)    Sleep apnea    pt states she lost 100 lbs and no longer needs a CPAP   Tuberculosis    positive test TB, treated in 2003   Vertigo    Past Surgical History:  Procedure Laterality Date   CESAREAN SECTION     HAND  SURGERY Right    trigger finger   LUMBAR DISC SURGERY  2011   SHOULDER ARTHROSCOPY WITH OPEN ROTATOR CUFF REPAIR AND DISTAL CLAVICLE ACROMINECTOMY Left 11/24/2023   Procedure: LEFT SHOULDER ARTHROSCOPY, DEBRIDEMENT, BICEPS TENODESIS, ARTHROSCOPIC DISTAL CLAVICLE EXCISION, MINI OPEN ROTATOR CUFF TENDON REPAIR;  Surgeon: Jasmine Mesi, MD;  Location: [facility] OR;  Service: Orthopedics;  Laterality: Left;   UMBILICAL HERNIA REPAIR N/A 07/20/2022   Procedure: OPEN UMBILICAL HERNIA REPAIR WITH MESH;  Surgeon: Stechschulte, Avon Boers, MD;  Location: [facility];  Service: General;  Laterality: N/A;   Family History  Problem Relation Age of Onset   Lung cancer Mother    Breast cancer Neg Hx    BRCA 1/2 Neg Hx    Social History   Socioeconomic History   Marital status: Single    Spouse name: Not on file   Number of children: 3   Years of education: Not on file   Highest education level: Not on file  Occupational History    Not on file  Tobacco Use   Smoking status: Every Day    Current packs/day: 0.25    Types: Cigarettes   Smokeless tobacco: Never  Vaping Use   Vaping status: Never Used  Substance and Sexual Activity   Alcohol use: Not Currently   Drug use: Never   Sexual activity: Not on file  Other Topics Concern   Not on file  Social History Narrative   Not on file   Social Drivers of Health   Financial Resource Strain: Low Risk  (03/20/2024)   Overall Financial Resource Strain (CARDIA)    Difficulty of Paying Living Expenses: Not hard at all  Food Insecurity: No Food Insecurity (03/20/2024)   Hunger Vital Sign    Worried About Running Out of Food in the Last Year: Never true    Ran Out of Food in the Last Year: Never true  Transportation Needs: No Transportation Needs (03/20/2024)   PRAPARE - Administrator, Civil Service (Medical): No    Lack of Transportation (Non-Medical): No  Physical Activity: Insufficiently Active (03/20/2024)   Exercise Vital Sign    Days of Exercise per Week: 1 day    Minutes of Exercise per Session: 120 min  Stress: Stress Concern Present (03/20/2024)   [employer] of Occupational Health - Occupational Stress Questionnaire    Feeling of Stress : To some extent  Social Connections: Moderately Isolated (03/20/2024)   Social Connection and Isolation Panel [NHANES]    Frequency of Communication with Friends and Family: More than three times a week    Frequency of Social Gatherings with Friends and Family: Never    Attends Religious Services: Never    Database administrator or Organizations: No    Attends Engineer, structural: Never    Marital Status: Living with partner    Tobacco Counseling Ready to quit: Yes Counseling given: Not Answered    Clinical Intake:  Pre-visit preparation completed: Yes  Pain : 0-10 Pain Score: 4  Pain Type: Chronic pain Pain Location: Generalized Pain Descriptors / Indicators: Aching Pain Onset:  More than a month ago Pain Frequency: Constant     Nutritional Risks: Nausea/ vomitting/ diarrhea (vomiting) Diabetes: Yes CBG done?: No Did pt. bring in CBG monitor from home?: No  Lab Results  Component Value Date   HGBA1C 6.2 (H) 11/18/2023   HGBA1C 6.1 (H) 09/02/2023   HGBA1C 5.8 (H) 01/04/2023     How often do you  need to have someone help you when you read instructions, pamphlets, or other written materials from your doctor or pharmacy?: 1 - Never  Interpreter Needed?: No  Information entered by :: NAllen LPN   Activities of Daily Living     03/20/2024   10:18 AM 11/18/2023    9:25 AM  In your present state of health, do you have any difficulty performing the following activities:  Hearing? 0   Vision? 0   Difficulty concentrating or making decisions? 0   Walking or climbing stairs? 1   Dressing or bathing? 0   Doing errands, shopping? 0 0  Preparing Food and eating ? N   Using the Toilet? N   In the past six months, have you accidently leaked urine? N   Do you have problems with loss of bowel control? N   Managing your Medications? N   Managing your Finances? N   Housekeeping or managing your Housekeeping? N     Patient Care Team: Early, Sara E, NP as PCP - General (Nurse Practitioner)  Indicate any recent Medical Services you may have received from other than [facility] providers in the past year (date may be approximate).     Assessment:    This is a routine wellness examination for [place].  Hearing/Vision screen Hearing Screening - Comments:: Denies hearing issues Vision Screening - Comments:: Regular eye exams,    Goals Addressed             This Visit's Progress    Patient Stated       03/20/2024, wants to lose weight       Depression Screen     03/20/2024   10:27 AM 09/02/2023    8:15 AM 03/15/2023   10:18 AM 01/04/2023    2:48 PM 04/25/2018    2:18 PM 03/01/2018   11:55 PM  PHQ 2/9 Scores  PHQ - 2 Score 0 0 0 0 0 0  PHQ- 9 Score 1  1        Fall Risk     03/20/2024   10:26 AM 09/02/2023    8:15 AM 03/15/2023   10:18 AM 01/04/2023    2:48 PM 04/25/2018    2:18 PM  Fall Risk   Falls in the past year? 1 0 0 0 No  Comment leg went out, fall out the bed      Number falls in past yr: 1 0 0 0   Injury with Fall? 0 0 0 0   Risk for fall due to : Impaired balance/gait;Impaired mobility;Medication side effect No Fall Risks Medication side effect No Fall Risks   Follow up Falls prevention discussed;Falls evaluation completed Falls evaluation completed Falls prevention discussed;Education provided;Falls evaluation completed Falls evaluation completed     MEDICARE RISK AT HOME:  Medicare Risk at Home Any stairs in or around the home?: Yes If so, are there any without handrails?: No Home free of loose throw rugs in walkways, pet beds, electrical cords, etc?: Yes Adequate lighting in your home to reduce risk of falls?: Yes Life alert?: No Use of a cane, walker or w/c?: No Grab bars in the bathroom?: No Shower chair or bench in shower?: No Elevated toilet seat or a handicapped toilet?: No  TIMED UP AND GO:  Was the test performed?  No  Cognitive Function: 6CIT completed        03/20/2024   10:28 AM 03/15/2023   10:20 AM  6CIT Screen  What Year? 0 points 0  points  What month? 0 points 0 points  What time? 0 points 0 points  Count back from 20 0 points 0 points  Months in reverse 0 points 0 points  Repeat phrase 0 points 2 points  Total Score 0 points 2 points    Immunizations Immunization History  Administered Date(s) Administered   Influenza, Seasonal, Injecte, Preservative Fre 09/02/2023   Influenza,inj,Quad PF,6+ Mos 11/09/2012, 11/12/2013, 09/16/2014, 12/09/2015, 09/15/2016   Influenza-Unspecified 10/07/2011   PFIZER(Purple Top)SARS-COV-2 Vaccination 03/15/2020, 04/06/2020   Pfizer(Comirnaty)Fall Seasonal Vaccine 12 years and older 09/02/2023   Pneumococcal Polysaccharide-23 02/03/2015    Pneumococcal-Unspecified 06/17/2008   Td 08/26/2005   Tdap 11/12/2013    Screening Tests Health Maintenance  Topic Date Due   OPHTHALMOLOGY EXAM  Never done   Pneumococcal Vaccine 56-85 Years old (2 of 2 - PCV) 02/03/2016   DTaP/Tdap/Td (3 - Td or Tdap) 11/13/2023   COVID-19 Vaccine (4 - Pfizer risk 2024-25 season) 03/01/2024   HEMOGLOBIN A1C  05/17/2024   INFLUENZA VACCINE  06/01/2024   Diabetic kidney evaluation - Urine ACR  09/01/2024   FOOT EXAM  09/01/2024   Diabetic kidney evaluation - eGFR measurement  11/17/2024   Medicare Annual Wellness (AWV)  03/20/2025   Cervical Cancer Screening (HPV/Pap Cotest)  01/22/2026   Colonoscopy  12/02/2031   Hepatitis C Screening  Completed   HIV Screening  Completed   HPV VACCINES  Aged Out   Meningococcal B Vaccine  Aged Out    Health Maintenance  Health Maintenance Due  Topic Date Due   OPHTHALMOLOGY EXAM  Never done   Pneumococcal Vaccine 33-86 Years old (2 of 2 - PCV) 02/03/2016   DTaP/Tdap/Td (3 - Td or Tdap) 11/13/2023   COVID-19 Vaccine (4 - Pfizer risk 2024-25 season) 03/01/2024   Health Maintenance Items Addressed: Due for TDAP and pneumonia vaccine. States insurance is not covering DM eye exams  Additional Screening:  Vision Screening: Recommended annual ophthalmology exams for early detection of glaucoma and other disorders of the eye.  Dental Screening: Recommended annual dental exams for proper oral hygiene  Community Resource Referral / Chronic Care Management: CRR required this visit?  No   CCM required this visit?  No   Plan:    I have personally reviewed and noted the following in the patient's chart:   Medical and social history Use of alcohol, tobacco or illicit drugs  Current medications and supplements including opioid prescriptions. Patient is not currently taking opioid prescriptions. Functional ability and status Nutritional status Physical activity Advanced directives List of other  physicians Hospitalizations, surgeries, and ER visits in previous 12 months Vitals Screenings to include cognitive, depression, and falls Referrals and appointments  In addition, I have reviewed and discussed with patient certain preventive protocols, quality metrics, and best practice recommendations. A written personalized care plan for preventive services as well as general preventive health recommendations were provided to patient.   Areatha Beecham, LPN   1/61/0960   After Visit Summary: (MyChart) Due to this being a telephonic visit, the after visit summary with patients personalized plan was offered to patient via MyChart   Notes: Nothing significant to report at this time.

## 2024-03-20 NOTE — Patient Instructions (Signed)
 Lindsay Ellis , Thank you for taking time out of your busy schedule to complete your Annual Wellness Visit with me. I enjoyed our conversation and look forward to speaking with you again next year. I, as well as your care team,  appreciate your ongoing commitment to your health goals. Please review the following plan we discussed and let me know if I can assist you in the future. Your Game plan/ To Do List    Referrals: If you haven't heard from the office you've been referred to, please reach out to them at the phone provided.  N/a Follow up Visits: Next Medicare AWV with our clinical staff: 04/02/2025 at 10:10   Have you seen your provider in the last 6 months (3 months if uncontrolled diabetes)? Yes Next Office Visit with your provider: 06/12/2024 at 9:30  Clinician Recommendations:  Aim for 30 minutes of exercise or brisk walking, 6-8 glasses of water, and 5 servings of fruits and vegetables each day. Don't forget to get TDAP at the pharmacy.      This is a list of the screening recommended for you and due dates:  Health Maintenance  Topic Date Due   Eye exam for diabetics  Never done   Pneumococcal Vaccination (2 of 2 - PCV) 02/03/2016   DTaP/Tdap/Td vaccine (3 - Td or Tdap) 11/13/2023   COVID-19 Vaccine (4 - Pfizer risk 2024-25 season) 03/01/2024   Hemoglobin A1C  05/17/2024   Flu Shot  06/01/2024   Yearly kidney health urinalysis for diabetes  09/01/2024   Complete foot exam   09/01/2024   Yearly kidney function blood test for diabetes  11/17/2024   Medicare Annual Wellness Visit  03/20/2025   Pap with HPV screening  01/22/2026   Colon Cancer Screening  12/02/2031   Hepatitis C Screening  Completed   HIV Screening  Completed   HPV Vaccine  Aged Out   Meningitis B Vaccine  Aged Out    Advanced directives: (ACP Link)Information on Advanced Care Planning can be found at [employer] Advance Health Care Directives Advance Health Care Directives. [email or web address]  Advance  Care Planning is important because it:  [x]  Makes sure you receive the medical care that is consistent with your values, goals, and preferences  [x]  It provides guidance to your family and loved ones and reduces their decisional burden about whether or not they are making the right decisions based on your wishes.  Follow the link provided in your after visit summary or read over the paperwork we have mailed to you to help you started getting your Advance Directives in place. If you need assistance in completing these, please reach out to us  so that we can help you!  See attachments for Preventive Care and Fall Prevention Tips.

## 2024-03-20 NOTE — Progress Notes (Unsigned)
 Pain Scale   Average Pain 4  She is having pain, numbness and tingling in right leg from knee to toes. She has been falling.

## 2024-03-21 ENCOUNTER — Ambulatory Visit (INDEPENDENT_AMBULATORY_CARE_PROVIDER_SITE_OTHER): Admitting: Medical

## 2024-03-21 ENCOUNTER — Encounter: Payer: Self-pay | Admitting: Medical

## 2024-03-21 ENCOUNTER — Other Ambulatory Visit: Payer: Self-pay | Admitting: Nurse Practitioner

## 2024-03-21 VITALS — BP 122/86 | HR 82 | Temp 96.4°F | Wt 180.2 lb

## 2024-03-21 DIAGNOSIS — J4541 Moderate persistent asthma with (acute) exacerbation: Secondary | ICD-10-CM

## 2024-03-21 DIAGNOSIS — F17200 Nicotine dependence, unspecified, uncomplicated: Secondary | ICD-10-CM | POA: Diagnosis not present

## 2024-03-21 DIAGNOSIS — I10 Essential (primary) hypertension: Secondary | ICD-10-CM

## 2024-03-21 DIAGNOSIS — K2100 Gastro-esophageal reflux disease with esophagitis, without bleeding: Secondary | ICD-10-CM

## 2024-03-21 DIAGNOSIS — E782 Mixed hyperlipidemia: Secondary | ICD-10-CM

## 2024-03-21 DIAGNOSIS — Z79899 Other long term (current) drug therapy: Secondary | ICD-10-CM | POA: Diagnosis not present

## 2024-03-21 DIAGNOSIS — R1111 Vomiting without nausea: Secondary | ICD-10-CM

## 2024-03-21 DIAGNOSIS — E1165 Type 2 diabetes mellitus with hyperglycemia: Secondary | ICD-10-CM

## 2024-03-21 DIAGNOSIS — R059 Cough, unspecified: Secondary | ICD-10-CM | POA: Diagnosis not present

## 2024-03-21 DIAGNOSIS — R29898 Other symptoms and signs involving the musculoskeletal system: Secondary | ICD-10-CM

## 2024-03-21 DIAGNOSIS — B9689 Other specified bacterial agents as the cause of diseases classified elsewhere: Secondary | ICD-10-CM

## 2024-03-21 DIAGNOSIS — R109 Unspecified abdominal pain: Secondary | ICD-10-CM

## 2024-03-21 DIAGNOSIS — E1121 Type 2 diabetes mellitus with diabetic nephropathy: Secondary | ICD-10-CM

## 2024-03-21 NOTE — Progress Notes (Signed)
 Subjective:  Lindsay Ellis is a 48 y.o. female who presents for Chief Complaint  Patient presents with   Acute Visit    Vomiting for 2wks Happens mainly in the middle of the night but also during the day. Starts to feel like she is choking starts coughing and then vomits. Hands and fingers are shaky, she is dropping things it has been going on for sometime just getting worse.      Here for several symptoms.  For the past 2 weeks has had random vomiting.  It is worse at night as she will wake out of her sleep choking and vomit.  When she vomits she tends to have some urinary incontinence as well.  This all started within the past 2 weeks.  She has had some cough going on for longer than that.  She does not feel nauseated when she vomits.  The vomit thing is random.   She does not vomit every day but has been quite frequent for the past 2 weeks  She does use a CPAP but does not use every day.  She has loss significant weight in the last couple years intentionally but her pressure on the CPAP has not changed.  She does not use as often now.  She has not used it several days recently so does not seem to make a difference with the vomiting  She is a smoker.  No trouble swallowing in general.  She is on Mounjaro , Jardiance  and metformin  for diabetes.  She has been on 5 mg Mounjaro  for at least 2 months even before the symptoms.  She has recently had problems with both hands, she will drop things due to weakness.  She feels swollen hands sometimes and sometimes pain and numbness.  No other arm complaint.  No other aggravating or relieving factors.    No other c/o.  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 75 mL 3   albuterol  (VENTOLIN  HFA) 108 (90 Base) MCG/ACT inhaler USE 2 INHALATIONS BY MOUTH EVERY 6 HOURS AS NEEDED FOR WHEEZING  OR SHORTNESS OF BREATH 72 g 2   amitriptyline  (ELAVIL ) 100 MG tablet Take 1 tablet (100 mg total) by mouth at bedtime. 90 tablet 3   atorvastatin  (LIPITOR) 20 MG tablet Take 1 tablet (20 mg total) by mouth daily. 90 tablet 1   baclofen  (LIORESAL ) 10 MG tablet Take 3 tablets (30 mg total) by mouth 3 (three) times daily. Do not take with cyclobenzaprine . MAX DOSE. 90 tablet 11   Blood Glucose Monitoring Suppl (ACCU-CHEK GUIDE) w/Device KIT 1 Device by Does not apply route daily. Accuchek guide kit 1 kit 0   Blood Glucose Monitoring Suppl (TRUE METRIX AIR  GLUCOSE METER) DEVI 1 Device by Does not apply route daily. 1 each 0   Blood Glucose Monitoring Suppl DEVI 1 each by Does not apply route in the morning, at noon, and at bedtime. May substitute to any manufacturer covered by patient's insurance. 1 each 0   Budeson-Glycopyrrol-Formoterol (BREZTRI  AEROSPHERE) 160-9-4.8 MCG/ACT AERO Inhale 2 puffs into the lungs 2 (two) times daily. 32.1 g 1   budesonide  (PULMICORT ) 0.25 MG/2ML nebulizer solution Take 2 mLs (0.25 mg total) by nebulization daily. Use with Yupelri  and Ipratropium in place of daily inhaler for flairs. 180 mL 1   cyclobenzaprine  (FLEXERIL ) 10 MG tablet Take 1 tablet (10 mg total) by mouth daily. 90 tablet 1   EPINEPHrine  0.3 mg/0.3 mL IJ SOAJ injection Inject 0.3 mg into the muscle as needed for anaphylaxis. 1 each 0   gabapentin  (NEURONTIN ) 300 MG  capsule TAKE 5 CAPSULES BY MOUTH 3 TIMES DAILY 450 capsule 2   glucose blood (ACCU-CHEK GUIDE TEST) test strip 1 each by Other route daily. 200 strip 1   hydrochlorothiazide  (HYDRODIURIL ) 12.5 MG tablet Take 1 tablet (12.5 mg total) by mouth daily. 90 tablet 1   ipratropium (ATROVENT ) 0.02 % nebulizer solution Take 2.5 mLs (0.5 mg total) by nebulization every 6 (six) hours as needed for wheezing or shortness of breath. Use once daily with budesonide  and Yupelri  in place of Daily inhaler during flairs. 75 mL 12   JARDIANCE  25 MG TABS tablet Take 1 tablet (25 mg total) by mouth daily. 90 tablet 1   Lancet Device MISC For monitoring blood sugar up to 4 times a day. May substitute to any manufacturer covered by patient's insurance. 200 each 99   Lancets Misc. MISC For monitoring blood sugar up to 4 times a day. May substitute to any manufacturer covered by patient's insurance. 200 each 99   levocetirizine (XYZAL ) 5 MG tablet Take 1 tablet (5 mg total) by mouth at bedtime as needed for allergies. 30 tablet 11   meclizine  (ANTIVERT ) 25 MG tablet Take 1 tablet (25 mg total) by mouth 3 (three) times daily. 270 tablet 1   meloxicam  (MOBIC ) 15 MG tablet Take 1 tablet (15 mg total) by mouth daily. 90 tablet 1   metFORMIN  (GLUCOPHAGE -XR) 500 MG 24 hr tablet Take 2 tablets (1,000 mg total) by mouth 2 (two) times daily. 360 tablet 1   mometasone  (ELOCON ) 0.1 % cream Apply a thin layer to the fingers with rash before bed for 1 week. Repeat as necessary for rash recurrence. 45 g 1   montelukast  (SINGULAIR ) 10 MG tablet Take 1 tablet (10 mg total) by mouth at bedtime. 90 tablet 1   Multiple Vitamin (MULTIVITAMIN) tablet Take 1 tablet by mouth daily.     promethazine -dextromethorphan (PROMETHAZINE -DM) 6.25-15 MG/5ML syrup Take 5 mLs by mouth 4 (four) times daily as needed. Use for up to 10 days then stop. 118 mL 0   RABEprazole  (ACIPHEX ) 20 MG tablet Take 1 tablet (20 mg total) by mouth daily. 90 tablet 1    revefenacin  (YUPELRI ) 175 MCG/3ML nebulizer solution Take 3 mLs (175 mcg total) by nebulization daily. Use with ipratropium and budesonide . 270 mL 1   telmisartan  (MICARDIS ) 40 MG tablet Take 1 tablet (40 mg total) by mouth daily. 90 tablet 1   tirzepatide  (MOUNJARO ) 5 MG/0.5ML Pen Inject 5 mg into the skin once a week. 6 mL 1   Vitamin D , Ergocalciferol , (DRISDOL ) 1.25 MG (50000 UNIT) CAPS capsule Take 1 capsule (50,000 Units total) by  mouth every 7 (seven) days. 12 capsule 1   hydrOXYzine  (VISTARIL ) 25 MG capsule Take 1 capsule (25 mg total) by mouth every 8 (eight) hours as needed. (Patient not taking: Reported on 03/21/2024) 30 capsule 0   predniSONE  (DELTASONE ) 20 MG tablet Take 2 tablets (40 mg total) by mouth daily with breakfast. (Patient not taking: Reported on 03/21/2024) 10 tablet 0   No current facility-administered medications on file prior to visit.   Past Surgical History:  Procedure Laterality Date   CESAREAN SECTION     HAND SURGERY Right    trigger finger   LUMBAR DISC SURGERY  2011   SHOULDER ARTHROSCOPY WITH OPEN ROTATOR CUFF REPAIR AND DISTAL CLAVICLE ACROMINECTOMY Left 11/24/2023   Procedure: LEFT SHOULDER ARTHROSCOPY, DEBRIDEMENT, BICEPS TENODESIS, ARTHROSCOPIC DISTAL CLAVICLE EXCISION, MINI OPEN ROTATOR CUFF TENDON REPAIR;  Surgeon: Jasmine Mesi, MD;  Location: [facility] OR;  Service: Orthopedics;  Laterality: Left;   UMBILICAL HERNIA REPAIR N/A 07/20/2022   Procedure: OPEN UMBILICAL HERNIA REPAIR WITH MESH;  Surgeon: Stechschulte, Avon Boers, MD;  Location: [facility];  Service: General;  Laterality: N/A;     The following portions of the patient's history were reviewed and updated as appropriate: allergies, current medications, past family history, past medical history, past social history, past surgical history and problem list.  ROS Otherwise as in subjective above  Objective: BP 122/86   Pulse 82   Temp (!) 96.4 F (35.8 C)   Wt 180 lb 3.2 oz (81.7 kg)   LMP  11/02/2015   SpO2 96%   BMI 31.92 kg/m   General appearance: alert, no distress, well developed, well nourished HEENT: normocephalic, sclerae anicteric, conjunctiva pink and moist, TMs pearly, nares patent, no discharge or erythema, pharynx normal Oral cavity: MMM, no lesions Neck: supple, no lymphadenopathy, no thyromegaly, no masses Heart: RRR, normal S1, S2, no murmurs Lungs: CTA bilaterally, no wheezes, rhonchi, or rales Abdomen: +bs, soft, non tender, non distended, no masses, no hepatomegaly, no splenomegaly Pulses: 2+ radial pulses, 2+ pedal pulses, normal cap refill Ext: no edema   Assessment: Encounter Diagnoses  Name Primary?   Cough, unspecified type Yes   Vomiting without nausea, unspecified vomiting type    High risk medication use    Smoker    Abdominal discomfort    Hand weakness      Plan: We discussed her several symptoms.  Symptoms could related to medication adverse effect, could be related to underlying issue in the abdomen or chest, possibly even related to CPAP pressure being too strong but she has not been using the CPAP as much so that may not be an issue  We will pursue labs and chest x-ray  I recommended short-term to cut the metformin  in half in case that is contributing to the symptoms.  She is due to take her next Mounjaro  in 3 days.  I asked her to hold off on this if her symptoms persist by that time  We may end up needing abdominal imaging as well but we will start with the labs and a chest x-ray  Kateland "Lonne Roan" was seen today for acute visit.  Diagnoses and all orders for this visit:  Cough, unspecified type -     DG Chest 2 View; Future  Vomiting without nausea, unspecified vomiting type -     Cancel: Comprehensive metabolic panel with GFR -     Cancel: CBC with Differential/Platelet -     Cancel: Lipase -     DG Chest  2 View; Future -     Comprehensive metabolic panel with GFR; Future -     CBC with Differential/Platelet;  Future  High risk medication use -     Cancel: Comprehensive metabolic panel with GFR -     Cancel: CBC with Differential/Platelet -     Cancel: Lipase -     Comprehensive metabolic panel with GFR; Future -     CBC with Differential/Platelet; Future  Smoker  Abdominal discomfort -     Cancel: Comprehensive metabolic panel with GFR -     Cancel: CBC with Differential/Platelet -     Cancel: Lipase -     Comprehensive metabolic panel with GFR; Future -     CBC with Differential/Platelet; Future -     Lipase; Future  Hand weakness -     Cancel: B12 and Folate Panel -     B12 and Folate Panel; Future    Follow up: Pending labs and chest x-ray

## 2024-03-21 NOTE — Procedures (Unsigned)
 EMG & NCV Findings: All nerve conduction studies (as indicated in the following tables) were within normal limits.    All examined muscles (as indicated in the following table) showed no evidence of electrical instability.    Impression: Essentially NORMAL electrodiagnostic study of the right lower limb.  There is no significant electrodiagnostic evidence of nerve entrapment, lumbosacral plexopathy or lumbar radiculopathy.  As you know, purely sensory or demyelinating radiculopathies and chemical radiculitis may not be detected with this particular electrodiagnostic study.  Recommendations: 1.  Follow-up with referring physician. 2.  Continue current management of symptoms.  ___________________________ Collin Deal FAAPMR Board Certified, American Board of Physical Medicine and Rehabilitation    Nerve Conduction Studies Anti Sensory Summary Table   Stim Site NR Peak (ms) Norm Peak (ms) P-T Amp (V) Norm P-T Amp Site1 Site2 Delta-P (ms) Dist (cm) Vel (m/s) Norm Vel (m/s)  Right Saphenous Anti Sensory (Ant Med Mall)  28.4C  14cm    3.5 <4.4 17.1 >2 14cm Ant Med Mall 3.5 0.0  >32  Right Sup Fibular Anti Sensory (Ant Lat Mall)  28.4C  14 cm    3.6 <4.4 9.1 >5.0 14 cm Ant Lat Mall 3.6 14.0 39 >32  Right Sural Anti Sensory (Lat Mall)  28.1C  Calf    3.1 <4.0 12.5 >5.0 Calf Lat Mall 3.1 14.0 45 >35   Motor Summary Table   Stim Site NR Onset (ms) Norm Onset (ms) O-P Amp (mV) Norm O-P Amp Site1 Site2 Delta-0 (ms) Dist (cm) Vel (m/s) Norm Vel (m/s)  Right Fibular Motor (Ext Dig Brev)  28.2C  Ankle    3.5 <6.1 4.8 >2.5 B Fib Ankle 5.5 28.0 51 >38  B Fib    9.0  3.9  Poplt B Fib 1.6 9.0 56 >40  Poplt    10.6  3.7         Right Tibial Motor (Abd Hall Brev)  28.5C  Ankle    3.3 <6.1 8.5 >3.0 Knee Ankle 6.8 38.0 56 >35  Knee    10.1  7.1          EMG   Side Muscle Nerve Root Ins Act Fibs Psw Amp Dur Poly Recrt Int Deatra Face Comment  Right AntTibialis Dp Br Peron L4-5 Nml Nml Nml Nml Nml 0  Nml Nml   Right Fibularis Longus  Sup Br Peron L5-S1 Nml Nml Nml Nml Nml 0 Nml Nml   Right MedGastroc Tibial S1-2 Nml Nml Nml Nml Nml 0 Nml Nml   Right VastusMed Femoral L2-4 Nml Nml Nml Nml Nml 0 Nml Nml   Right BicepsFemS Sciatic L5-S1 Nml Nml Nml Nml Nml 0 Nml Nml     Nerve Conduction Studies Anti Sensory Left/Right Comparison   Stim Site L Lat (ms) R Lat (ms) L-R Lat (ms) L Amp (V) R Amp (V) L-R Amp (%) Site1 Site2 L Vel (m/s) R Vel (m/s) L-R Vel (m/s)  Saphenous Anti Sensory (Ant Med Mall)  28.4C  14cm  3.5   17.1  14cm Ant Med Mall     Sup Fibular Anti Sensory (Ant Lat Mall)  28.4C  14 cm  3.6   9.1  14 cm Ant Lat Mall  39   Sural Anti Sensory (Lat Mall)  28.1C  Calf  3.1   12.5  Calf Lat Mall  45    Motor Left/Right Comparison   Stim Site L Lat (ms) R Lat (ms) L-R Lat (ms) L Amp (mV) R Amp (mV) L-R Amp (%)  Site1 Site2 L Vel (m/s) R Vel (m/s) L-R Vel (m/s)  Fibular Motor (Ext Dig Brev)  28.2C  Ankle  3.5   4.8  B Fib Ankle  51   B Fib  9.0   3.9  Poplt B Fib  56   Poplt  10.6   3.7        Tibial Motor (Abd Hall Brev)  28.5C  Ankle  3.3   8.5  Knee Ankle  56   Knee  10.1   7.1           Waveforms:

## 2024-03-22 ENCOUNTER — Other Ambulatory Visit

## 2024-03-22 DIAGNOSIS — R059 Cough, unspecified: Secondary | ICD-10-CM

## 2024-03-22 DIAGNOSIS — R1111 Vomiting without nausea: Secondary | ICD-10-CM | POA: Diagnosis not present

## 2024-03-22 DIAGNOSIS — R109 Unspecified abdominal pain: Secondary | ICD-10-CM

## 2024-03-22 DIAGNOSIS — Z79899 Other long term (current) drug therapy: Secondary | ICD-10-CM | POA: Diagnosis not present

## 2024-03-22 DIAGNOSIS — R29898 Other symptoms and signs involving the musculoskeletal system: Secondary | ICD-10-CM

## 2024-03-23 ENCOUNTER — Ambulatory Visit: Payer: Self-pay | Admitting: Medical

## 2024-03-23 LAB — COMPREHENSIVE METABOLIC PANEL WITH GFR
ALT: 30 IU/L (ref 0–32)
AST: 24 IU/L (ref 0–40)
Albumin: 3.7 g/dL — ABNORMAL LOW (ref 3.9–4.9)
Alkaline Phosphatase: 91 IU/L (ref 44–121)
BUN/Creatinine Ratio: 6 — ABNORMAL LOW (ref 9–23)
BUN: 6 mg/dL (ref 6–24)
Bilirubin Total: 0.3 mg/dL (ref 0.0–1.2)
CO2: 21 mmol/L (ref 20–29)
Calcium: 8.4 mg/dL — ABNORMAL LOW (ref 8.7–10.2)
Chloride: 103 mmol/L (ref 96–106)
Creatinine, Ser: 0.96 mg/dL (ref 0.57–1.00)
Globulin, Total: 1.8 g/dL (ref 1.5–4.5)
Glucose: 94 mg/dL (ref 70–99)
Potassium: 4.3 mmol/L (ref 3.5–5.2)
Sodium: 139 mmol/L (ref 134–144)
Total Protein: 5.5 g/dL — ABNORMAL LOW (ref 6.0–8.5)
eGFR: 73 mL/min/{1.73_m2} (ref >59)

## 2024-03-23 LAB — CBC WITH DIFFERENTIAL/PLATELET
Basophils Absolute: 0.1 10*3/uL (ref 0.0–0.2)
Basos: 1 %
EOS (ABSOLUTE): 0.2 10*3/uL (ref 0.0–0.4)
Eos: 2 %
Hematocrit: 43.8 % (ref 34.0–46.6)
Hemoglobin: 14.5 g/dL (ref 11.1–15.9)
Immature Grans (Abs): 0 10*3/uL (ref 0.0–0.1)
Immature Granulocytes: 0 %
Lymphocytes Absolute: 2.7 10*3/uL (ref 0.7–3.1)
Lymphs: 27 %
MCH: 26.8 pg (ref 26.6–33.0)
MCHC: 33.1 g/dL (ref 31.5–35.7)
MCV: 81 fL (ref 79–97)
Monocytes Absolute: 0.6 10*3/uL (ref 0.1–0.9)
Monocytes: 6 %
Neutrophils Absolute: 6.4 10*3/uL (ref 1.4–7.0)
Neutrophils: 64 %
Platelets: 383 10*3/uL (ref 150–450)
RBC: 5.41 x10E6/uL — ABNORMAL HIGH (ref 3.77–5.28)
RDW: 16.8 % — ABNORMAL HIGH (ref 11.7–15.4)
WBC: 10 10*3/uL (ref 3.4–10.8)

## 2024-03-23 LAB — B12 AND FOLATE PANEL
Folate: 9.5 ng/mL (ref >3.0)
Vitamin B-12: 365 pg/mL (ref 232–1245)

## 2024-03-23 LAB — LIPASE: Lipase: 40 U/L (ref 14–72)

## 2024-03-23 NOTE — Progress Notes (Signed)
 Results sent through MyChart

## 2024-03-27 ENCOUNTER — Ambulatory Visit
Admission: RE | Admit: 2024-03-27 | Discharge: 2024-03-27 | Disposition: A | Discharge status: Home / Self Care | Source: Ambulatory Visit | Attending: Medical | Admitting: Medical

## 2024-03-27 DIAGNOSIS — R918 Other nonspecific abnormal finding of lung field: Secondary | ICD-10-CM | POA: Diagnosis not present

## 2024-03-27 DIAGNOSIS — R059 Cough, unspecified: Secondary | ICD-10-CM | POA: Diagnosis not present

## 2024-03-27 DIAGNOSIS — R1110 Vomiting, unspecified: Secondary | ICD-10-CM | POA: Diagnosis not present

## 2024-03-28 ENCOUNTER — Other Ambulatory Visit: Payer: Self-pay | Admitting: Medical

## 2024-03-28 ENCOUNTER — Other Ambulatory Visit (HOSPITAL_COMMUNITY): Payer: Self-pay

## 2024-03-28 ENCOUNTER — Encounter: Payer: Self-pay | Admitting: Physical Medicine and Rehabilitation

## 2024-03-28 MED ORDER — AMOXICILLIN-POT CLAVULANATE 875-125 MG PO TABS
1.0000 | ORAL_TABLET | Freq: Two times a day (BID) | ORAL | 0 refills | Status: DC
  Filled 2024-03-28: qty 20, 10d supply, fill #0

## 2024-03-28 MED ORDER — AMOXICILLIN-POT CLAVULANATE 875-125 MG PO TABS: 1.0000 | ORAL_TABLET | Freq: Two times a day (BID) | ORAL | 0 refills | Status: DC

## 2024-03-28 NOTE — Progress Notes (Signed)
 Please call patient.  I saw her about a week ago but she just now got the x-ray yesterday.  X-ray x-ray shows possible pneumonia in the right lower lung or other abnormal finding.  Begin Augmentin antibiotic today.  I sent this to [facility] pharmacy.  Hydrate well.  I am assuming she made the other changes we discussed about the medications last visit.  Have her follow-up with Andra Banco or myself in the next week after being on the antibiotic  She will also need a repeat chest x-ray in 3 to 4 weeks to make sure this area is cleared up completely.  Monitor blood sugars as well and bring those in with her when she comes in.  Make sure she has a glucose monitor

## 2024-03-28 NOTE — Progress Notes (Signed)
 Lindsay Ellis - 48 y.o. female MRN [id number]  Date of birth: [birth year]  Office Visit Note: Visit Date: 03/20/2024 PCP: Annella Kief, NP Referred by: Jasmine Mesi, MD  Subjective: Chief Complaint  Patient presents with   Right Leg - Numbness, Pain, Tingling   HPI: Lindsay Ellis is a 48 y.o. female who comes in today at the request of Dr. Marykay Snipes for evaluation and management of chronic and severe pain, numbness and tingling in the Right lower extremities.  Patient is Right hand dominant.  She is a somewhat complicated patient with prior history of lumbar spine problems status post some type of remote surgery.  MRI of the lumbar spine from 2015 that I could see did not show much show much in the way of pathology.  She also is a non-insulin-dependent diabetic with hemoglobin A1c around 6.2.  She also carries sickle cell trait and has a history of being diagnosed with complex regional pain syndrome remotely but she still talks about her diagnosis of complex regional pain syndrome.  This was in the right upper limb.  She now reports pain numbness and tingling in the right lower extremity mainly the lateral leg into the anterior lateral leg and top of the foot.  This however can be somewhat global.  No symptoms on the left.  No prior history of peripheral polyneuropathy.  She reports feeling some weakness.  Dr. Eliberto Grosser been following her for her shoulder most recently.  Again most of her symptoms are from the knee to the toes globally.   I spent more than 30 minutes speaking face-to-face with the patient with 50% of the time in counseling and discussing coordination of care.      Review of Systems  Musculoskeletal:  Positive for back pain and joint pain.  Neurological:  Positive for tingling and focal weakness.  All other systems reviewed and are negative.  Otherwise per HPI.  Assessment & Plan: Visit Diagnoses:    ICD-10-CM   1. Paresthesia of skin  R20.2 NCV with EMG  (electromyography)    2. Pain in right leg  M79.604     3. Numbness of right foot  R20.0     4. Weakness  R53.1        Plan: Impression: Clinically hard to tell if this is more peripheral nerve versus polyneuropathy versus radiculopathy.  Symptoms are not really specifically dermatomal or peripheral nerve distribution.  She does have a history of complex regional pain gentleman in the upper extremity so cannot rule out neurologic condition.  Electrodiagnostic study performed today.  Essentially NORMAL electrodiagnostic study of the right lower limb.  There is no significant electrodiagnostic evidence of nerve entrapment, lumbosacral plexopathy or lumbar radiculopathy.  As you know, purely sensory or demyelinating radiculopathies and chemical radiculitis may not be detected with this particular electrodiagnostic study.  Recommendations: 1.  Follow-up with referring physician.  Consider lumbar spine MRI felt to be more radicular. 2.  Continue current management of symptoms.  Meds & Orders: No orders of the defined types were placed in this encounter.   Orders Placed This Encounter  Procedures   NCV with EMG (electromyography)    Follow-up: Return for  G. Lynita Saris, MD.   Procedures: No procedures performed  EMG & NCV Findings: All nerve conduction studies (as indicated in the following tables) were within normal limits.    All examined muscles (as indicated in the following table) showed no evidence of electrical instability.  Impression: Essentially NORMAL electrodiagnostic study of the right lower limb.  There is no significant electrodiagnostic evidence of nerve entrapment, lumbosacral plexopathy or lumbar radiculopathy.  As you know, purely sensory or demyelinating radiculopathies and chemical radiculitis may not be detected with this particular electrodiagnostic study.  Recommendations: 1.  Follow-up with referring physician.  Consider lumbar spine MRI felt to be more  radicular. 2.  Continue current management of symptoms.  ___________________________ Collin Deal FAAPMR Board Certified, American Board of Physical Medicine and Rehabilitation    Nerve Conduction Studies Anti Sensory Summary Table   Stim Site NR Peak (ms) Norm Peak (ms) P-T Amp (V) Norm P-T Amp Site1 Site2 Delta-P (ms) Dist (cm) Vel (m/s) Norm Vel (m/s)  Right Saphenous Anti Sensory (Ant Med Mall)  28.4C  14cm    3.5 <4.4 17.1 >2 14cm Ant Med Mall 3.5 0.0  >32  Right Sup Fibular Anti Sensory (Ant Lat Mall)  28.4C  14 cm    3.6 <4.4 9.1 >5.0 14 cm Ant Lat Mall 3.6 14.0 39 >32  Right Sural Anti Sensory (Lat Mall)  28.1C  Calf    3.1 <4.0 12.5 >5.0 Calf Lat Mall 3.1 14.0 45 >35   Motor Summary Table   Stim Site NR Onset (ms) Norm Onset (ms) O-P Amp (mV) Norm O-P Amp Site1 Site2 Delta-0 (ms) Dist (cm) Vel (m/s) Norm Vel (m/s)  Right Fibular Motor (Ext Dig Brev)  28.2C  Ankle    3.5 <6.1 4.8 >2.5 B Fib Ankle 5.5 28.0 51 >38  B Fib    9.0  3.9  Poplt B Fib 1.6 9.0 56 >40  Poplt    10.6  3.7         Right Tibial Motor (Abd Hall Brev)  28.5C  Ankle    3.3 <6.1 8.5 >3.0 Knee Ankle 6.8 38.0 56 >35  Knee    10.1  7.1          EMG   Side Muscle Nerve Root Ins Act Fibs Psw Amp Dur Poly Recrt Int Deatra Face Comment  Right AntTibialis Dp Br Peron L4-5 Nml Nml Nml Nml Nml 0 Nml Nml   Right Fibularis Longus  Sup Br Peron L5-S1 Nml Nml Nml Nml Nml 0 Nml Nml   Right MedGastroc Tibial S1-2 Nml Nml Nml Nml Nml 0 Nml Nml   Right VastusMed Femoral L2-4 Nml Nml Nml Nml Nml 0 Nml Nml   Right BicepsFemS Sciatic L5-S1 Nml Nml Nml Nml Nml 0 Nml Nml     Nerve Conduction Studies Anti Sensory Left/Right Comparison   Stim Site L Lat (ms) R Lat (ms) L-R Lat (ms) L Amp (V) R Amp (V) L-R Amp (%) Site1 Site2 L Vel (m/s) R Vel (m/s) L-R Vel (m/s)  Saphenous Anti Sensory (Ant Med Mall)  28.4C  14cm  3.5   17.1  14cm Ant Med Mall     Sup Fibular Anti Sensory (Ant Lat Mall)  28.4C  14 cm  3.6   9.1  14 cm  Ant Lat Mall  39   Sural Anti Sensory (Lat Mall)  28.1C  Calf  3.1   12.5  Calf Lat Mall  45    Motor Left/Right Comparison   Stim Site L Lat (ms) R Lat (ms) L-R Lat (ms) L Amp (mV) R Amp (mV) L-R Amp (%) Site1 Site2 L Vel (m/s) R Vel (m/s) L-R Vel (m/s)  Fibular Motor (Ext Dig Brev)  28.2C  Ankle  3.5   4.8  B Fib Ankle  51   B Fib  9.0   3.9  Poplt B Fib  56   Poplt  10.6   3.7        Tibial Motor (Abd Hall Brev)  28.5C  Ankle  3.3   8.5  Knee Ankle  56   Knee  10.1   7.1           Waveforms:            Clinical History: MRI Lumbar Spine Without Contrast  IMPRESSION: Limited noncontrast MR imaging of the lumbar spine reveals a disc herniation at L5/S1 with bilateral neural foraminal narrowing.  CLINICAL INDICATION: LUMBAR RADICULOPATHY, LEFT 724.4  back pain with radiation / LUMBAR RADICULOPATHY  TECHNIQUE: Sagittal T1 and T2 images of the lumbar spine are obtained without the administration of contrast. Patient could not tolerate further imaging. Axial images were not obtained..  COMPARISON: None.  INTERPRETATION:  Lumbar vertebral body heights and marrow signal are preserved. The L5/S1 intervertebral disc is desiccated and narrowed. Modic type endplate degenerative changes are noted at this level. Conus is normal in size, signal and position. Evaluation of paraspinal soft tissues is limited due to lack of axial images.  At L1/2 - L4/5, there is no significant neural foraminal narrowing or spinal canal stenosis. At L5/S1, there is a central disc protrusion superimposed on a disc bulge. The neural foramina are narrowed bilaterally. Spinal canal is mildly narrowed.  ATTENDING RADIOLOGIST: Barth Life, MD. Exam End: -  Specimen Collected: 04/11/14 10:10   She reports that she has been smoking cigarettes. She has never used smokeless tobacco.  Recent Labs    09/02/23 0909 11/18/23 0927  HGBA1C 6.1* 6.2*    Objective:  VS:  HT:    WT:   BMI:      BP:   HR: bpm  TEMP: ( )  RESP:  Physical Exam Vitals and nursing note reviewed.  Constitutional:      General: She is not in acute distress.    Appearance: Normal appearance. She is well-developed. She is obese. She is not ill-appearing.  HENT:     Head: Normocephalic and atraumatic.  Eyes:     Conjunctiva/sclera: Conjunctivae normal.     Pupils: Pupils are equal, round, and reactive to light.  Cardiovascular:     Rate and Rhythm: Normal rate.     Pulses: Normal pulses.  Pulmonary:     Effort: Pulmonary effort is normal.  Musculoskeletal:        General: Tenderness present.     Right lower leg: No edema.     Left lower leg: No edema.     Comments: Examination of both lower extremities shows good muscle tone and muscle bulk with no atrophy of the bilateral lower calf muscles or intrinsic foot musculature.  There is no swelling or sweating or allodynia.  There is good strength with dorsiflexion plantarflexion EHL bilaterally.  Subjective dysesthesia with palpation on the right more than left.  Negative slump test.  Skin:    General: Skin is warm and dry.     Findings: No erythema or rash.  Neurological:     General: No focal deficit present.     Mental Status: She is alert and oriented to person, place, and time.     Cranial Nerves: No cranial nerve deficit.     Sensory: No sensory deficit.     Motor: No weakness or abnormal muscle tone.     Coordination:  Coordination normal.     Gait: Gait abnormal.  Psychiatric:        Mood and Affect: Mood normal.        Behavior: Behavior normal.     Ortho Exam  Imaging: No results found.  Past Medical/Family/Surgical/Social History: Medications & Allergies reviewed per EMR, new medications updated. Patient Active Problem List   Diagnosis Date Noted   Acute non-recurrent pansinusitis 02/06/2024   Bronchitis 02/06/2024   Arthritis of left acromioclavicular joint 12/03/2023   Synovitis of left shoulder 12/03/2023   Dyshidrotic  eczema 09/02/2023   Vitamin D  deficiency 09/02/2023   Encounter for annual physical exam 09/02/2023   Degenerative superior labral anterior-to-posterior (SLAP) tear of left shoulder 09/02/2023   Asthma 08/05/2022   Lumbar radiculopathy 08/05/2022   Morbid obesity (HCC) 08/05/2022   Acquired trigger finger 05/26/2022   Diabetic renal disease (HCC) 05/18/2022   Essential hypertension 05/18/2022   Gastroesophageal reflux disease with esophagitis without hemorrhage 05/18/2022   Hyperglycemia due to type 2 diabetes mellitus (HCC) 05/18/2022   Hyperlipidemia associated with type 2 diabetes mellitus (HCC) 05/18/2022   Complex regional pain syndrome I, unspecified 05/18/2022   Primary osteoarthritis 05/18/2022   Sickle cell trait (HCC) 05/18/2022   Tachycardia-bradycardia (HCC) 10/20/2018   Obstructive sleep apnea treated with continuous positive airway pressure (CPAP) 06/06/2018   Sleep walking and eating 04/04/2018   Irregular menses 09/17/2014   Past Medical History:  Diagnosis Date   Asthma    Diabetes mellitus without complication (HCC)    GERD (gastroesophageal reflux disease)    Hyperlipidemia    Hypertension    Osteoarthritis    Poor compliance with CPAP treatment 10/02/2020   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   Sickle cell trait (HCC)    Sleep apnea    pt states she lost 100 lbs and no longer needs a CPAP   Tuberculosis    positive test TB, treated in 2003   Vertigo    Family History  Problem Relation Age of Onset   Lung cancer Mother    Breast cancer Neg Hx    BRCA 1/2 Neg Hx    Past Surgical History:  Procedure Laterality Date   CESAREAN SECTION     HAND SURGERY Right    trigger finger   LUMBAR DISC SURGERY  2011   SHOULDER ARTHROSCOPY WITH OPEN ROTATOR CUFF REPAIR AND DISTAL CLAVICLE ACROMINECTOMY Left 11/24/2023   Procedure: LEFT SHOULDER ARTHROSCOPY, DEBRIDEMENT, BICEPS TENODESIS, ARTHROSCOPIC DISTAL CLAVICLE EXCISION, MINI OPEN ROTATOR CUFF  TENDON REPAIR;  Surgeon: Jasmine Mesi, MD;  Location: [facility];  Service: Orthopedics;  Laterality: Left;   UMBILICAL HERNIA REPAIR N/A 07/20/2022   Procedure: OPEN UMBILICAL HERNIA REPAIR WITH MESH;  Surgeon: Stechschulte, Avon Boers, MD;  Location: [facility];  Service: General;  Laterality: N/A;   Social History   Occupational History   Not on file  Tobacco Use   Smoking status: Every Day    Current packs/day: 0.25    Types: Cigarettes   Smokeless tobacco: Never  Vaping Use   Vaping status: Never Used  Substance and Sexual Activity   Alcohol use: Not Currently   Drug use: Never   Sexual activity: Not on file

## 2024-04-01 ENCOUNTER — Other Ambulatory Visit: Payer: Self-pay | Admitting: Nurse Practitioner

## 2024-04-01 DIAGNOSIS — J4541 Moderate persistent asthma with (acute) exacerbation: Secondary | ICD-10-CM

## 2024-04-01 DIAGNOSIS — E1121 Type 2 diabetes mellitus with diabetic nephropathy: Secondary | ICD-10-CM

## 2024-04-01 DIAGNOSIS — E782 Mixed hyperlipidemia: Secondary | ICD-10-CM

## 2024-04-01 DIAGNOSIS — Z0000 Encounter for general adult medical examination without abnormal findings: Secondary | ICD-10-CM

## 2024-04-01 DIAGNOSIS — I10 Essential (primary) hypertension: Secondary | ICD-10-CM

## 2024-04-01 DIAGNOSIS — E1165 Type 2 diabetes mellitus with hyperglycemia: Secondary | ICD-10-CM

## 2024-04-04 ENCOUNTER — Ambulatory Visit (INDEPENDENT_AMBULATORY_CARE_PROVIDER_SITE_OTHER): Admitting: Medical

## 2024-04-04 VITALS — BP 110/64 | HR 80 | Ht 63.0 in | Wt 175.6 lb

## 2024-04-04 DIAGNOSIS — F17200 Nicotine dependence, unspecified, uncomplicated: Secondary | ICD-10-CM | POA: Diagnosis not present

## 2024-04-04 DIAGNOSIS — R9389 Abnormal findings on diagnostic imaging of other specified body structures: Secondary | ICD-10-CM | POA: Insufficient documentation

## 2024-04-04 DIAGNOSIS — Z79899 Other long term (current) drug therapy: Secondary | ICD-10-CM | POA: Insufficient documentation

## 2024-04-04 DIAGNOSIS — R1111 Vomiting without nausea: Secondary | ICD-10-CM | POA: Insufficient documentation

## 2024-04-04 DIAGNOSIS — R053 Chronic cough: Secondary | ICD-10-CM | POA: Insufficient documentation

## 2024-04-04 MED ORDER — BENZONATATE 200 MG PO CAPS: 200.0000 mg | ORAL_CAPSULE | Freq: Three times a day (TID) | ORAL | 0 refills | Status: DC | PRN

## 2024-04-04 NOTE — Addendum Note (Signed)
 Addended by: Charliene Conte A on: 04/04/2024 02:40 PM   Modules accepted: Orders

## 2024-04-04 NOTE — Progress Notes (Signed)
 Placed referral

## 2024-04-04 NOTE — Progress Notes (Signed)
 Subjective:  Lindsay Ellis is a 48 y.o. female who presents for Chief Complaint  Patient presents with   Medication check    Medication ck, medication changes didn't help / f/up on the pneum     Here for follow up.  Was seen here by me 03/21/24 for multiple symptoms.  She notes no improvement.  Symptoms continue to include vomiting periodically, still coughing a lot.  Feels like she can't get the cough all the way out.  She has recently had problems with both hands, she will drop things due to weakness.  She feels swollen hands sometimes and sometimes pain and numbness.  No other arm complaint.  Been having this months , maybe 6 months or more.   Usually numbness moreso than pain.  Has pins and needles felling in both hands.   Gets occasional neck pian.  Recently had left shoulder surgery.    Smokes daily.   Has tried to quit.  Is on 2nd day of not smoking.  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  Only drinks alcohol on occasion.  She is on several medications and some overlap.  She uses gabapentin  for neuropathy and RSD.  She uses Elavil  to help sleep through her daily pains  She has had prior nerve conduction studies in the legs but not on the arms.  History from last visit: At her last visit she noted random vomiting.  It is worse at night as she will wake out of her sleep choking and vomit.  When she vomits she tends to have some urinary incontinence as well.  This all started within the past 2 weeks.  She has had some cough going on for longer than that.  She does not feel nauseated when she vomits.  The vomit thing is random.   She does not vomit every day but has been quite frequent for the past 2 weeks  She does use a CPAP but does not use every day.  She has loss significant weight in the last couple years intentionally but her pressure on the CPAP has not changed.  She does not use as often now.  She has not used it several days recently so does not seem to make a difference with the  vomiting  No trouble swallowing in general.  She is on Mounjaro , Jardiance  and metformin  for diabetes.  She has been on 5 mg Mounjaro  for at least 2 months even before the symptoms.  No other aggravating or relieving factors.    No other c/o.  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 75 mL 3   albuterol  (VENTOLIN  HFA) 108 (90 Base) MCG/ACT inhaler USE 2 INHALATIONS BY MOUTH EVERY 6  HOURS AS NEEDED FOR WHEEZING  OR SHORTNESS OF BREATH 72 g 2   amitriptyline  (ELAVIL ) 100 MG tablet Take 1 tablet (100 mg total) by mouth at bedtime. 90 tablet 3   amoxicillin -clavulanate (AUGMENTIN ) 875-125 MG tablet Take 1 tablet by mouth 2 (two) times daily. 20 tablet 0   atorvastatin  (LIPITOR) 20 MG tablet TAKE 1 TABLET BY MOUTH DAILY 90 tablet 0   baclofen  (LIORESAL ) 10 MG tablet Take 3 tablets (30 mg total) by mouth 3 (three) times daily. Do not take with cyclobenzaprine . MAX DOSE. 90 tablet 11   Blood Glucose Monitoring Suppl (ACCU-CHEK GUIDE) w/Device KIT 1 Device by Does not apply route daily. Accuchek guide kit 1 kit 0   budesonide  (PULMICORT ) 0.25 MG/2ML nebulizer solution Take 2 mLs (0.25 mg total) by nebulization daily. Use with Yupelri  and Ipratropium in place of daily inhaler for flairs. 180 mL 1    budesonide -glycopyrrolate-formoterol (BREZTRI  AEROSPHERE) 160-9-4.8 MCG/ACT AERO inhaler USE 2 INHALATIONS BY MOUTH TWICE DAILY 32.1 g 1   cyclobenzaprine  (FLEXERIL ) 10 MG tablet Take 1 tablet (10 mg total) by mouth daily. 90 tablet 1   EPINEPHrine  0.3 mg/0.3 mL IJ SOAJ injection Inject 0.3 mg into the muscle as needed for anaphylaxis. 1 each 0   gabapentin  (NEURONTIN ) 300 MG capsule TAKE 5 CAPSULES BY MOUTH 3 TIMES DAILY 450 capsule 2   glucose blood (ACCU-CHEK GUIDE TEST) test strip 1 each by Other route daily. 200 strip 1   hydrochlorothiazide  (HYDRODIURIL ) 12.5 MG tablet TAKE 1 TABLET BY MOUTH DAILY 90 tablet 0   ipratropium (ATROVENT ) 0.02 % nebulizer solution Take 2.5 mLs (0.5 mg total) by nebulization every 6 (six) hours as needed for wheezing or shortness of breath. Use once daily with budesonide  and Yupelri  in place of Daily inhaler during flairs. 75 mL 12   JARDIANCE  25 MG TABS tablet TAKE 1 TABLET BY MOUTH DAILY 90 tablet 0   Lancet Device MISC For monitoring blood sugar up to 4 times a day. May substitute to any manufacturer covered by patient's insurance. 200 each 99   Lancets Misc. MISC For monitoring blood sugar up to 4 times a day. May substitute to any manufacturer covered by patient's insurance. 200 each 99   levocetirizine (XYZAL ) 5 MG tablet Take 1 tablet (5 mg total) by mouth at bedtime as needed for allergies. 30 tablet 11   meclizine  (ANTIVERT ) 25 MG tablet Take 1 tablet (25 mg total) by mouth 3 (three) times daily. 270 tablet 1   meloxicam  (MOBIC ) 15 MG tablet Take 1 tablet (15 mg total) by mouth daily. 90 tablet 1   metFORMIN  (GLUCOPHAGE -XR) 500 MG 24 hr tablet TAKE 2 TABLETS BY MOUTH TWICE  DAILY 360 tablet 0   mometasone  (ELOCON ) 0.1 % cream Apply a thin layer to the fingers with rash before bed for 1 week. Repeat as necessary for rash recurrence. 45 g 1   montelukast  (SINGULAIR ) 10 MG tablet TAKE 1 TABLET BY MOUTH AT  BEDTIME 90 tablet 0   Multiple Vitamin  (MULTIVITAMIN) tablet Take 1 tablet by mouth daily.     promethazine -dextromethorphan (PROMETHAZINE -DM) 6.25-15 MG/5ML syrup Take 5 mLs by mouth 4 (four) times daily as needed. Use for up to 10 days then stop. 118 mL 0   RABEprazole  (ACIPHEX ) 20 MG tablet TAKE 1 TABLET BY MOUTH DAILY 90 tablet 0   revefenacin  (YUPELRI ) 175 MCG/3ML nebulizer solution Take 3 mLs (175 mcg total) by nebulization daily. Use with ipratropium and budesonide . 270 mL 1  telmisartan  (MICARDIS ) 40 MG tablet TAKE 1 TABLET BY MOUTH DAILY 90 tablet 0   tirzepatide  (MOUNJARO ) 5 MG/0.5ML Pen Inject 5 mg into the skin once a week. 6 mL 1   Vitamin D , Ergocalciferol , (DRISDOL ) 1.25 MG (50000 UNIT) CAPS capsule Take 1 capsule (50,000 Units total) by mouth every 7 (seven) days. 12 capsule 1   Blood Glucose Monitoring Suppl (TRUE METRIX AIR GLUCOSE METER) DEVI 1 Device by Does not apply route daily. (Patient not taking: Reported on 04/04/2024) 1 each 0   Blood Glucose Monitoring Suppl DEVI 1 each by Does not apply route in the morning, at noon, and at bedtime. May substitute to any manufacturer covered by patient's insurance. 1 each 0   hydrOXYzine  (VISTARIL ) 25 MG capsule Take 1 capsule (25 mg total) by mouth every 8 (eight) hours as needed. (Patient not taking: Reported on 04/04/2024) 30 capsule 0   predniSONE  (DELTASONE ) 20 MG tablet Take 2 tablets (40 mg total) by mouth daily with breakfast. (Patient not taking: Reported on 04/04/2024) 10 tablet 0   No current facility-administered medications on file prior to visit.   Past Surgical History:  Procedure Laterality Date   CESAREAN SECTION     HAND SURGERY Right    trigger finger   LUMBAR DISC SURGERY  2011   SHOULDER ARTHROSCOPY WITH OPEN ROTATOR CUFF REPAIR AND DISTAL CLAVICLE ACROMINECTOMY Left 11/24/2023   Procedure: LEFT SHOULDER ARTHROSCOPY, DEBRIDEMENT, BICEPS TENODESIS, ARTHROSCOPIC DISTAL CLAVICLE EXCISION, MINI OPEN ROTATOR CUFF TENDON REPAIR;  Surgeon: Jasmine Mesi, MD;   Location: [facility];  Service: Orthopedics;  Laterality: Left;   UMBILICAL HERNIA REPAIR N/A 07/20/2022   Procedure: OPEN UMBILICAL HERNIA REPAIR WITH MESH;  Surgeon: Stechschulte, Avon Boers, MD;  Location: [facility];  Service: General;  Laterality: N/A;     The following portions of the patient's history were reviewed and updated as appropriate: allergies, current medications, past family history, past medical history, past social history, past surgical history and problem list.  ROS Otherwise as in subjective above  Objective: BP 110/64   Pulse 80   Ht 5\' 3"  (1.6 m)   Wt 175 lb 9.6 oz (79.7 kg)   LMP 11/02/2015   BMI 31.11 kg/m   General appearance: alert, no distress, well developed, well nourished Neck: supple, no lymphadenopathy, no thyromegaly, no masses Heart: RRR, normal S1, S2, no murmurs Lungs: coarse sounds, otherwise no wheezes, rhonchi, or rales Abdomen: +bs, soft, non tender, non distended, no masses, no hepatomegaly, no splenomegaly Pulses: 2+ radial pulses, 2+ pedal pulses, normal cap refill Ext: no edema   Assessment: Encounter Diagnoses  Name Primary?   Abnormal chest x-ray Yes   Chronic cough    Smoker    Vomiting without nausea, unspecified vomiting type    Polypharmacy       Plan: We reviewed her abnormal chest xray from last visit, recent labs.  She is taking antibiotic for possible pneumonia. Finish Augmentin , can use tessalon perles for cough since promethazine  DM not helping.   We will go ahead and pursue CT chest.  Referral placed for pulmonology consult as well given ongoing symptoms and findings.   I recommended short-term to cut the metformin  in half in case that is contributing to the symptoms.  She is due to take her next Mounjaro  in 3 days.  I asked her to hold off on this if her symptoms persist by that time  Referral to pharmacy consult given numerous medications, some overlap in medications and could  possible benefit from easier regimen.   Could combine Telmisartan  and HCT in combo pill or similar.    Reduce some of the antihistamine medication burden.     Lindsay "Lonne Roan" was seen today for medication check.  Diagnoses and all orders for this visit:  Abnormal chest x-ray -     CT Chest Wo Contrast; Future -     Ambulatory referral to Pulmonology  Chronic cough -     CT Chest Wo Contrast; Future -     Ambulatory referral to Pulmonology  Smoker -     CT Chest Wo Contrast; Future -     Ambulatory referral to Pulmonology  Vomiting without nausea, unspecified vomiting type  Polypharmacy  Other orders -     benzonatate (TESSALON) 200 MG capsule; Take 1 capsule (200 mg total) by mouth 3 (three) times daily as needed for cough.   Follow up: Pending CT chest, pharmacy referral, pulmonology referral

## 2024-04-05 ENCOUNTER — Telehealth: Payer: Self-pay

## 2024-04-05 ENCOUNTER — Telehealth: Payer: Self-pay | Admitting: *Deleted

## 2024-04-05 DIAGNOSIS — Z79899 Other long term (current) drug therapy: Secondary | ICD-10-CM

## 2024-04-05 NOTE — Progress Notes (Signed)
 Complex Care Management Note  Care Guide Note 04/05/2024 Name: Lindsay Ellis MRN: [id number] DOB: 12/06/1975  Lindsay Ellis is a 48 y.o. year old female who sees Early, Adriane Albe, NP for primary care. I reached out to Greig Leather by phone today to offer complex care management services.  Lindsay Ellis was given information about Complex Care Management services today including:   The Complex Care Management services include support from the care team which includes your Nurse Care Manager, Clinical Social Worker, or Pharmacist.  The Complex Care Management team is here to help remove barriers to the health concerns and goals most important to you. Complex Care Management services are voluntary, and the patient may decline or stop services at any time by request to their care team member.   Complex Care Management Consent Status: Patient agreed to services and verbal consent obtained.   Follow up plan:  Telephone appointment with complex care management team member scheduled for:  04-17-24  Encounter Outcome:  Patient Scheduled   [facility], [facility] Guide  Direct Dial: [phone]  Fax [phone]

## 2024-04-08 ENCOUNTER — Other Ambulatory Visit: Payer: Self-pay | Admitting: Nurse Practitioner

## 2024-04-08 DIAGNOSIS — E1121 Type 2 diabetes mellitus with diabetic nephropathy: Secondary | ICD-10-CM

## 2024-04-08 DIAGNOSIS — K2100 Gastro-esophageal reflux disease with esophagitis, without bleeding: Secondary | ICD-10-CM

## 2024-04-08 DIAGNOSIS — E1165 Type 2 diabetes mellitus with hyperglycemia: Secondary | ICD-10-CM

## 2024-04-08 DIAGNOSIS — I10 Essential (primary) hypertension: Secondary | ICD-10-CM

## 2024-04-08 DIAGNOSIS — E782 Mixed hyperlipidemia: Secondary | ICD-10-CM

## 2024-04-10 ENCOUNTER — Ambulatory Visit
Admission: RE | Admit: 2024-04-10 | Discharge: 2024-04-10 | Disposition: A | Discharge status: Home / Self Care | Source: Ambulatory Visit | Attending: Medical | Admitting: Medical

## 2024-04-10 ENCOUNTER — Other Ambulatory Visit

## 2024-04-10 DIAGNOSIS — J45998 Other asthma: Secondary | ICD-10-CM | POA: Diagnosis not present

## 2024-04-10 DIAGNOSIS — I2510 Atherosclerotic heart disease of native coronary artery without angina pectoris: Secondary | ICD-10-CM | POA: Diagnosis not present

## 2024-04-10 DIAGNOSIS — R911 Solitary pulmonary nodule: Secondary | ICD-10-CM | POA: Diagnosis not present

## 2024-04-10 DIAGNOSIS — F17200 Nicotine dependence, unspecified, uncomplicated: Secondary | ICD-10-CM

## 2024-04-10 DIAGNOSIS — R9389 Abnormal findings on diagnostic imaging of other specified body structures: Secondary | ICD-10-CM

## 2024-04-10 DIAGNOSIS — J219 Acute bronchiolitis, unspecified: Secondary | ICD-10-CM | POA: Diagnosis not present

## 2024-04-10 DIAGNOSIS — R053 Chronic cough: Secondary | ICD-10-CM

## 2024-04-16 ENCOUNTER — Ambulatory Visit: Payer: Self-pay | Admitting: Internal Medicine

## 2024-04-17 ENCOUNTER — Other Ambulatory Visit (INDEPENDENT_AMBULATORY_CARE_PROVIDER_SITE_OTHER)

## 2024-04-17 DIAGNOSIS — Z79899 Other long term (current) drug therapy: Secondary | ICD-10-CM

## 2024-04-17 NOTE — Progress Notes (Signed)
 04/17/2024 Name: Lindsay Ellis MRN: [id number] DOB: July 15, 1976  Chief Complaint  Patient presents with   Medication Management    Lindsay Ellis is a 48 y.o. year old female who presented for a telephone visit.   They were referred to the pharmacist by their PCP for assistance in managing complex medication management.    Subjective:  Care Team: Primary Care Provider: Annella Kief, NP ; Next Scheduled Visit: 06/12/24  Medication Access/Adherence  Current Pharmacy:  [facility] - [place], [place] - [address] [place] [address] Phone: [phone] Fax: [phone]  [facility] [address] [place] [address] Phone: [phone] Fax: [phone]  [id number] - [place], [place] - [address] AT [facility] [address] [facility] [address] [place] [address] Phone: [phone] Fax: [phone]  [facility] - [place], [place] - [address] [place] [address] Phone: [phone] Fax: [phone]   Patient reports affordability concerns with their medications: No  Patient reports access/transportation concerns to their pharmacy: No  Patient reports adherence concerns with their medications:  No     Diabetes:  Current medications: Jardiance  25mg , Mounjaro  5mg  once weekly on Sundays, Metformin  2 tabs twice daily Medications tried in the past: None  Current glucose readings: 126 this morning fasting Using finger prick meter, checking twice daily. Denies any low BG, does report seeing some sugars a little above 130 fasting but mostly controlled   Observed patterns:  Patient denies hypoglycemic s/sx including dizziness, shakiness, sweating. Patient denies hyperglycemic symptoms including polyuria, polydipsia, polyphagia, nocturia, neuropathy, blurred vision.   Hypertension:  Current  medications: Telmisartan  40mg , hydrochlorothiazide  12.5mg  Medications previously tried: None  Patient does not have a validated, automated, upper arm home BP cuff  Patient reports hypotensive s/sx including feeling faint, states it happens infrequently Patient denies hypertensive symptoms including headache, chest pain, shortness of breath  Does report dizziness but suffers from dizzy spells and takes meclizine  regularly   Asthma:  Current medications: Yupelri , Budesonide , Ipratropium, Albuterol  HFA and solution, Montelukast  10mg , Xyzal  prn, Breztri  2 puffs BID Medications tried in the past: None  Reports that she infrequently uses albuterol  neb prn, and that the yupelri , budesonide  and ipratropium she mixes together to use for severe astham episodes infrequently  Chronic Pain Current Meds: Baclofen  30mg  TID, Gabapentin  300mg  5 capsules TID, Flexeril  10mg  prn severe pain, Meloxicam  15mg  daily  Denies any issues with sedation, does not like her pill burden at this time   Objective:  Lab Results  Component Value Date   HGBA1C 6.2 (H) 11/18/2023    Lab Results  Component Value Date   CREATININE 0.96 03/22/2024   BUN 6 03/22/2024   NA 139 03/22/2024   K 4.3 03/22/2024   CL 103 03/22/2024   CO2 21 03/22/2024    Lab Results  Component Value Date   CHOL 163 09/02/2023   HDL 45 09/02/2023   LDLCALC 101 (H) 09/02/2023   LDLDIRECT 86 01/04/2023   TRIG 93 09/02/2023   CHOLHDL 3.6 09/02/2023    Medications Reviewed Today     Reviewed by Carnell Christian, RPH (Pharmacist) on 04/17/24 at 1525  Med List Status: <None>   Medication Order Taking? Sig Documenting Provider Last Dose Status Informant  Accu-Chek Softclix Lancets lancets [id number]  Use as instructed Early, Sara E, NP  Active   albuterol  (  ACCUNEB ) 1.25 MG/3ML nebulizer solution [id number] Yes Take 3 mLs (1.25 mg total) by nebulization every 6 (six) hours as needed for wheezing. Early, Sara E, NP  Active   albuterol   (VENTOLIN  HFA) 108 (90 Base) MCG/ACT inhaler [id number] Yes USE 2 INHALATIONS BY MOUTH EVERY 6 HOURS AS NEEDED FOR WHEEZING  OR SHORTNESS OF BREATH Early, Sara E, NP  Active   amitriptyline  (ELAVIL ) 100 MG tablet [id number] Yes Take 1 tablet (100 mg total) by mouth at bedtime. Annella Kief, NP  Active Self    Discontinued 04/17/24 1502 (Completed Course)   atorvastatin  (LIPITOR) 20 MG tablet [id number] Yes TAKE 1 TABLET BY MOUTH DAILY Early, Sara E, NP  Active   baclofen  (LIORESAL ) 10 MG tablet [id number] Yes Take 3 tablets (30 mg total) by mouth 3 (three) times daily. Do not take with cyclobenzaprine . MAX DOSE. Early, Sara E, NP  Active     Discontinued 04/17/24 1502 (Completed Course)   Blood Glucose Monitoring Suppl (ACCU-CHEK GUIDE) w/Device KIT [id number]  1 Device by Does not apply route daily. Accuchek guide kit Early, Sara E, NP  Active   Blood Glucose Monitoring Suppl (TRUE METRIX AIR GLUCOSE METER) DEVI [id number]  1 Device by Does not apply route daily.  Patient not taking: Reported on 04/04/2024   Early, Sara E, NP  Active Self  Blood Glucose Monitoring Suppl DEVI [id number]  1 each by Does not apply route in the morning, at noon, and at bedtime. May substitute to any manufacturer covered by patient's insurance. Early, Sara E, NP  Active Self  budesonide  (PULMICORT ) 0.25 MG/2ML nebulizer solution [id number] Yes Take 2 mLs (0.25 mg total) by nebulization daily. Use with Yupelri  and Ipratropium in place of daily inhaler for flairs. Early, Sara E, NP  Active   budesonide -glycopyrrolate-formoterol (BREZTRI  AEROSPHERE) 160-9-4.8 MCG/ACT AERO inhaler [id number] Yes USE 2 INHALATIONS BY MOUTH TWICE DAILY Early, Sara E, NP  Active   cyclobenzaprine  (FLEXERIL ) 10 MG tablet [id number] Yes Take 1 tablet (10 mg total) by mouth daily. Early, Sara E, NP  Active   EPINEPHrine  0.3 mg/0.3 mL IJ SOAJ injection [id number]  Inject 0.3 mg into the muscle as needed for anaphylaxis. Rafael Bun A, DO  Active   gabapentin   (NEURONTIN ) 300 MG capsule [id number] Yes TAKE 5 CAPSULES BY MOUTH 3 TIMES DAILY Early, Sara E, NP  Active   glucose blood (ACCU-CHEK GUIDE TEST) test strip [id number]  1 each by Other route daily. Early, Sara E, NP  Active   hydrochlorothiazide  (HYDRODIURIL ) 12.5 MG tablet [id number] Yes TAKE 1 TABLET BY MOUTH DAILY Early, Sara E, NP  Active   ipratropium (ATROVENT ) 0.02 % nebulizer solution [id number] Yes Take 2.5 mLs (0.5 mg total) by nebulization every 6 (six) hours as needed for wheezing or shortness of breath. Use once daily with budesonide  and Yupelri  in place of Daily inhaler during flairs. Early, Sara E, NP  Active Self  JARDIANCE  25 MG TABS tablet [id number] Yes TAKE 1 TABLET BY MOUTH DAILY Early, Sara E, NP  Active   Lancet Device MISC [id number]  For monitoring blood sugar up to 4 times a day. May substitute to any manufacturer covered by patient's insurance. Early, Sara E, NP  Active Self  Lancets Misc. MISC [id number]  For monitoring blood sugar up to 4 times a day. May substitute to any manufacturer covered by patient's insurance. Early, Sara E, NP  Active Self  levocetirizine (XYZAL ) 5 MG tablet [id number] Yes Take 1 tablet (5 mg total) by mouth  at bedtime as needed for allergies. Early, Sara E, NP  Active   meclizine  (ANTIVERT ) 25 MG tablet [id number] Yes Take 1 tablet (25 mg total) by mouth 3 (three) times daily. Early, Sara E, NP  Active Self  meloxicam  (MOBIC ) 15 MG tablet [id number] Yes Take 1 tablet (15 mg total) by mouth daily. Early, Sara E, NP  Active   metFORMIN  (GLUCOPHAGE -XR) 500 MG 24 hr tablet [id number] Yes TAKE 2 TABLETS BY MOUTH TWICE  DAILY Early, Sara E, NP  Active   mometasone  (ELOCON ) 0.1 % cream [id number] Yes Apply a thin layer to the fingers with rash before bed for 1 week. Repeat as necessary for rash recurrence. Early, Sara E, NP  Active Self  montelukast  (SINGULAIR ) 10 MG tablet [id number] Yes TAKE 1 TABLET BY MOUTH AT  BEDTIME Early, Sara E, NP  Active   Multiple Vitamin  (MULTIVITAMIN) tablet [id number] Yes Take 1 tablet by mouth daily. [provider]  Active Self  RABEprazole  (ACIPHEX ) 20 MG tablet [id number] Yes TAKE 1 TABLET BY MOUTH DAILY Early, Sara E, NP  Active   revefenacin  (YUPELRI ) 175 MCG/3ML nebulizer solution [id number] Yes Take 3 mLs (175 mcg total) by nebulization daily. Use with ipratropium and budesonide . Early, Sara E, NP  Active Self  telmisartan  (MICARDIS ) 40 MG tablet [id number] Yes TAKE 1 TABLET BY MOUTH DAILY Early, Sara E, NP  Active   tirzepatide  (MOUNJARO ) 5 MG/0.5ML Pen [id number] Yes Inject 5 mg into the skin once a week. Early, Sara E, NP  Active   Vitamin D , Ergocalciferol , (DRISDOL ) 1.25 MG (50000 UNIT) CAPS capsule [id number] Yes Take 1 capsule (50,000 Units total) by mouth every 7 (seven) days. Annella Kief, NP  Active               Assessment/Plan:   Diabetes: - Currently controlled - Reviewed long term cardiovascular and renal outcomes of uncontrolled blood sugar - Reviewed goal A1c, goal fasting, and goal 2 hour post prandial glucose - Recommend to continue current medication therapy  - Patient denies personal or family history of multiple endocrine neoplasia type 2, medullary thyroid  cancer; personal history of pancreatitis or gallbladder disease. - Recommend to check glucose once to twice daily. Future consideration: maximize mounjaro  dose and consider stopping metformin  if needed     Hypertension: - Currently controlled - Reviewed long term cardiovascular and renal outcomes of uncontrolled blood pressure - Reviewed appropriate blood pressure monitoring technique and reviewed goal blood pressure. Recommended to check home blood pressure and heart rate 2-3x/week. Counseled on hydration - Recommend to continue current medication. Future consideration: switch to combo pill of telmisartan -hydrochlorothiazide  if patient's dizzy spells subside and patient has control with home monitoring      Asthma: -  Currently controlled.  -Continue current medication therapy  Chronic Pain -Patient would like to try to decrease dose of pain medications. DECREASE baclofen  to 3 tabs qam, afternoon and TWO tabs in evening, continue other medication therapy -Counseled on sedation risk with her combo and need to taper off slowly for her medications if tolerable  Follow Up Plan: 2 weeks  Carnell Christian, PharmD Clinical Pharmacist [phone]

## 2024-04-20 ENCOUNTER — Other Ambulatory Visit: Payer: Self-pay | Admitting: Nurse Practitioner

## 2024-04-20 DIAGNOSIS — J4551 Severe persistent asthma with (acute) exacerbation: Secondary | ICD-10-CM

## 2024-04-30 ENCOUNTER — Other Ambulatory Visit: Payer: Self-pay | Admitting: Nurse Practitioner

## 2024-04-30 DIAGNOSIS — E782 Mixed hyperlipidemia: Secondary | ICD-10-CM

## 2024-04-30 DIAGNOSIS — K2100 Gastro-esophageal reflux disease with esophagitis, without bleeding: Secondary | ICD-10-CM

## 2024-04-30 DIAGNOSIS — E1165 Type 2 diabetes mellitus with hyperglycemia: Secondary | ICD-10-CM

## 2024-04-30 DIAGNOSIS — I10 Essential (primary) hypertension: Secondary | ICD-10-CM

## 2024-04-30 DIAGNOSIS — E1121 Type 2 diabetes mellitus with diabetic nephropathy: Secondary | ICD-10-CM

## 2024-05-03 ENCOUNTER — Other Ambulatory Visit

## 2024-05-03 ENCOUNTER — Telehealth: Payer: Self-pay

## 2024-05-03 NOTE — Progress Notes (Signed)
   05/03/2024  Patient ID: Lindsay Ellis, female   DOB: [birth year], 48 y.o.   MRN: [id number]  Attempted to contact patient for scheduled appointment for medication management. Left HIPAA compliant message for patient to return my call at their convenience.    Jon VEAR Lindau, PharmD Clinical Pharmacist [phone]

## 2024-05-03 NOTE — Progress Notes (Deleted)
   05/03/2024  Patient ID: Lindsay Ellis, female   DOB: [birth year], 48 y.o.   MRN: [id number]  Attempted to contact patient for scheduled appointment for medication management. Left HIPAA compliant message for patient to return my call at their convenience.    Jon VEAR Lindau, PharmD Clinical Pharmacist [phone]

## 2024-05-07 ENCOUNTER — Other Ambulatory Visit: Payer: Self-pay | Admitting: Nurse Practitioner

## 2024-05-07 DIAGNOSIS — E1121 Type 2 diabetes mellitus with diabetic nephropathy: Secondary | ICD-10-CM

## 2024-05-07 DIAGNOSIS — K2100 Gastro-esophageal reflux disease with esophagitis, without bleeding: Secondary | ICD-10-CM

## 2024-05-07 DIAGNOSIS — I10 Essential (primary) hypertension: Secondary | ICD-10-CM

## 2024-05-07 DIAGNOSIS — E1165 Type 2 diabetes mellitus with hyperglycemia: Secondary | ICD-10-CM

## 2024-05-07 DIAGNOSIS — E782 Mixed hyperlipidemia: Secondary | ICD-10-CM

## 2024-05-08 NOTE — Telephone Encounter (Signed)
 Last apt 02/03/24.

## 2024-05-10 DIAGNOSIS — J45998 Other asthma: Secondary | ICD-10-CM | POA: Diagnosis not present

## 2024-05-16 ENCOUNTER — Other Ambulatory Visit: Payer: Self-pay

## 2024-05-16 ENCOUNTER — Telehealth: Payer: Self-pay

## 2024-05-16 DIAGNOSIS — Z9109 Other allergy status, other than to drugs and biological substances: Secondary | ICD-10-CM

## 2024-05-16 MED ORDER — LEVOCETIRIZINE DIHYDROCHLORIDE 5 MG PO TABS: 5.0000 mg | ORAL_TABLET | Freq: Every evening | ORAL | 1 refills | Status: DC | PRN

## 2024-05-16 NOTE — Telephone Encounter (Signed)
 New Refill request: Levocetirizine 5 mg @ [facility]

## 2024-05-20 ENCOUNTER — Other Ambulatory Visit: Payer: Self-pay | Admitting: Nurse Practitioner

## 2024-05-20 DIAGNOSIS — E559 Vitamin D deficiency, unspecified: Secondary | ICD-10-CM

## 2024-05-21 NOTE — Telephone Encounter (Signed)
 Does the pt. Need to remain on this. Last Vit D was in the normal range last Nov.

## 2024-05-22 ENCOUNTER — Other Ambulatory Visit: Payer: Self-pay | Admitting: Nurse Practitioner

## 2024-05-22 DIAGNOSIS — E1165 Type 2 diabetes mellitus with hyperglycemia: Secondary | ICD-10-CM

## 2024-05-22 DIAGNOSIS — K2100 Gastro-esophageal reflux disease with esophagitis, without bleeding: Secondary | ICD-10-CM

## 2024-05-22 DIAGNOSIS — E1121 Type 2 diabetes mellitus with diabetic nephropathy: Secondary | ICD-10-CM

## 2024-05-22 DIAGNOSIS — E782 Mixed hyperlipidemia: Secondary | ICD-10-CM

## 2024-05-22 DIAGNOSIS — I10 Essential (primary) hypertension: Secondary | ICD-10-CM

## 2024-05-22 NOTE — Telephone Encounter (Signed)
 Last apt 02/03/24.

## 2024-06-10 DIAGNOSIS — J45998 Other asthma: Secondary | ICD-10-CM | POA: Diagnosis not present

## 2024-06-11 ENCOUNTER — Other Ambulatory Visit: Payer: Self-pay | Admitting: Nurse Practitioner

## 2024-06-12 ENCOUNTER — Encounter: Admitting: Nurse Practitioner

## 2024-06-14 ENCOUNTER — Other Ambulatory Visit: Payer: Self-pay | Admitting: Nurse Practitioner

## 2024-06-14 DIAGNOSIS — E1165 Type 2 diabetes mellitus with hyperglycemia: Secondary | ICD-10-CM

## 2024-06-14 DIAGNOSIS — E1121 Type 2 diabetes mellitus with diabetic nephropathy: Secondary | ICD-10-CM

## 2024-06-14 DIAGNOSIS — K2100 Gastro-esophageal reflux disease with esophagitis, without bleeding: Secondary | ICD-10-CM

## 2024-06-14 DIAGNOSIS — B9689 Other specified bacterial agents as the cause of diseases classified elsewhere: Secondary | ICD-10-CM

## 2024-06-14 DIAGNOSIS — E782 Mixed hyperlipidemia: Secondary | ICD-10-CM

## 2024-06-14 DIAGNOSIS — J4541 Moderate persistent asthma with (acute) exacerbation: Secondary | ICD-10-CM

## 2024-06-14 DIAGNOSIS — I10 Essential (primary) hypertension: Secondary | ICD-10-CM

## 2024-06-27 ENCOUNTER — Ambulatory Visit: Admitting: Pulmonary Disease

## 2024-07-03 NOTE — Progress Notes (Unsigned)
 Lindsay Doing, DNP, AGNP-c [facility]  [address] [place], [place] [address] [phone]  ESTABLISHED PATIENT- Chronic Health and/or Follow-Up Visit on 07/04/2024  Blood pressure (!) 150/92, pulse (!) 104, weight 171 lb (77.6 kg), last menstrual period 11/02/2015.   HPI: History of Present Illness Lindsay Ellis is a 48 year old female with diabetes, hypertension, hyperlipidemia, and asthma who presents for a medication check.  Her blood pressure is significantly elevated today. She recently returned from a trip to [place]  where she got married. She has not been sleeping well and has a busy schedule.  For her diabetes, she is currently taking Jardiance , Mounjaro  (5mg ) and metformin , with blood sugars around 120 mg/dL as of yesterday. She monitors her blood sugars at home. She is on a 5 mg dose of Mounjaro  and is considering increasing to 7.5 mg. No nausea, vomiting, or diarrhea with her current regimen.  Regarding hypertension, she is taking telmisartan  and hydrochlorothiazide . Her blood pressure was 150/92 mmHg during the visit on the second evaluation. She does not have a blood pressure cuff at home to monitor her levels regularly.  Her asthma is stable, and she uses Breztri  daily, which she finds effective. She has not used a CPAP for two years. No recent shortness of breath beyond her usual, and she was able to walk extensively during her trip to [place] . She had a recent CT that showed emphysema and smoking related bronchiolitis present and she has questions about this today. She also had a 3mm nodule noted. She is seeing pulmonology in the near future. She has a history of smoking and is currently still smoking, although she had reduced her intake before her trip to [place] , but has since gone back to her baseline. .   She takes amitriptyline  for sleep and pain management, which she has been on for a long time. She is unsure if it is still effective but  notes that she is able to sleep. She is also on gabapentin , meloxicam , and meclizine , which she takes daily.  All ROS negative with exception of what is listed above.   PHYSICAL EXAM Physical Exam Vitals and nursing note reviewed.  Constitutional:      General: She is not in acute distress.    Appearance: Normal appearance. She is not ill-appearing.  HENT:     Head: Normocephalic.  Eyes:     Pupils: Pupils are equal, round, and reactive to light.  Cardiovascular:     Rate and Rhythm: Normal rate and regular rhythm.     Pulses: Normal pulses.     Heart sounds: Normal heart sounds.  Pulmonary:     Effort: Pulmonary effort is normal.     Breath sounds: Normal breath sounds.  Abdominal:     General: Bowel sounds are normal. There is no distension.     Palpations: Abdomen is soft.     Tenderness: There is no abdominal tenderness. There is no guarding.  Musculoskeletal:        General: Normal range of motion.     Cervical back: Normal range of motion. No tenderness.     Right lower leg: No edema.     Left lower leg: No edema.  Lymphadenopathy:     Cervical: No cervical adenopathy.  Skin:    General: Skin is warm.     Capillary Refill: Capillary refill takes less than 2 seconds.  Neurological:     General: No focal deficit present.     Mental  Status: She is alert and oriented to person, place, and time.     Sensory: No sensory deficit.     Motor: No weakness.  Psychiatric:        Mood and Affect: Mood normal.        Behavior: Behavior normal.     PLAN Problem List Items Addressed This Visit     Tachycardia-bradycardia (HCC)   No concerns present at this time. No changes.       Relevant Medications   atorvastatin  (LIPITOR) 20 MG tablet   telmisartan -hydrochlorothiazide  (MICARDIS  HCT) 40-12.5 MG tablet   OSA (obstructive sleep apnea)   Not currently managed with CPAP. No concerns at this time. Will follow closely with pulmonology.       Relevant Medications    tirzepatide  (MOUNJARO ) 7.5 MG/0.5ML Pen   Diabetic nephropathy associated with type 2 diabetes mellitus (HCC)   Evaluation of kidney function today. No alarm symptoms present. Tight blood pressure control strongly recommended to reduce the damage to the kidneys.       Relevant Medications   amitriptyline  (ELAVIL ) 100 MG tablet   atorvastatin  (LIPITOR) 20 MG tablet   JARDIANCE  25 MG TABS tablet   RABEprazole  (ACIPHEX ) 20 MG tablet   tirzepatide  (MOUNJARO ) 7.5 MG/0.5ML Pen   meclizine  (ANTIVERT ) 25 MG tablet   meloxicam  (MOBIC ) 15 MG tablet   telmisartan -hydrochlorothiazide  (MICARDIS  HCT) 40-12.5 MG tablet   Other Relevant Orders   CBC with Differential/Platelet   CMP14+EGFR   Hemoglobin A1c   Hypertension associated with type 2 diabetes mellitus (HCC)   Blood pressure is elevated at 150/92 mmHg, unclear if this is due to recent travel stress and lack of sleep. She traveled all night long returning from [place]  and has not had any sleep. Currently on telmisartan  and hydrochlorothiazide . - Combine telmisartan  and hydrochlorothiazide  into one pill - Schedule follow-up blood pressure check with nurse in a couple of weeks - If blood pressure remains >130/80, plan to double the dose of telmesartan-hctz      Relevant Medications   amitriptyline  (ELAVIL ) 100 MG tablet   atorvastatin  (LIPITOR) 20 MG tablet   JARDIANCE  25 MG TABS tablet   RABEprazole  (ACIPHEX ) 20 MG tablet   tirzepatide  (MOUNJARO ) 7.5 MG/0.5ML Pen   meclizine  (ANTIVERT ) 25 MG tablet   meloxicam  (MOBIC ) 15 MG tablet   telmisartan -hydrochlorothiazide  (MICARDIS  HCT) 40-12.5 MG tablet   Gastroesophageal reflux disease with esophagitis without hemorrhage   GERD symptoms well-controlled with Aciphex . - Continue Aciphex       Relevant Medications   amitriptyline  (ELAVIL ) 100 MG tablet   atorvastatin  (LIPITOR) 20 MG tablet   JARDIANCE  25 MG TABS tablet   RABEprazole  (ACIPHEX ) 20 MG tablet   meclizine  (ANTIVERT ) 25 MG tablet    meloxicam  (MOBIC ) 15 MG tablet   Type 2 diabetes mellitus with hyperglycemia, without long-term current use of insulin (HCC) - Primary   Blood sugars are well-controlled with recent readings around 120 mg/dL. Currently on Jardiance  Mounjaro , and metformin . Discussion to increase Mounjaro  dosage to 7.5mg . This may allow us  to taper off of the metformin . I will wait to see what her A1c is and make this decision based on those results.  - Continue Jardiance . - Increase Mounjaro  to 7.5mg  - Consider stopping metformin  if blood sugars remain stable- will let you know - Monitor blood sugars at home - Follow healthy diet with low carbohydrates and exercise every day      Relevant Medications   amitriptyline  (ELAVIL ) 100 MG tablet   atorvastatin  (  LIPITOR) 20 MG tablet   JARDIANCE  25 MG TABS tablet   RABEprazole  (ACIPHEX ) 20 MG tablet   tirzepatide  (MOUNJARO ) 7.5 MG/0.5ML Pen   meclizine  (ANTIVERT ) 25 MG tablet   meloxicam  (MOBIC ) 15 MG tablet   telmisartan -hydrochlorothiazide  (MICARDIS  HCT) 40-12.5 MG tablet   Other Relevant Orders   CBC with Differential/Platelet   CMP14+EGFR   Hemoglobin A1c   Lumbar radiculopathy   Chronic pain managed with amitriptyline  for sleep and pain relief. Baclofen  preferred over cyclobenzaprine  for muscle relaxation. Additional treatment includes gabapentin  and meloxicam .  - Continue current medications.  - Consider stopping meloxicam  based on kidney function.       Relevant Medications   amitriptyline  (ELAVIL ) 100 MG tablet   baclofen  (LIORESAL ) 10 MG tablet   Hyperlipidemia associated with type 2 diabetes mellitus (HCC)   Currently managed with statin therapy. No symptoms at this time. Will monitor      Relevant Medications   atorvastatin  (LIPITOR) 20 MG tablet   JARDIANCE  25 MG TABS tablet   tirzepatide  (MOUNJARO ) 7.5 MG/0.5ML Pen   meclizine  (ANTIVERT ) 25 MG tablet   meloxicam  (MOBIC ) 15 MG tablet   telmisartan -hydrochlorothiazide  (MICARDIS  HCT)  40-12.5 MG tablet   Other Relevant Orders   CBC with Differential/Platelet   CMP14+EGFR   Hemoglobin A1c   Morbid obesity (HCC)   Weight loss noted during recent travel. Currently on Mounjaro  5 mg, considering increasing to 7.5 mg. No adverse effects reported.  - Increase Mounjaro  to 7.5 mg - Monitor your diet closely and get exercise every day      Relevant Medications   JARDIANCE  25 MG TABS tablet   tirzepatide  (MOUNJARO ) 7.5 MG/0.5ML Pen   meclizine  (ANTIVERT ) 25 MG tablet   meloxicam  (MOBIC ) 15 MG tablet   Other Relevant Orders   CBC with Differential/Platelet   CMP14+EGFR   Hemoglobin A1c   Asthma-COPD overlap syndrome (HCC)   Recent CT showing early emphysema. Patient currently asthmatic and daily smoker. At this time treatment with Breztri  and albuterol  have been effective. Will change albuterol  to Airsupra  to meet current guidelines. Recommend close follow-up with pulmonology and smoking cessation for prevention of worsening of the condition.       Relevant Medications   montelukast  (SINGULAIR ) 10 MG tablet   Albuterol -Budesonide  (AIRSUPRA ) 90-80 MCG/ACT AERO   Pulmonary emphysema (HCC)   Asthma is well-controlled with Breztri . CT shows early emphysema and mild smoking-related bronchiolitis. Smoking cessation is important to prevent progression. Strongly encourage smoking cessation. We can consider medication or other resources to aid when she is ready.  - Continue Breztri  - Change albuterol  only inhaler to AirSupra  to match the current guidelines.  - Follow up with pulmonology for further evaluation of emphysema and bronchiolitis - Encourage smoking cessation      Relevant Medications   montelukast  (SINGULAIR ) 10 MG tablet   Albuterol -Budesonide  (AIRSUPRA ) 90-80 MCG/ACT AERO   Vitamin D  deficiency   Relevant Orders   Vitamin D , 25-hydroxy   Encounter for annual physical exam   Other Visit Diagnoses       Need for influenza vaccination       Relevant Orders    Flu vaccine trivalent PF, 6mos and older(Flulaval,Afluria,Fluarix,Fluzone) (Completed)     Mixed hyperlipidemia       Relevant Medications   atorvastatin  (LIPITOR) 20 MG tablet   meclizine  (ANTIVERT ) 25 MG tablet   meloxicam  (MOBIC ) 15 MG tablet   telmisartan -hydrochlorothiazide  (MICARDIS  HCT) 40-12.5 MG tablet     Moderate persistent asthma with acute exacerbation  Relevant Medications   montelukast  (SINGULAIR ) 10 MG tablet   Albuterol -Budesonide  (AIRSUPRA ) 90-80 MCG/ACT AERO          Return in about 4 months (around 11/03/2024) for 2 weeks BP check- nurse only , 4 months follow-up.  SaraBeth Early, DNP, AGNP-c Time: 42 minutes, >50% spent counseling, care coordination, chart review, and documentation.  SABRAtime

## 2024-07-04 ENCOUNTER — Ambulatory Visit (INDEPENDENT_AMBULATORY_CARE_PROVIDER_SITE_OTHER): Admitting: Nurse Practitioner

## 2024-07-04 ENCOUNTER — Encounter: Payer: Self-pay | Admitting: Nurse Practitioner

## 2024-07-04 VITALS — BP 150/92 | HR 104 | Wt 171.0 lb

## 2024-07-04 DIAGNOSIS — E1159 Type 2 diabetes mellitus with other circulatory complications: Secondary | ICD-10-CM | POA: Diagnosis not present

## 2024-07-04 DIAGNOSIS — E1121 Type 2 diabetes mellitus with diabetic nephropathy: Secondary | ICD-10-CM

## 2024-07-04 DIAGNOSIS — J4541 Moderate persistent asthma with (acute) exacerbation: Secondary | ICD-10-CM | POA: Diagnosis not present

## 2024-07-04 DIAGNOSIS — Z23 Encounter for immunization: Secondary | ICD-10-CM | POA: Diagnosis not present

## 2024-07-04 DIAGNOSIS — G4733 Obstructive sleep apnea (adult) (pediatric): Secondary | ICD-10-CM | POA: Diagnosis not present

## 2024-07-04 DIAGNOSIS — E1165 Type 2 diabetes mellitus with hyperglycemia: Secondary | ICD-10-CM | POA: Diagnosis not present

## 2024-07-04 DIAGNOSIS — E66811 Obesity, class 1: Secondary | ICD-10-CM

## 2024-07-04 DIAGNOSIS — E1169 Type 2 diabetes mellitus with other specified complication: Secondary | ICD-10-CM | POA: Diagnosis not present

## 2024-07-04 DIAGNOSIS — E559 Vitamin D deficiency, unspecified: Secondary | ICD-10-CM | POA: Diagnosis not present

## 2024-07-04 DIAGNOSIS — I10 Essential (primary) hypertension: Secondary | ICD-10-CM

## 2024-07-04 DIAGNOSIS — J439 Emphysema, unspecified: Secondary | ICD-10-CM | POA: Insufficient documentation

## 2024-07-04 DIAGNOSIS — M5416 Radiculopathy, lumbar region: Secondary | ICD-10-CM

## 2024-07-04 DIAGNOSIS — K2100 Gastro-esophageal reflux disease with esophagitis, without bleeding: Secondary | ICD-10-CM

## 2024-07-04 DIAGNOSIS — I152 Hypertension secondary to endocrine disorders: Secondary | ICD-10-CM

## 2024-07-04 DIAGNOSIS — E782 Mixed hyperlipidemia: Secondary | ICD-10-CM

## 2024-07-04 DIAGNOSIS — J4489 Other specified chronic obstructive pulmonary disease: Secondary | ICD-10-CM

## 2024-07-04 DIAGNOSIS — M150 Primary generalized (osteo)arthritis: Secondary | ICD-10-CM | POA: Insufficient documentation

## 2024-07-04 DIAGNOSIS — Z0000 Encounter for general adult medical examination without abnormal findings: Secondary | ICD-10-CM

## 2024-07-04 DIAGNOSIS — I495 Sick sinus syndrome: Secondary | ICD-10-CM

## 2024-07-04 MED ORDER — RABEPRAZOLE SODIUM 20 MG PO TBEC: 20.0000 mg | DELAYED_RELEASE_TABLET | Freq: Every day | ORAL | 3 refills | Status: DC

## 2024-07-04 MED ORDER — AMITRIPTYLINE HCL 100 MG PO TABS: 100.0000 mg | ORAL_TABLET | Freq: Every day | ORAL | 3 refills | Status: DC

## 2024-07-04 MED ORDER — ATORVASTATIN CALCIUM 20 MG PO TABS: 20.0000 mg | ORAL_TABLET | Freq: Every day | ORAL | 3 refills | Status: DC

## 2024-07-04 MED ORDER — JARDIANCE 25 MG PO TABS: 25.0000 mg | ORAL_TABLET | Freq: Every day | ORAL | 3 refills | Status: DC

## 2024-07-04 MED ORDER — TELMISARTAN-HCTZ 40-12.5 MG PO TABS: 1.0000 | ORAL_TABLET | Freq: Every day | ORAL | 3 refills | Status: DC

## 2024-07-04 MED ORDER — AIRSUPRA 90-80 MCG/ACT IN AERO: 2.0000 | INHALATION_SPRAY | Freq: Four times a day (QID) | RESPIRATORY_TRACT | 3 refills | Status: DC | PRN

## 2024-07-04 MED ORDER — BACLOFEN 10 MG PO TABS: 30.0000 mg | ORAL_TABLET | Freq: Three times a day (TID) | ORAL | 3 refills | Status: DC

## 2024-07-04 MED ORDER — MOUNJARO 7.5 MG/0.5ML ~~LOC~~ SOAJ: 7.5000 mg | SUBCUTANEOUS | 1 refills | Status: DC

## 2024-07-04 MED ORDER — MECLIZINE HCL 25 MG PO TABS: 25.0000 mg | ORAL_TABLET | Freq: Three times a day (TID) | ORAL | 3 refills | Status: DC

## 2024-07-04 MED ORDER — MONTELUKAST SODIUM 10 MG PO TABS: 10.0000 mg | ORAL_TABLET | Freq: Every day | ORAL | 3 refills | Status: DC

## 2024-07-04 MED ORDER — MELOXICAM 15 MG PO TABS: 15.0000 mg | ORAL_TABLET | Freq: Every day | ORAL | 3 refills | Status: DC

## 2024-07-04 NOTE — Patient Instructions (Addendum)
 I have sent in refills for all of your medications, except your metformin . If your blood sugars are looking good, we can stop this. If we still need to keep this going I will send this in once we get the lab results back.   I changed the muscle relaxer back to baclofen .   I sent in a new inhaler, called Airsupra . This will take the place of the albuterol  for rescue inhaler. Rinse your mouth after using this.   Chronic Obstructive Pulmonary Disease (Emphysema)  Chronic obstructive pulmonary disease (COPD) is a long-term (chronic) lung problem. When you have COPD, it can feel harder to breathe in or out. The condition may get worse over time. There are things you can do to keep yourself as healthy as possible. What are the causes? Smoking. This is the most common cause. Breathing in fumes, smoke, or chemicals for a long time. Genes that are inherited, which means they are passed down from parent to child. What are the signs or symptoms? Shortness of breath. This may happen all the time. This may get worse when you move your body. This may get worse over time. You may have times when this becomes much worse all of a sudden. These are called flare-ups or exacerbations. A long-term cough, with or without thick mucus. Wheezing. Chest tightness. Feeling tired. Not being able to do activities like you used to do. How is this diagnosed? This condition is diagnosed based on: Your medical history. A physical exam. Lung (pulmonary) function tests. You may have a test that measures the air flow out of the lungs when you breathe out. You may also have tests, including: Chest X-ray. CT scan. Blood tests. How is this treated? This condition may be treated by: Quitting smoking, if you smoke. Using oxygen. Taking medicines. These may include: Inhalers. These have medicines in them that you breathe in. Daily inhalers. These help to prevent symptoms from happening. They are usually taken every  day to prevent COPD flare-ups. Quick relief inhalers. These act fast to relieve symptoms. They are used only when needed and provide short-term relief. Other medicines that you breathe in or swallow. These may be used to open the airways, thin mucus, or treat infections. Breathing exercises to help you control or catch your breath. A mucus clearing device, if you have a lot of thick mucus. Pulmonary rehab. A place where you will learn about your condition and the best ways for you to manage it. Surgery. Follow these instructions at home: Medicines Take your medicines as told by your health care provider. Talk to your provider before taking any cough or allergy medicines. You may need to avoid medicines that cause your lungs to be dry. Lifestyle Several times a day, wash your hands with soap and water for at least 20 seconds. If you cannot use soap and water, use hand sanitizer. This may help keep you from getting an infection. Avoid being around crowds or people who are sick. Do not smoke or use any products that contain nicotine or tobacco. If you need help quitting, ask your provider. Stay active. Learn how to pace your activity during the day. Learn how to breathe to control your stress and catch your breath. Drink enough fluid to keep your pee (urine) pale yellow, unless you have been told not to. Eat healthy foods. Eat smaller meals more often. Get enough sleep. Most adults need 7 or more hours per night. General instructions Make a COPD action plan with your  provider. This helps you to know what to do if you feel worse than usual. Make sure you get all the shots, also called vaccines, that your provider recommends. Ask your provider about a flu shot and a pneumonia shot. If you need home oxygen therapy, ask your provider how often to check your oxygen level with a device called an oximeter. Keep all follow-up visits to review your COPD action plan. Your provider will want to check on  your condition often to keep you healthy and out of the hospital. Contact a health care provider if: You are coughing up more mucus than usual. There is a change in the color or thickness of the mucus. It is harder to breathe than usual or you are short of breath while you are resting. You need to use your quick relief inhaler more often. You have trouble doing your normal activities such as getting dressed or walking in the house. Your skin color or fingernails turn blue. You have a fever or chills. Get help right away if: You are short of breath and cannot: Talk in full sentences. Do normal activities. You have chest pain. You feel confused. These symptoms may be an emergency. Call 911 right away. Do not wait to see if the symptoms will go away. Do not drive yourself to the hospital. This information is not intended to replace advice given to you by your health care provider. Make sure you discuss any questions you have with your health care provider. Document Revised: 07/21/2023 Document Reviewed: 01/03/2023 Elsevier Patient Education  2024 [employer].

## 2024-07-04 NOTE — Assessment & Plan Note (Signed)
 Asthma is well-controlled with Breztri . CT shows Lindsay Ellis emphysema and mild smoking-related bronchiolitis. Smoking cessation is important to prevent progression. Strongly encourage smoking cessation. We can consider medication or other resources to aid when she is ready.  - Continue Breztri  - Change albuterol  only inhaler to AirSupra  to match the current guidelines.  - Follow up with pulmonology for further evaluation of emphysema and bronchiolitis - Encourage smoking cessation

## 2024-07-04 NOTE — Assessment & Plan Note (Signed)
 Chronic pain managed with amitriptyline  for sleep and pain relief. Baclofen  preferred over cyclobenzaprine  for muscle relaxation. Additional treatment includes gabapentin  and meloxicam .  - Continue current medications.  - Consider stopping meloxicam  based on kidney function.

## 2024-07-04 NOTE — Assessment & Plan Note (Signed)
 Recent CT showing Lindsay Ellis emphysema. Patient currently asthmatic and daily smoker. At this time treatment with Breztri  and albuterol  have been effective. Will change albuterol  to Airsupra  to meet current guidelines. Recommend close follow-up with pulmonology and smoking cessation for prevention of worsening of the condition.

## 2024-07-04 NOTE — Assessment & Plan Note (Signed)
 Currently managed with statin therapy. No symptoms at this time. Will monitor

## 2024-07-04 NOTE — Assessment & Plan Note (Signed)
 Blood sugars are well-controlled with recent readings around 120 mg/dL. Currently on Jardiance  Mounjaro , and metformin . Discussion to increase Mounjaro  dosage to 7.5mg . This may allow us  to taper off of the metformin . I will wait to see what her A1c is and make this decision based on those results.  - Continue Jardiance . - Increase Mounjaro  to 7.5mg  - Consider stopping metformin  if blood sugars remain stable- will let you know - Monitor blood sugars at home - Follow healthy diet with low carbohydrates and exercise every day

## 2024-07-04 NOTE — Assessment & Plan Note (Signed)
 Evaluation of kidney function today. No alarm symptoms present. Tight blood pressure control strongly recommended to reduce the damage to the kidneys.

## 2024-07-04 NOTE — Assessment & Plan Note (Signed)
 Blood pressure is elevated at 150/92 mmHg, unclear if this is due to recent travel stress and lack of sleep. She traveled all night long returning from [place]  and has not had any sleep. Currently on telmisartan  and hydrochlorothiazide . - Combine telmisartan  and hydrochlorothiazide  into one pill - Schedule follow-up blood pressure check with nurse in a couple of weeks - If blood pressure remains >130/80, plan to double the dose of telmesartan-hctz

## 2024-07-04 NOTE — Assessment & Plan Note (Signed)
 No concerns present at this time. No changes.

## 2024-07-04 NOTE — Assessment & Plan Note (Signed)
 Not currently managed with CPAP. No concerns at this time. Will follow closely with pulmonology.

## 2024-07-04 NOTE — Assessment & Plan Note (Signed)
 Weight loss noted during recent travel. Currently on Mounjaro  5 mg, considering increasing to 7.5 mg. No adverse effects reported.  - Increase Mounjaro  to 7.5 mg - Monitor your diet closely and get exercise every day

## 2024-07-04 NOTE — Assessment & Plan Note (Signed)
 GERD symptoms well-controlled with Aciphex . - Continue Aciphex 

## 2024-07-05 LAB — CBC WITH DIFFERENTIAL/PLATELET
Basophils Absolute: 0 x10E3/uL (ref 0.0–0.2)
Basos: 1 %
EOS (ABSOLUTE): 0.2 x10E3/uL (ref 0.0–0.4)
Eos: 3 %
Hematocrit: 45.1 % (ref 34.0–46.6)
Hemoglobin: 14.3 g/dL (ref 11.1–15.9)
Immature Grans (Abs): 0 x10E3/uL (ref 0.0–0.1)
Immature Granulocytes: 0 %
Lymphocytes Absolute: 2.4 x10E3/uL (ref 0.7–3.1)
Lymphs: 30 %
MCH: 27 pg (ref 26.6–33.0)
MCHC: 31.7 g/dL (ref 31.5–35.7)
MCV: 85 fL (ref 79–97)
Monocytes Absolute: 0.4 x10E3/uL (ref 0.1–0.9)
Monocytes: 6 %
Neutrophils Absolute: 4.9 x10E3/uL (ref 1.4–7.0)
Neutrophils: 60 %
Platelets: 263 x10E3/uL (ref 150–450)
RBC: 5.3 x10E6/uL — ABNORMAL HIGH (ref 3.77–5.28)
RDW: 16.4 % — ABNORMAL HIGH (ref 11.7–15.4)
WBC: 8.1 x10E3/uL (ref 3.4–10.8)

## 2024-07-05 LAB — CMP14+EGFR
ALT: 20 IU/L (ref 0–32)
AST: 16 IU/L (ref 0–40)
Albumin: 4.2 g/dL (ref 3.9–4.9)
Alkaline Phosphatase: 100 IU/L (ref 44–121)
BUN/Creatinine Ratio: 13 (ref 9–23)
BUN: 10 mg/dL (ref 6–24)
Bilirubin Total: 0.2 mg/dL (ref 0.0–1.2)
CO2: 23 mmol/L (ref 20–29)
Calcium: 10.1 mg/dL (ref 8.7–10.2)
Chloride: 104 mmol/L (ref 96–106)
Creatinine, Ser: 0.8 mg/dL (ref 0.57–1.00)
Globulin, Total: 2.3 g/dL (ref 1.5–4.5)
Glucose: 83 mg/dL (ref 70–99)
Potassium: 3.8 mmol/L (ref 3.5–5.2)
Sodium: 143 mmol/L (ref 134–144)
Total Protein: 6.5 g/dL (ref 6.0–8.5)
eGFR: 91 mL/min/1.73 (ref >59)

## 2024-07-05 LAB — VITAMIN D 25 HYDROXY (VIT D DEFICIENCY, FRACTURES): Vit D, 25-Hydroxy: 72.1 ng/mL (ref 30.0–100.0)

## 2024-07-05 LAB — HEMOGLOBIN A1C
Est. average glucose Bld gHb Est-mCnc: 120 mg/dL
Hgb A1c MFr Bld: 5.8 % — ABNORMAL HIGH (ref 4.8–5.6)

## 2024-07-11 ENCOUNTER — Ambulatory Visit: Payer: Self-pay | Admitting: Nurse Practitioner

## 2024-07-11 DIAGNOSIS — J45998 Other asthma: Secondary | ICD-10-CM | POA: Diagnosis not present

## 2024-07-18 ENCOUNTER — Other Ambulatory Visit

## 2024-07-18 ENCOUNTER — Telehealth: Payer: Self-pay

## 2024-07-18 NOTE — Telephone Encounter (Signed)
 Pt. Came in today for a BP check it was 144/88.

## 2024-07-21 ENCOUNTER — Other Ambulatory Visit: Payer: Self-pay | Admitting: Nurse Practitioner

## 2024-07-21 DIAGNOSIS — G4733 Obstructive sleep apnea (adult) (pediatric): Secondary | ICD-10-CM

## 2024-07-21 DIAGNOSIS — E66811 Other obesity due to excess calories: Secondary | ICD-10-CM

## 2024-07-21 DIAGNOSIS — E1165 Type 2 diabetes mellitus with hyperglycemia: Secondary | ICD-10-CM

## 2024-07-21 DIAGNOSIS — E1121 Type 2 diabetes mellitus with diabetic nephropathy: Secondary | ICD-10-CM

## 2024-07-21 DIAGNOSIS — E1169 Type 2 diabetes mellitus with other specified complication: Secondary | ICD-10-CM

## 2024-07-21 DIAGNOSIS — I10 Essential (primary) hypertension: Secondary | ICD-10-CM

## 2024-07-24 NOTE — Telephone Encounter (Signed)
 If she was taking the telmisartan -hydrochlorothiazide  at the time of the BP check, please call and ask her to double her dose (2 tablets by mouth every morning). We can recheck in 2 weeks and see if this helps.

## 2024-07-30 ENCOUNTER — Ambulatory Visit: Admitting: Student in an Organized Health Care Education/Training Program

## 2024-08-01 ENCOUNTER — Ambulatory Visit (INDEPENDENT_AMBULATORY_CARE_PROVIDER_SITE_OTHER)

## 2024-08-01 ENCOUNTER — Ambulatory Visit: Admitting: Nurse Practitioner

## 2024-08-01 VITALS — BP 118/80 | HR 89 | Temp 98.4°F | Ht 63.0 in | Wt 165.8 lb

## 2024-08-01 DIAGNOSIS — J4489 Other specified chronic obstructive pulmonary disease: Secondary | ICD-10-CM

## 2024-08-01 DIAGNOSIS — F17210 Nicotine dependence, cigarettes, uncomplicated: Secondary | ICD-10-CM | POA: Diagnosis not present

## 2024-08-01 DIAGNOSIS — J439 Emphysema, unspecified: Secondary | ICD-10-CM | POA: Diagnosis not present

## 2024-08-01 DIAGNOSIS — R911 Solitary pulmonary nodule: Secondary | ICD-10-CM | POA: Diagnosis not present

## 2024-08-01 DIAGNOSIS — J4540 Moderate persistent asthma, uncomplicated: Secondary | ICD-10-CM

## 2024-08-01 DIAGNOSIS — Z720 Tobacco use: Secondary | ICD-10-CM

## 2024-08-01 MED ORDER — VARENICLINE TARTRATE (STARTER) 0.5 MG X 11 & 1 MG X 42 PO TBPK: ORAL_TABLET | ORAL | 0 refills | Status: AC

## 2024-08-01 NOTE — Patient Instructions (Signed)
 It was nice meeting you today in the clinic.  Your CT scan showed some mucus in the airways and some emphysema in the upper lobes.  This is likely related to your asthma as well as some mild evidence of emphysema from smoking.  You are already on really good inhalers with the Breztri  and Airsupra .  Please continue using those.  Please stop smoking cigarettes.  We have given you a prescription for Chantix to help you quit.Call 1-800-quit-NOW to get free nicotine replacement and counseling from the state of [place]     I will see you back in 3 months

## 2024-08-01 NOTE — Progress Notes (Signed)
 You were   Subjective:   PATIENT ID: Lindsay Ellis GENDER: female DOB: 1975/11/06, MRN: [id number]   HPI 48 year old female with a past medical history of childhood asthma since the age of 19, smoker 1 pack/day currently smokes 5 cigarettes a day, 30-pack-year smoking history.  Patient is presenting to the clinic as a referral for shortness of breath and CT findings showing mucus in the airways with thickening of the bronchial walls as well as mild emphysema in her upper lobes. She is currently on Breztri  and Airsupra .  Her last exacerbation where she was admitted to the hospital was in 2018.  She states that at that time she was intubated for a day and then was on CPAP for 2 days afterwards.  She denies any fevers or chills or night sweats.  She denies any weight loss or loss of appetite.  She is a stay-at-home.  She has 1 dog at home.  She does not report any allergies.  She has no prior exposure to environmental inhalational toxins.  She has a positive family history where her mother died of lung cancer at a very young age.  She was not a smoker.  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   Sickle cell trait    Sleep apnea    pt states she lost 100 lbs and no longer needs a CPAP   Tuberculosis    positive test TB, treated in 2003   Vertigo      Family History  Problem Relation Age of Onset   Lung cancer Mother    Breast cancer Neg Hx    BRCA 1/2 Neg Hx      Social History   Socioeconomic History   Marital status: Single    Spouse name: Not on file   Number of children: 3   Years of education: Not on file   Highest education level: Not on file  Occupational History   Not on  file  Tobacco Use   Smoking status: Every Day    Current packs/day: 0.25    Types: Cigarettes   Smokeless tobacco: Never   Tobacco comments:    Smoking 5 cigarettes per day.  Trying to quit.  Not using anything to try to quit.  08/01/2024 hfb  Vaping Use   Vaping status: Never Used  Substance and Sexual Activity   Alcohol use: Not Currently   Drug use: Never   Sexual activity: Not on file  Other Topics Concern   Not on file  Social History Narrative   Not on file   Social Drivers of Health   Financial Resource Strain: Low Risk  (03/20/2024)   Overall Financial Resource Strain (CARDIA)    Difficulty of Paying Living Expenses: Not hard at all  Food Insecurity: No Food Insecurity (03/20/2024)   Hunger Vital Sign    Worried About Running Out of Food in the Last Year: Never true    Ran Out of Food in the Last Year: Never true  Transportation Needs: No Transportation Needs (03/20/2024)   PRAPARE - Administrator, Civil Service (Medical): No    Lack of Transportation (Non-Medical): No  Physical Activity: Insufficiently Active (  03/20/2024)   Exercise Vital Sign    Days of Exercise per Week: 1 day    Minutes of Exercise per Session: 120 min  Stress: Stress Concern Present (03/20/2024)   [employer] of Occupational Health - Occupational Stress Questionnaire    Feeling of Stress : To some extent  Social Connections: Moderately Isolated (03/20/2024)   Social Connection and Isolation Panel    Frequency of Communication with Friends and Family: More than three times a week    Frequency of Social Gatherings with Friends and Family: Never    Attends Religious Services: Never    Database administrator or Organizations: No    Attends Banker Meetings: Never    Marital Status: Living with partner  Intimate Partner Violence: Not At Risk (03/20/2024)   Humiliation, Afraid, Rape, and Kick questionnaire    Fear of Current or Ex-Partner: No    Emotionally Abused: No     Physically Abused: No    Sexually Abused: No     Allergies  Allergen Reactions   Morphine And Codeine Hives and Itching     Outpatient Medications Prior to Visit  Medication Sig Dispense Refill   Accu-Chek Softclix Lancets lancets Use as instructed 200 each 1   albuterol  (ACCUNEB ) 1.25 MG/3ML nebulizer solution Take 3 mLs (1.25 mg total) by nebulization every 6 (six) hours as needed for wheezing. 75 mL 3   Albuterol -Budesonide  (AIRSUPRA ) 90-80 MCG/ACT AERO Inhale 2 Inhalations into the lungs every 6 (six) hours as needed. For emergency use, instead of albuterol  alone. 17.7 g 3   amitriptyline  (ELAVIL ) 100 MG tablet Take 1 tablet (100 mg total) by mouth at bedtime. 100 tablet 3   atorvastatin  (LIPITOR) 20 MG tablet Take 1 tablet (20 mg total) by mouth daily. 100 tablet 3   baclofen  (LIORESAL ) 10 MG tablet Take 3 tablets (30 mg total) by mouth 3 (three) times daily. 900 tablet 3   Blood Glucose Monitoring Suppl (ACCU-CHEK GUIDE) w/Device KIT 1 Device by Does not apply route daily. Accuchek guide kit 1 kit 0   Blood Glucose Monitoring Suppl (TRUE METRIX AIR GLUCOSE METER) DEVI 1 Device by Does not apply route daily. 1 each 0   Blood Glucose Monitoring Suppl DEVI 1 each by Does not apply route in the morning, at noon, and at bedtime. May substitute to any manufacturer covered by patient's insurance. 1 each 0   budesonide  (PULMICORT ) 0.25 MG/2ML nebulizer solution USE 1 VIAL VIA NEBULIZER DAILY  USE WITH YUPELRI  AND IPRATROPIUM IN PLACE OF DAILY INHALER FOR  FLAIRS 120 mL 5   budesonide -glycopyrrolate-formoterol (BREZTRI  AEROSPHERE) 160-9-4.8 MCG/ACT AERO inhaler USE 2 INHALATIONS BY MOUTH TWICE DAILY 32.1 g 1   EPINEPHrine  0.3 mg/0.3 mL IJ SOAJ injection Inject 0.3 mg into the muscle as needed for anaphylaxis. 1 each 0   gabapentin  (NEURONTIN ) 300 MG capsule TAKE 5 CAPSULES BY MOUTH 3 TIMES DAILY 1500 capsule 3   glucose blood (ACCU-CHEK GUIDE TEST) test strip 1 each by Other route daily. 200  strip 1   ipratropium (ATROVENT ) 0.02 % nebulizer solution Take 2.5 mLs (0.5 mg total) by nebulization every 6 (six) hours as needed for wheezing or shortness of breath. Use once daily with budesonide  and Yupelri  in place of Daily inhaler during flairs. 75 mL 12   JARDIANCE  25 MG TABS tablet Take 1 tablet (25 mg total) by mouth daily. 100 tablet 3   Lancet Device MISC For monitoring blood sugar up to 4 times a day.  May substitute to any manufacturer covered by patient's insurance. 200 each 99   Lancets Misc. MISC For monitoring blood sugar up to 4 times a day. May substitute to any manufacturer covered by patient's insurance. 200 each 99   levocetirizine (XYZAL ) 5 MG tablet Take 1 tablet (5 mg total) by mouth at bedtime as needed for allergies. 90 tablet 1   meclizine  (ANTIVERT ) 25 MG tablet Take 1 tablet (25 mg total) by mouth 3 (three) times daily. 300 tablet 3   meloxicam  (MOBIC ) 15 MG tablet Take 1 tablet (15 mg total) by mouth daily. 100 tablet 3   mometasone  (ELOCON ) 0.1 % cream Apply a thin layer to the fingers with rash before bed for 1 week. Repeat as necessary for rash recurrence. 45 g 1   montelukast  (SINGULAIR ) 10 MG tablet Take 1 tablet (10 mg total) by mouth at bedtime. 100 tablet 3   Multiple Vitamin (MULTIVITAMIN) tablet Take 1 tablet by mouth daily.     RABEprazole  (ACIPHEX ) 20 MG tablet Take 1 tablet (20 mg total) by mouth daily. 100 tablet 3   revefenacin  (YUPELRI ) 175 MCG/3ML nebulizer solution Take 3 mLs (175 mcg total) by nebulization daily. Use with ipratropium and budesonide . 270 mL 1   telmisartan -hydrochlorothiazide  (MICARDIS  HCT) 40-12.5 MG tablet Take 1 tablet by mouth daily. 100 tablet 3   tirzepatide  (MOUNJARO ) 7.5 MG/0.5ML Pen Inject 7.5 mg into the skin once a week. 6 mL 1   Vitamin D , Ergocalciferol , (DRISDOL ) 1.25 MG (50000 UNIT) CAPS capsule TAKE 1 CAPSULE BY MOUTH EVERY 7  DAYS 15 capsule 2   No facility-administered medications prior to visit.    ROS Reviewed  all systems and reported negative except as above     Objective:   Vitals:   08/01/24 0839  BP: 118/80  Pulse: 89  Temp: 98.4 F (36.9 C)  TempSrc: Oral  SpO2: 100%  Weight: 165 lb 12.8 oz (75.2 kg)  Height: 5' 3 (1.6 m)    Physical Exam General: Middle-aged female not in acute distress Chest: Clear to auscultation bilaterally Heart: Regular rate and rhythm, normal S1, S2 Extremities: Warm, well-perfused    CBC    Component Value Date/Time   WBC 8.1 07/04/2024 0953   WBC 10.0 11/18/2023 0926   RBC 5.30 (H) 07/04/2024 0953   RBC 4.90 11/18/2023 0926   HGB 14.3 07/04/2024 0953   HCT 45.1 07/04/2024 0953   PLT 263 07/04/2024 0953   MCV 85 07/04/2024 0953   MCH 27.0 07/04/2024 0953   MCH 28.0 11/18/2023 0926   MCHC 31.7 07/04/2024 0953   MCHC 33.2 11/18/2023 0926   RDW 16.4 (H) 07/04/2024 0953   LYMPHSABS 2.4 07/04/2024 0953   EOSABS 0.2 07/04/2024 0953   BASOSABS 0.0 07/04/2024 0953     Chest imaging: I reviewed her CT chest performed on 04/15/2024.  Mild bronchial wall thickening, mild mucous in the airways, evidence of upper lobe mild emphysema.  Small 3 mm nodule noted.   PFT: No PFTs on file     Assessment & Plan:   Assessment & Plan Moderate persistent asthma without complication Patient with history of childhood asthma.  Currently on max inhaler therapy with Breztri  and Airsupra .  Seems to have good control of her symptoms.  Can consider treatment de-escalation in the future based on how she is doing. Orders:   Pulmonary Function Test; Future   Varenicline Tartrate, Starter, (CHANTIX STARTING MONTH PAK) 0.5 MG X 11 & 1 MG X 42 TBPK; Take  0.5mg  for 3 days then take 0.5mg  twice daily for 4 days. Then take 1mg  twice daily after.  COPD with chronic bronchitis and emphysema (HCC) There is also some evidence of emphysema in her upper lobes.  We will plan to get PFTs. Orders:   Pulmonary Function Test; Future   Varenicline Tartrate, Starter, (CHANTIX  STARTING MONTH PAK) 0.5 MG X 11 & 1 MG X 42 TBPK; Take 0.5mg  for 3 days then take 0.5mg  twice daily for 4 days. Then take 1mg  twice daily after.  Lung nodule She has a 3 mm lung nodule on her CT scan.  She does qualify for high risk given her smoking history but this nodule is extremely small and does not warrant further scans in the future.  She will qualify for lung cancer screening in 2 years anyway.  We will put in a request for a CT chest for lung cancer screening on subsequent follow-ups in the future when she qualifies for it at the age of 80.    Tobacco abuse Smoking/Tobacco Cessation Counseling Everlynn Sagun is a current user of tobacco or nicotine products. She is ready to quit at this time. Counseling provided today addressed the risks of continued use and the benefits of cessation. Discussed tobacco/nicotine use history, readiness to quit, and evidence-based treatment options including behavioral strategies, support resources, and pharmacologic therapies. Provided encouragement and educational materials on steps and resources to quit smoking. Patient questions were addressed, and follow-up recommended for continued support. Total time spent on counseling: 4 minutes.        Zola Herter, MD [facility] Office: [phone]

## 2024-08-10 DIAGNOSIS — J45998 Other asthma: Secondary | ICD-10-CM | POA: Diagnosis not present

## 2024-08-27 ENCOUNTER — Other Ambulatory Visit: Payer: Self-pay | Admitting: Nurse Practitioner

## 2024-08-27 DIAGNOSIS — E782 Mixed hyperlipidemia: Secondary | ICD-10-CM

## 2024-08-27 DIAGNOSIS — Z0000 Encounter for general adult medical examination without abnormal findings: Secondary | ICD-10-CM

## 2024-08-27 DIAGNOSIS — J439 Emphysema, unspecified: Secondary | ICD-10-CM

## 2024-08-27 DIAGNOSIS — J4541 Moderate persistent asthma with (acute) exacerbation: Secondary | ICD-10-CM

## 2024-08-27 DIAGNOSIS — I10 Essential (primary) hypertension: Secondary | ICD-10-CM

## 2024-08-27 DIAGNOSIS — E1121 Type 2 diabetes mellitus with diabetic nephropathy: Secondary | ICD-10-CM

## 2024-08-27 DIAGNOSIS — J4551 Severe persistent asthma with (acute) exacerbation: Secondary | ICD-10-CM

## 2024-08-27 DIAGNOSIS — Z9109 Other allergy status, other than to drugs and biological substances: Secondary | ICD-10-CM

## 2024-08-27 DIAGNOSIS — E1165 Type 2 diabetes mellitus with hyperglycemia: Secondary | ICD-10-CM

## 2024-08-27 NOTE — Progress Notes (Signed)
 Lindsay Ellis                                          MRN: [id number]   08/27/2024   The [facility] Quality Team Specialist reviewed this patient medical record for the purposes of chart review for care gap closure. The following were reviewed: abstraction for care gap closure-glycemic status assessment.    [facility] Quality Team

## 2024-08-28 NOTE — Telephone Encounter (Signed)
 I did not see this in her current med list. I did fill all of her other meds.

## 2024-09-03 ENCOUNTER — Other Ambulatory Visit: Payer: Self-pay

## 2024-09-03 ENCOUNTER — Telehealth: Payer: Self-pay

## 2024-09-03 ENCOUNTER — Encounter: Payer: Self-pay | Admitting: Radiology

## 2024-09-03 MED ORDER — VARENICLINE TARTRATE 1 MG PO TABS: 1.0000 mg | ORAL_TABLET | Freq: Two times a day (BID) | ORAL | 0 refills | Status: AC

## 2024-09-03 NOTE — Telephone Encounter (Signed)
 Rx sent in by Dr. Donzetta.  Attempted to call patient.  No answer and unable to leave VM to inform of rx called in.

## 2024-09-03 NOTE — Telephone Encounter (Unsigned)
 Copied from CRM [id number]. Topic: Clinical - Prescription Issue >> Sep 03, 2024 12:48 PM Whitney O wrote: Reason for CRM: patient says dr zaida told her to call so he can put in a prescription for  Varenicline Tartrate, Starter, (CHANTIX STARTING MONTH PAK) 0.5 MG X 11 & 1 MG X 42 TBPK [facility] - [place], [place] - [address] [place] [address] Phone: [phone] Fax: [phone] Hours: Not open 24 hours

## 2024-09-12 ENCOUNTER — Other Ambulatory Visit: Payer: Self-pay | Admitting: Nurse Practitioner

## 2024-09-12 DIAGNOSIS — E782 Mixed hyperlipidemia: Secondary | ICD-10-CM

## 2024-09-12 DIAGNOSIS — E1165 Type 2 diabetes mellitus with hyperglycemia: Secondary | ICD-10-CM

## 2024-09-12 DIAGNOSIS — I10 Essential (primary) hypertension: Secondary | ICD-10-CM

## 2024-09-12 DIAGNOSIS — E1121 Type 2 diabetes mellitus with diabetic nephropathy: Secondary | ICD-10-CM

## 2024-09-12 DIAGNOSIS — K2100 Gastro-esophageal reflux disease with esophagitis, without bleeding: Secondary | ICD-10-CM

## 2024-09-15 ENCOUNTER — Other Ambulatory Visit: Payer: Self-pay | Admitting: Nurse Practitioner

## 2024-09-15 DIAGNOSIS — E782 Mixed hyperlipidemia: Secondary | ICD-10-CM

## 2024-09-15 DIAGNOSIS — E66811 Other obesity due to excess calories: Secondary | ICD-10-CM

## 2024-09-15 DIAGNOSIS — I10 Essential (primary) hypertension: Secondary | ICD-10-CM

## 2024-09-15 DIAGNOSIS — K2100 Gastro-esophageal reflux disease with esophagitis, without bleeding: Secondary | ICD-10-CM

## 2024-09-15 DIAGNOSIS — E1165 Type 2 diabetes mellitus with hyperglycemia: Secondary | ICD-10-CM

## 2024-09-15 DIAGNOSIS — E1169 Type 2 diabetes mellitus with other specified complication: Secondary | ICD-10-CM

## 2024-09-15 DIAGNOSIS — E1121 Type 2 diabetes mellitus with diabetic nephropathy: Secondary | ICD-10-CM

## 2024-09-15 DIAGNOSIS — G4733 Obstructive sleep apnea (adult) (pediatric): Secondary | ICD-10-CM

## 2024-10-21 ENCOUNTER — Other Ambulatory Visit: Payer: Self-pay | Admitting: Nurse Practitioner

## 2024-10-21 DIAGNOSIS — E782 Mixed hyperlipidemia: Secondary | ICD-10-CM

## 2024-10-21 DIAGNOSIS — E1121 Type 2 diabetes mellitus with diabetic nephropathy: Secondary | ICD-10-CM

## 2024-10-21 DIAGNOSIS — E1165 Type 2 diabetes mellitus with hyperglycemia: Secondary | ICD-10-CM

## 2024-10-21 DIAGNOSIS — I10 Essential (primary) hypertension: Secondary | ICD-10-CM

## 2024-10-21 DIAGNOSIS — Z0000 Encounter for general adult medical examination without abnormal findings: Secondary | ICD-10-CM

## 2024-10-30 ENCOUNTER — Ambulatory Visit: Payer: Self-pay

## 2024-10-30 NOTE — Telephone Encounter (Signed)
 FYI Only or Action Required?: Action required by provider: clinical question for provider, update on patient condition, and refused to schedule appt.  Patient was last seen in primary care on 07/04/2024 by Early, Camie BRAVO, NP.  Called Nurse Triage reporting Fall and Dizziness.  Symptoms began several weeks ago.  Interventions attempted: Ice/heat application.  Symptoms are: gradually worsening.  Triage Disposition: See PCP When Office is Open (Within 3 Days)  Patient/caregiver understands and will follow disposition?: Yes   Copied from CRM #[id number]. Topic: Clinical - Red Word Triage >> Oct 30, 2024  9:21 AM Darshell M wrote: Red Word that prompted transfer to Nurse Triage: Multiple falls over the last 2 months. Most recent last week. Dizziness, body-shaking, imbalance, increased heart rate. Thinks its related to Chantix , so discontinued taking. Hip pain, difficulty laying down.   Reason for Disposition  [1] MODERATE pain (e.g., interferes with normal activities, limping) AND [2] present > 3 days  Answer Assessment - Initial Assessment Questions Returned pt's call to discuss symptoms. Pt states that she is having ongoing L hip pain that makes it difficult to lay on her L side. Pt currently not taking any medications to relieve pain but using heat. Discussed falls and dizziness. Pt states that when she stands up too quick, her heart beats really quickly and her legs start to shake then she falls. Pt denies hitting head or LOC. Pt states symptoms come and go; currently not experiencing any distress, no increased HR, SOB or chest pain. Offered appt with PCP 12/31 for evaluation but pt reports she is leaving this evening for her honeymoon and she will not be back in town until February. Questioned if pt has gone to ED or had any imaging of L hip; pt denied. Discussed with pt going out of town to be cautious of dizziness and s/s of fainting. Pt voiced understanding. Pt has previous scheduled appt for  February that she plans to keep to f/u with provider.       1. LOCATION and RADIATION: Where is the pain located? Does the pain spread (shoot) anywhere else?     L hip  2. QUALITY: What does the pain feel like?  (e.g., sharp, dull, aching, burning)     Sore; difficulty laying down   3. SEVERITY: How bad is the pain? What does it keep you from doing?   (Scale 1-10; or mild, moderate, severe)     Moderate   4. ONSET: When did the pain start? Does it come and go, or is it there all the time?     Ongoing; comes and goes   7. AGGRAVATING FACTORS: What makes the hip pain worse? (e.g., walking, climbing stairs, running)     Laying on L side   8. OTHER SYMPTOMS: Do you have any other symptoms? (e.g., back pain, pain shooting down leg,  fever, rash)     None  Protocols used: Hip Pain-A-AH

## 2024-10-30 NOTE — Telephone Encounter (Signed)
 Patient reports, I am at another appointment, I will have to call you back in like 10 minutes.  Nurse Triage incomplete, nurse will place patient in call back folder.

## 2024-10-30 NOTE — Telephone Encounter (Signed)
 Copied from CRM [id number]. Topic: Clinical - Red Word Triage >> Oct 30, 2024  9:21 AM Darshell M wrote: Red Word that prompted transfer to Nurse Triage: Multiple falls over the last 2 months. Most recent last week. Dizziness, body-shaking, imbalance, increased heart rate. Thinks its related to Chantix , so discontinued taking. Hip pain, difficulty laying down.

## 2024-11-05 ENCOUNTER — Encounter

## 2024-11-06 ENCOUNTER — Ambulatory Visit: Payer: Self-pay | Admitting: Nurse Practitioner

## 2024-11-09 ENCOUNTER — Other Ambulatory Visit: Payer: Self-pay | Admitting: Medical Genetics

## 2024-11-12 ENCOUNTER — Ambulatory Visit

## 2024-11-14 ENCOUNTER — Other Ambulatory Visit: Payer: Self-pay | Admitting: Nurse Practitioner

## 2024-11-14 DIAGNOSIS — E782 Mixed hyperlipidemia: Secondary | ICD-10-CM

## 2024-11-14 DIAGNOSIS — K2100 Gastro-esophageal reflux disease with esophagitis, without bleeding: Secondary | ICD-10-CM

## 2024-11-14 DIAGNOSIS — E1121 Type 2 diabetes mellitus with diabetic nephropathy: Secondary | ICD-10-CM

## 2024-11-14 DIAGNOSIS — I10 Essential (primary) hypertension: Secondary | ICD-10-CM

## 2024-11-14 DIAGNOSIS — J4551 Severe persistent asthma with (acute) exacerbation: Secondary | ICD-10-CM

## 2024-11-14 DIAGNOSIS — E1165 Type 2 diabetes mellitus with hyperglycemia: Secondary | ICD-10-CM

## 2024-11-21 ENCOUNTER — Telehealth: Payer: Self-pay

## 2024-11-21 ENCOUNTER — Other Ambulatory Visit: Payer: Self-pay

## 2024-11-21 DIAGNOSIS — E1165 Type 2 diabetes mellitus with hyperglycemia: Secondary | ICD-10-CM

## 2024-11-21 DIAGNOSIS — Z0000 Encounter for general adult medical examination without abnormal findings: Secondary | ICD-10-CM

## 2024-11-21 DIAGNOSIS — J439 Emphysema, unspecified: Secondary | ICD-10-CM

## 2024-11-21 DIAGNOSIS — J4541 Moderate persistent asthma with (acute) exacerbation: Secondary | ICD-10-CM

## 2024-11-21 DIAGNOSIS — Z9109 Other allergy status, other than to drugs and biological substances: Secondary | ICD-10-CM

## 2024-11-21 DIAGNOSIS — I10 Essential (primary) hypertension: Secondary | ICD-10-CM

## 2024-11-21 DIAGNOSIS — E1121 Type 2 diabetes mellitus with diabetic nephropathy: Secondary | ICD-10-CM

## 2024-11-21 DIAGNOSIS — K2100 Gastro-esophageal reflux disease with esophagitis, without bleeding: Secondary | ICD-10-CM

## 2024-11-21 DIAGNOSIS — E1169 Type 2 diabetes mellitus with other specified complication: Secondary | ICD-10-CM

## 2024-11-21 DIAGNOSIS — E782 Mixed hyperlipidemia: Secondary | ICD-10-CM

## 2024-11-21 DIAGNOSIS — J4551 Severe persistent asthma with (acute) exacerbation: Secondary | ICD-10-CM

## 2024-11-21 MED ORDER — JARDIANCE 25 MG PO TABS: 25.0000 mg | ORAL_TABLET | Freq: Every day | ORAL | 3 refills | Status: AC

## 2024-11-21 MED ORDER — LEVOCETIRIZINE DIHYDROCHLORIDE 5 MG PO TABS: 5.0000 mg | ORAL_TABLET | Freq: Every evening | ORAL | 3 refills | Status: AC

## 2024-11-21 MED ORDER — BREZTRI AEROSPHERE 160-9-4.8 MCG/ACT IN AERO: 2.0000 | INHALATION_SPRAY | Freq: Two times a day (BID) | RESPIRATORY_TRACT | 3 refills | Status: AC

## 2024-11-21 MED ORDER — YUPELRI 175 MCG/3ML IN SOLN: 175.0000 ug | Freq: Every day | RESPIRATORY_TRACT | 11 refills | Status: AC

## 2024-11-21 MED ORDER — ATORVASTATIN CALCIUM 20 MG PO TABS: 20.0000 mg | ORAL_TABLET | Freq: Every day | ORAL | 3 refills | Status: AC

## 2024-11-21 MED ORDER — RABEPRAZOLE SODIUM 20 MG PO TBEC: 20.0000 mg | DELAYED_RELEASE_TABLET | Freq: Every day | ORAL | 3 refills | Status: AC

## 2024-11-21 MED ORDER — TELMISARTAN-HCTZ 40-12.5 MG PO TABS: 1.0000 | ORAL_TABLET | Freq: Every day | ORAL | 3 refills | Status: AC

## 2024-11-21 MED ORDER — IPRATROPIUM BROMIDE 0.02 % IN SOLN: 0.5000 mg | Freq: Four times a day (QID) | RESPIRATORY_TRACT | 12 refills | Status: AC | PRN

## 2024-11-21 MED ORDER — MONTELUKAST SODIUM 10 MG PO TABS: 10.0000 mg | ORAL_TABLET | Freq: Every day | ORAL | 3 refills | Status: AC

## 2024-11-21 MED ORDER — ALBUTEROL SULFATE 1.25 MG/3ML IN NEBU: 1.0000 | INHALATION_SOLUTION | Freq: Four times a day (QID) | RESPIRATORY_TRACT | 11 refills | Status: AC | PRN

## 2024-11-21 MED ORDER — BUDESONIDE 0.25 MG/2ML IN SUSP: 0.2500 mg | Freq: Every day | RESPIRATORY_TRACT | 5 refills | Status: AC

## 2024-11-21 MED ORDER — AIRSUPRA 90-80 MCG/ACT IN AERO: 2.0000 | INHALATION_SPRAY | Freq: Four times a day (QID) | RESPIRATORY_TRACT | 1 refills | Status: AC | PRN

## 2024-11-21 NOTE — Telephone Encounter (Signed)
 I have refilled all of her maintenance medications that we fill. If you could fill her controlled substances. Pt. Switching all of her meds to [facility].    Copied from CRM #[id number]. Topic: Clinical - Prescription Issue >> Nov 21, 2024  2:06 PM Berwyn MATSU wrote: Reason for CRM: Patient called in to advise that she needs new prescriptions. Per patient she needs new prescriptions for all her medications sent to the below pharmacy as she changed insurances as of 11/01/2024.   Please send to :  [facility] - [place], [place] - [address] [place] [address] Phone: [phone] Fax: [phone] Hours: Not open 24 hours  May you please assist.

## 2024-11-22 ENCOUNTER — Other Ambulatory Visit: Payer: Self-pay | Admitting: Nurse Practitioner

## 2024-11-22 DIAGNOSIS — R42 Dizziness and giddiness: Secondary | ICD-10-CM

## 2024-11-22 DIAGNOSIS — I10 Essential (primary) hypertension: Secondary | ICD-10-CM

## 2024-11-22 DIAGNOSIS — M5416 Radiculopathy, lumbar region: Secondary | ICD-10-CM

## 2024-11-22 DIAGNOSIS — E782 Mixed hyperlipidemia: Secondary | ICD-10-CM

## 2024-11-22 DIAGNOSIS — E1169 Type 2 diabetes mellitus with other specified complication: Secondary | ICD-10-CM

## 2024-11-22 DIAGNOSIS — E1165 Type 2 diabetes mellitus with hyperglycemia: Secondary | ICD-10-CM

## 2024-11-22 DIAGNOSIS — E1121 Type 2 diabetes mellitus with diabetic nephropathy: Secondary | ICD-10-CM

## 2024-11-22 DIAGNOSIS — G4733 Obstructive sleep apnea (adult) (pediatric): Secondary | ICD-10-CM

## 2024-11-22 DIAGNOSIS — E559 Vitamin D deficiency, unspecified: Secondary | ICD-10-CM

## 2024-11-22 DIAGNOSIS — K2100 Gastro-esophageal reflux disease with esophagitis, without bleeding: Secondary | ICD-10-CM

## 2024-11-22 DIAGNOSIS — G8921 Chronic pain due to trauma: Secondary | ICD-10-CM

## 2024-11-22 DIAGNOSIS — E6609 Other obesity due to excess calories: Secondary | ICD-10-CM

## 2024-11-22 MED ORDER — MELOXICAM 15 MG PO TABS: 15.0000 mg | ORAL_TABLET | Freq: Every day | ORAL | 3 refills | Status: AC

## 2024-11-22 MED ORDER — CYCLOBENZAPRINE HCL 10 MG PO TABS: 10.0000 mg | ORAL_TABLET | Freq: Every day | ORAL | 3 refills | Status: AC

## 2024-11-22 MED ORDER — AMITRIPTYLINE HCL 100 MG PO TABS: 100.0000 mg | ORAL_TABLET | Freq: Every day | ORAL | 3 refills | Status: AC

## 2024-11-22 MED ORDER — MECLIZINE HCL 25 MG PO TABS: 25.0000 mg | ORAL_TABLET | Freq: Three times a day (TID) | ORAL | 3 refills | Status: AC

## 2024-11-22 MED ORDER — GABAPENTIN 300 MG PO CAPS: ORAL_CAPSULE | ORAL | 3 refills | Status: AC

## 2024-11-22 MED ORDER — BACLOFEN 10 MG PO TABS: 30.0000 mg | ORAL_TABLET | Freq: Three times a day (TID) | ORAL | 3 refills | Status: AC

## 2024-11-22 MED ORDER — MOUNJARO 7.5 MG/0.5ML ~~LOC~~ SOAJ: 7.5000 mg | SUBCUTANEOUS | 1 refills | Status: AC

## 2024-11-29 ENCOUNTER — Other Ambulatory Visit (HOSPITAL_COMMUNITY): Payer: Self-pay

## 2024-11-30 ENCOUNTER — Other Ambulatory Visit (HOSPITAL_COMMUNITY): Payer: Self-pay

## 2024-11-30 ENCOUNTER — Telehealth: Payer: Self-pay

## 2024-11-30 NOTE — Telephone Encounter (Signed)
 Pharmacy Patient Advocate Encounter  Insurance verification completed.   The patient is insured through [facility]   Ran test claim for Gabapentin  300mg . Currently a quantity of 450 is a 30 day supply and the co-pay is . The current 30 day co-pay is, $0.00.  No PA needed at this time.  This test claim was processed through [facility]- copay amounts may vary at other pharmacies due to pharmacy/plan contracts, or as the patient moves through the different stages of their insurance plan.

## 2024-12-17 ENCOUNTER — Ambulatory Visit: Admitting: Nurse Practitioner

## 2024-12-27 ENCOUNTER — Encounter

## 2024-12-28 ENCOUNTER — Ambulatory Visit

## 2025-01-02 ENCOUNTER — Other Ambulatory Visit

## 2025-04-02 ENCOUNTER — Ambulatory Visit: Payer: Self-pay
